# Patient Record
Sex: Female | Born: 1935 | Race: Black or African American | Hispanic: No | Smoking: Never smoker
Health system: Southern US, Community
[De-identification: ages and names within clinical notes are randomized; demographics above are authoritative.]

## PROBLEM LIST (undated history)

## (undated) DIAGNOSIS — R42 Dizziness and giddiness: Secondary | ICD-10-CM

## (undated) DIAGNOSIS — N3281 Overactive bladder: Secondary | ICD-10-CM

## (undated) DIAGNOSIS — Z8739 Personal history of other diseases of the musculoskeletal system and connective tissue: Secondary | ICD-10-CM

## (undated) DIAGNOSIS — Z8673 Personal history of transient ischemic attack (TIA), and cerebral infarction without residual deficits: Secondary | ICD-10-CM

## (undated) DIAGNOSIS — D649 Anemia, unspecified: Secondary | ICD-10-CM

## (undated) DIAGNOSIS — M1990 Unspecified osteoarthritis, unspecified site: Secondary | ICD-10-CM

## (undated) DIAGNOSIS — N390 Urinary tract infection, site not specified: Secondary | ICD-10-CM

## (undated) DIAGNOSIS — M109 Gout, unspecified: Secondary | ICD-10-CM

## (undated) DIAGNOSIS — B9620 Unspecified Escherichia coli [E. coli] as the cause of diseases classified elsewhere: Secondary | ICD-10-CM

## (undated) DIAGNOSIS — I639 Cerebral infarction, unspecified: Secondary | ICD-10-CM

## (undated) DIAGNOSIS — M1008 Idiopathic gout, vertebrae: Secondary | ICD-10-CM

## (undated) DIAGNOSIS — E119 Type 2 diabetes mellitus without complications: Secondary | ICD-10-CM

## (undated) DIAGNOSIS — I10 Essential (primary) hypertension: Secondary | ICD-10-CM

## (undated) DIAGNOSIS — Z87898 Personal history of other specified conditions: Secondary | ICD-10-CM

## (undated) DIAGNOSIS — E785 Hyperlipidemia, unspecified: Secondary | ICD-10-CM

## (undated) DIAGNOSIS — K5900 Constipation, unspecified: Secondary | ICD-10-CM

## (undated) DIAGNOSIS — M5430 Sciatica, unspecified side: Secondary | ICD-10-CM

## (undated) DIAGNOSIS — N189 Chronic kidney disease, unspecified: Secondary | ICD-10-CM

## (undated) HISTORY — PX: ABDOMINAL HYSTERECTOMY: SHX81

---

## 2008-03-15 ENCOUNTER — Emergency Department (HOSPITAL_COMMUNITY): Admission: EM | Admit: 2008-03-15 | Discharge: 2008-03-15 | Payer: Self-pay | Admitting: Emergency Medicine

## 2008-03-19 ENCOUNTER — Emergency Department (HOSPITAL_COMMUNITY): Admission: EM | Admit: 2008-03-19 | Discharge: 2008-03-19 | Payer: Self-pay | Admitting: Emergency Medicine

## 2009-09-18 ENCOUNTER — Ambulatory Visit: Payer: Self-pay | Admitting: Orthopedic Surgery

## 2009-09-18 DIAGNOSIS — Q762 Congenital spondylolisthesis: Secondary | ICD-10-CM

## 2009-09-18 DIAGNOSIS — M5430 Sciatica, unspecified side: Secondary | ICD-10-CM

## 2009-09-18 DIAGNOSIS — M4120 Other idiopathic scoliosis, site unspecified: Secondary | ICD-10-CM | POA: Insufficient documentation

## 2009-09-18 DIAGNOSIS — M479 Spondylosis, unspecified: Secondary | ICD-10-CM | POA: Insufficient documentation

## 2010-03-18 NOTE — Letter (Signed)
Summary: *Orthopedic Consult Note  [facility]  [address] [place] [address]  [place], [place] [address]   Phone: [phone]  Fax: [phone]    Re:    [name] [name] [facility] [name] DOB:    03-03-1917   Dear: Robyne Peers    Thank you for requesting that we see the above patient for consultation.  A copy of the detailed office note will be sent under separate cover, for your review.  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  I would recommend that you treat her with pain medication as needed I would start off with a light pain relievers such as Tylenol with codeine monitor for constipation.  If this is unsuccessful then I would recommend perhaps and Ultracet.  If she continues to have problems an MRI of her back and referral to a neurosurgeon would be proper.       Thank you for this opportunity to look after your patient.  Sincerely,   Terrance Mass. MD.

## 2010-03-18 NOTE — Assessment & Plan Note (Signed)
Summary: LEFT HIP PAIN BRINGING XR FILM,NOTES/MCR/MCD/BSF   Vital Signs:  Patient profile:   77 year old female Height:      63 inches Weight:      159 pounds Pulse rate:   76 / minute Resp:     16 per minute  Visit Type:  new patient Referring Marjan Rosman:  [facility]. Med. Primary Giacomo Valone:  Heloise Beecham. Med.  CC:  hip pain.  History of Present Illness: 77 year old female complains of LEFT hip pain radiating from her back into her LEFT leg associated with weakness but no numbness.  She describes a throbbing aching pain which is 9/10 and came on suddenly.  She's had no physical therapy; she denies any injury; she has a history of osteoarthritis in her back; she did try some Relafen it didn't help. She had an injection in her arm and it hasn't helped   Xrays brought on disc of left hip from 09/17/09.  Meds: Atenolol, Lipitor, Relafen, Colcrys, Allopurinol, Detrol, Amlodipine, Lasix, ASA.    Allergies (verified): 1)  ! Penicillin  Past History:  Past Medical History: htn cholesterol gout  Past Surgical History: hysterectomy  Family History: Family History of Arthritis  Social History: Patient is widowed.  retired no smoking no alcohol 1 cup of caffeine per day 8th grade ed.  Review of Systems Constitutional:  Denies weight loss, weight gain, fever, chills, and fatigue. Cardiovascular:  Denies chest pain, palpitations, fainting, and murmurs. Respiratory:  Denies short of breath, wheezing, couch, tightness, pain on inspiration, and snoring . Gastrointestinal:  Denies heartburn, nausea, vomiting, diarrhea, constipation, and blood in your stools. Genitourinary:  Denies frequency, urgency, difficulty urinating, painful urination, flank pain, and bleeding in urine. Neurologic:  Denies numbness, tingling, unsteady gait, dizziness, tremors, and seizure. Musculoskeletal:  Complains of joint pain and swelling; denies instability, stiffness, redness, heat, and muscle  pain. Endocrine:  Denies excessive thirst, exessive urination, and heat or cold intolerance. Psychiatric:  Denies nervousness, depression, anxiety, and hallucinations. Skin:  Denies changes in the skin, poor healing, rash, itching, and redness. HEENT:  Denies blurred or double vision, eye pain, redness, and watering. Immunology:  Denies seasonal allergies, sinus problems, and allergic to bee stings. Hemoatologic:  Denies easy bleeding and brusing.  Physical Exam  Additional Exam:  I had to examine the patient and her chair she is a thin elderly female vital signs recorded on are stable  Her ulcer perfusion were normal there was no tenderness in the lower extremities  Her skin had multiple spots as an blockages but no rashes on the legs  She had normal sensation zero reflexes at the knee and ankle they were equal bilaterally she was awake alert and oriented x3  She ambulated with assistive device although normally she walks independently  She only had 90 of knee flexion significant crepitus and pain in both knees strength was normal in her lower extremities her knees were stable her hips are stable  there was also normal range of motion in both hips with no pain  Her lumbar spine was tender LEFT gluteal region was tender to straight leg raise seated position that was positive.  Lasegue's sign positive as well.   Impression & Recommendations:  Problem # 1:  SPONDYLOSIS (ICD-721.90) Assessment New  Orders: New Patient Level III ([address]) Lumbosacral Spine ,2/3 views (72100)  Problem # 2:  SPONDYLOLITHESIS (UEA-540.98) Assessment: New  Orders: New Patient Level III ([address]) Lumbosacral Spine ,2/3 views (72100)  Problem # 3:  SCOLIOSIS-IDIOPATHIC (ICD-737.30)  Assessment: New  Orders: New Patient Level III ([address]) Lumbosacral Spine ,2/3 views (72100)  Problem # 4:  SCIATICA (ICD-724.3) Assessment: New  radiographs were obtained of her lumbar spine and she brought a hip  x-ray with her which was normal  The lumbar spine film showed the scoliosis the spondylolisthesis the spondylosis and a degenerative disc disease with a grade 1 slip at L4 and 5  Orders: New Patient Level III ([address]) Lumbosacral Spine ,2/3 views (72100)  Patient Instructions: 1)  Refer back to primary care  2)  Rec neurontin 100 mg three times a day  3)  prednisone dose pack x 12 days

## 2010-03-18 NOTE — Letter (Signed)
Summary: History form  History form   Imported By: Jacklynn Ganong 09/23/2009 11:43:02  _____________________________________________________________________  External Attachment:    Type:   Image     Comment:   External Document

## 2011-02-19 ENCOUNTER — Encounter: Payer: Self-pay | Admitting: Emergency Medicine

## 2011-02-19 ENCOUNTER — Emergency Department (HOSPITAL_COMMUNITY): Payer: Medicare Other

## 2011-02-19 ENCOUNTER — Other Ambulatory Visit: Payer: Self-pay

## 2011-02-19 ENCOUNTER — Inpatient Hospital Stay (HOSPITAL_COMMUNITY)
Admission: EM | Admit: 2011-02-19 | Discharge: 2011-02-23 | DRG: 066 | Disposition: A | Discharge status: Skilled Nursing Facility | Payer: Medicare Other | Attending: Internal Medicine | Admitting: Internal Medicine

## 2011-02-19 DIAGNOSIS — R42 Dizziness and giddiness: Secondary | ICD-10-CM | POA: Diagnosis present

## 2011-02-19 DIAGNOSIS — E119 Type 2 diabetes mellitus without complications: Secondary | ICD-10-CM

## 2011-02-19 DIAGNOSIS — E1151 Type 2 diabetes mellitus with diabetic peripheral angiopathy without gangrene: Secondary | ICD-10-CM | POA: Diagnosis present

## 2011-02-19 DIAGNOSIS — E1169 Type 2 diabetes mellitus with other specified complication: Secondary | ICD-10-CM | POA: Diagnosis present

## 2011-02-19 DIAGNOSIS — R001 Bradycardia, unspecified: Secondary | ICD-10-CM | POA: Diagnosis present

## 2011-02-19 DIAGNOSIS — M109 Gout, unspecified: Secondary | ICD-10-CM | POA: Diagnosis present

## 2011-02-19 DIAGNOSIS — I6350 Cerebral infarction due to unspecified occlusion or stenosis of unspecified cerebral artery: Principal | ICD-10-CM | POA: Diagnosis present

## 2011-02-19 DIAGNOSIS — I498 Other specified cardiac arrhythmias: Secondary | ICD-10-CM | POA: Diagnosis present

## 2011-02-19 DIAGNOSIS — I999 Unspecified disorder of circulatory system: Secondary | ICD-10-CM | POA: Diagnosis present

## 2011-02-19 DIAGNOSIS — I639 Cerebral infarction, unspecified: Secondary | ICD-10-CM | POA: Diagnosis present

## 2011-02-19 DIAGNOSIS — I672 Cerebral atherosclerosis: Secondary | ICD-10-CM | POA: Diagnosis present

## 2011-02-19 DIAGNOSIS — Z8673 Personal history of transient ischemic attack (TIA), and cerebral infarction without residual deficits: Secondary | ICD-10-CM

## 2011-02-19 DIAGNOSIS — I10 Essential (primary) hypertension: Secondary | ICD-10-CM | POA: Diagnosis present

## 2011-02-19 HISTORY — DX: Unspecified osteoarthritis, unspecified site: M19.90

## 2011-02-19 HISTORY — DX: Idiopathic gout, vertebrae: M10.08

## 2011-02-19 HISTORY — DX: Type 2 diabetes mellitus without complications: E11.9

## 2011-02-19 HISTORY — DX: Essential (primary) hypertension: I10

## 2011-02-19 HISTORY — DX: Personal history of transient ischemic attack (TIA), and cerebral infarction without residual deficits: Z86.73

## 2011-02-19 LAB — COMPREHENSIVE METABOLIC PANEL
ALT: 70 U/L — ABNORMAL HIGH (ref 0–35)
AST: 68 U/L — ABNORMAL HIGH (ref 0–37)
Albumin: 4.4 g/dL (ref 3.5–5.2)
Alkaline Phosphatase: 134 U/L — ABNORMAL HIGH (ref 39–117)
BUN: 17 mg/dL (ref 6–23)
CO2: 27 mEq/L (ref 19–32)
Calcium: 10.3 mg/dL (ref 8.4–10.5)
Chloride: 104 mEq/L (ref 96–112)
Creatinine, Ser: 0.92 mg/dL (ref 0.50–1.10)
GFR calc Af Amer: 61 mL/min — ABNORMAL LOW (ref >90)
GFR calc non Af Amer: 53 mL/min — ABNORMAL LOW (ref >90)
Glucose, Bld: 139 mg/dL — ABNORMAL HIGH (ref 70–99)
Potassium: 3.8 mEq/L (ref 3.5–5.1)
Sodium: 139 mEq/L (ref 135–145)
Total Bilirubin: 0.7 mg/dL (ref 0.3–1.2)
Total Protein: 8.1 g/dL (ref 6.0–8.3)

## 2011-02-19 LAB — CARDIAC PANEL(CRET KIN+CKTOT+MB+TROPI)
CK, MB: 6.7 ng/mL (ref 0.3–4.0)
Relative Index: 3.1 — ABNORMAL HIGH (ref 0.0–2.5)
Total CK: 216 U/L — ABNORMAL HIGH (ref 7–177)
Troponin I: <0.3 ng/mL (ref <0.30)

## 2011-02-19 LAB — DIFFERENTIAL
Basophils Absolute: 0 10*3/uL (ref 0.0–0.1)
Basophils Relative: 0 % (ref 0–1)
Eosinophils Absolute: 0 10*3/uL (ref 0.0–0.7)
Neutro Abs: 3.6 10*3/uL (ref 1.7–7.7)
Neutrophils Relative %: 80 % — ABNORMAL HIGH (ref 43–77)
Smear Review: DECREASED

## 2011-02-19 LAB — CBC
HCT: 35.6 % — ABNORMAL LOW (ref 36.0–46.0)
Hemoglobin: 11.8 g/dL — ABNORMAL LOW (ref 12.0–15.0)
MCH: 27.6 pg (ref 26.0–34.0)
MCHC: 33.1 g/dL (ref 30.0–36.0)
MCV: 83.2 fL (ref 78.0–100.0)
Platelets: 115 10*3/uL — ABNORMAL LOW (ref 150–400)
RBC: 4.28 MIL/uL (ref 3.87–5.11)
RDW: 16.1 % — ABNORMAL HIGH (ref 11.5–15.5)
WBC: 4.6 10*3/uL (ref 4.0–10.5)

## 2011-02-19 MED ORDER — MECLIZINE HCL 12.5 MG PO TABS
25.0000 mg | ORAL_TABLET | Freq: Three times a day (TID) | ORAL | Status: DC | PRN
  Administered 2011-02-22 (×2): 25 mg via ORAL
  Filled 2011-02-19: qty 1
  Filled 2011-02-19: qty 2
  Filled 2011-02-19: qty 1

## 2011-02-19 MED ORDER — SODIUM CHLORIDE 0.9 % IV SOLN
Freq: Once | INTRAVENOUS | Status: AC
  Administered 2011-02-19: 12:00:00 via INTRAVENOUS

## 2011-02-19 MED ORDER — ASPIRIN 325 MG PO TABS
325.0000 mg | ORAL_TABLET | Freq: Every day | ORAL | Status: DC
  Administered 2011-02-19 – 2011-02-23 (×5): 325 mg via ORAL
  Filled 2011-02-19 (×5): qty 1

## 2011-02-19 MED ORDER — ENOXAPARIN SODIUM 40 MG/0.4ML ~~LOC~~ SOLN
40.0000 mg | SUBCUTANEOUS | Status: DC
  Administered 2011-02-19 – 2011-02-22 (×4): 40 mg via SUBCUTANEOUS
  Filled 2011-02-19 (×6): qty 0.4

## 2011-02-19 MED ORDER — GADOBENATE DIMEGLUMINE 529 MG/ML IV SOLN
15.0000 mL | Freq: Once | INTRAVENOUS | Status: AC | PRN
  Administered 2011-02-19: 15 mL via INTRAVENOUS

## 2011-02-19 MED ORDER — AMLODIPINE BESYLATE 5 MG PO TABS
10.0000 mg | ORAL_TABLET | Freq: Every day | ORAL | Status: DC
  Administered 2011-02-20 – 2011-02-23 (×4): 10 mg via ORAL
  Filled 2011-02-19 (×4): qty 2

## 2011-02-19 MED ORDER — FUROSEMIDE 20 MG PO TABS
20.0000 mg | ORAL_TABLET | Freq: Every day | ORAL | Status: DC
  Administered 2011-02-20 – 2011-02-23 (×4): 20 mg via ORAL
  Filled 2011-02-19 (×4): qty 1

## 2011-02-19 MED ORDER — BENAZEPRIL HCL 10 MG PO TABS
40.0000 mg | ORAL_TABLET | Freq: Every day | ORAL | Status: DC
  Administered 2011-02-20 – 2011-02-23 (×4): 40 mg via ORAL
  Filled 2011-02-19 (×4): qty 4
  Filled 2011-02-19: qty 1

## 2011-02-19 MED ORDER — ONDANSETRON HCL 4 MG/2ML IJ SOLN
4.0000 mg | Freq: Four times a day (QID) | INTRAMUSCULAR | Status: DC | PRN
  Administered 2011-02-19 – 2011-02-22 (×2): 4 mg via INTRAVENOUS
  Filled 2011-02-19 (×3): qty 2

## 2011-02-19 MED ORDER — SODIUM CHLORIDE 0.45 % IV SOLN
INTRAVENOUS | Status: AC
  Administered 2011-02-19: 18:00:00 via INTRAVENOUS

## 2011-02-19 MED ORDER — MECLIZINE HCL 12.5 MG PO TABS
25.0000 mg | ORAL_TABLET | Freq: Once | ORAL | Status: AC
  Administered 2011-02-19: 25 mg via ORAL
  Filled 2011-02-19: qty 2

## 2011-02-19 MED ORDER — CLOPIDOGREL BISULFATE 75 MG PO TABS
75.0000 mg | ORAL_TABLET | Freq: Every day | ORAL | Status: DC
  Administered 2011-02-19 – 2011-02-23 (×5): 75 mg via ORAL
  Filled 2011-02-19 (×5): qty 1

## 2011-02-19 MED ORDER — TRAMADOL HCL 50 MG PO TABS: 50.0000 mg | ORAL_TABLET | Freq: Four times a day (QID) | ORAL | Status: DC | PRN

## 2011-02-19 MED ORDER — TOLTERODINE TARTRATE ER 2 MG PO CP24
2.0000 mg | ORAL_CAPSULE | Freq: Every day | ORAL | Status: DC
  Administered 2011-02-20 – 2011-02-23 (×4): 2 mg via ORAL
  Filled 2011-02-19 (×6): qty 1

## 2011-02-19 NOTE — ED Notes (Signed)
Report given to [place] ,RN unit 300. Ready to accept patient.

## 2011-02-19 NOTE — ED Provider Notes (Addendum)
History   This chart was scribed for [name] Lennert, MD by Clarita Crane. The patient was seen in room APA10/APA10 and the patient's care was started at 10:57AM.   CSN: [id number]  Arrival date & time 02/19/11  1010   First MD Initiated Contact with Patient 02/19/11 1049      Chief Complaint  Patient presents with  . Dizziness  .     (Consider location/radiation/quality/duration/timing/severity/associated sxs/prior treatment) HPI [name] [name] is a 78 y.o. female who presents to the Emergency Department complaining of constant moderate dizziness described as the room spinning with associated nausea, vomiting onset last night and persistent since. Patient states she has had 2 episodes of vomiting since onset of symptoms. Reports dizziness is aggravated with standing. Denies abdominal pain, HA, chest pain, SOB. Patient with h/o HTN.   PCP-[place]  Past Medical History  Diagnosis Date  . Hypertension     Past Surgical History  Procedure Date  . Abdominal hysterectomy     No family history on file.  History  Substance Use Topics  . Smoking status: Never Smoker   . Smokeless tobacco: Not on file  . Alcohol Use: No    OB History    Grav Para Term Preterm Abortions TAB SAB Ect Mult Living                  Review of Systems  Constitutional: Negative for fatigue.  HENT: Negative for congestion, sinus pressure and ear discharge.   Eyes: Negative for discharge.  Respiratory: Negative for cough.   Cardiovascular: Negative for chest pain.  Gastrointestinal: Positive for nausea and vomiting. Negative for abdominal pain and diarrhea.  Genitourinary: Negative for frequency and hematuria.  Musculoskeletal: Negative for back pain.  Skin: Negative for rash.  Neurological: Positive for dizziness. Negative for seizures and headaches.  Hematological: Negative.   Psychiatric/Behavioral: Negative for hallucinations.    Allergies  Bee venom and Penicillins  Home Medications     Current Outpatient Rx  Name Route Sig Dispense Refill  . ALLOPURINOL 100 MG PO TABS Oral Take 300 mg by mouth at bedtime.      Marland Kitchen AMLODIPINE BESY-BENAZEPRIL HCL 10-40 MG PO CAPS Oral Take 1 capsule by mouth daily.      . ATENOLOL 100 MG PO TABS Oral Take 100 mg by mouth daily.      . COLCHICINE 0.6 MG PO TABS Oral Take 0.6 mg by mouth daily as needed. gout     . FUROSEMIDE 20 MG PO TABS Oral Take 20 mg by mouth daily.      . TOLTERODINE TARTRATE ER 2 MG PO CP24 Oral Take 2 mg by mouth daily.      . TRAMADOL HCL 50 MG PO TABS Oral Take 50 mg by mouth every 6 (six) hours as needed. pain       BP 149/55  Pulse 53  Temp(Src) 97.8 F (36.6 C) (Oral)  Resp 18  SpO2 98%  Physical Exam  Nursing note and vitals reviewed. Constitutional: She is oriented to person, place, and time. She appears well-developed and well-nourished. No distress.  HENT:  Head: Normocephalic and atraumatic.  Right Ear: External ear normal.       TMs normal bilaterally.   Eyes: EOM are normal. Pupils are equal, round, and reactive to light.  Neck: Neck supple. No tracheal deviation present.  Cardiovascular: Normal rate and regular rhythm.  Exam reveals no gallop and no friction rub.   No murmur heard. Pulmonary/Chest: Effort  normal. No respiratory distress. She has no wheezes. She has no rales.  Abdominal: Soft. Bowel sounds are normal. She exhibits no distension. There is no tenderness.  Musculoskeletal: Normal range of motion. She exhibits no tenderness.  Neurological: She is alert and oriented to person, place, and time. No sensory deficit.  Skin: Skin is warm and dry.  Psychiatric: She has a normal mood and affect. Her behavior is normal.    ED Course  Procedures (including critical care time)  DIAGNOSTIC STUDIES: Oxygen Saturation is 98% on room air, normal by my interpretation.    COORDINATION OF CARE: 11:01AM- Patient and family members informed of current plan for treatment.  1:51PM- Patient and  family members informed of lab and imaging results. Patient states dizziness is still persistent following administration of IVFs and 25mg  Antivert PO. Will consult with PCP/hospitalist for admission.   Labs Reviewed  CBC - Abnormal; Notable for the following:    Hemoglobin 11.8 (*)    HCT 35.6 (*)    RDW 16.1 (*)    Platelets 115 (*) PLATELET COUNT CONFIRMED BY SMEAR   All other components within normal limits  DIFFERENTIAL - Abnormal; Notable for the following:    Neutrophils Relative 80 (*)    Lymphs Abs 0.6 (*)    All other components within normal limits  COMPREHENSIVE METABOLIC PANEL - Abnormal; Notable for the following:    Glucose, Bld 139 (*)    AST 68 (*)    ALT 70 (*)    Alkaline Phosphatase 134 (*)    GFR calc non Af Amer 53 (*)    GFR calc Af Amer 61 (*)    All other components within normal limits  CARDIAC PANEL(CRET KIN+CKTOT+MB+TROPI) - Abnormal; Notable for the following:    Total CK 216 (*)    CK, MB 6.7 (*)    Relative Index 3.1 (*)    All other components within normal limits   Dg Chest 1 View  02/19/2011  *RADIOLOGY REPORT*  Clinical Data: Dizziness  CHEST - 1 VIEW  Comparison: None.  Findings: Borderline cardiomegaly noted.  No acute infiltrate or pleural effusion.  No pulmonary edema.  Atherosclerotic calcifications of thoracic aorta. Mild elevation of the right hemidiaphragm.  IMPRESSION: Borderline cardiomegaly.  No active disease.  Original Report Authenticated By: Natasha Mead, M.D.   Ct Head Wo Contrast  02/19/2011  *RADIOLOGY REPORT*  Clinical Data: Nausea vomiting with dizziness since last night. High blood pressure.  CT HEAD WITHOUT CONTRAST  Technique:  Contiguous axial images were obtained from the base of the skull through the vertex without contrast.  Comparison: None.  Findings: Moderate sized acute/subacute non hemorrhagic right cerebellar infarct involving the mid to inferior aspect of the right cerebellum.  Presently no associated hemorrhage or  significant mass effect however as this progresses the patient is at risk for these complications.  Prominent small vessel disease type changes.  No intracranial mass lesion detected on this unenhanced exam.  Global atrophy without hydrocephalus.  Moderate mucosal thickening right maxillary sinus with wall thickening suggesting chronic sinusitis.  Vascular calcifications.  IMPRESSION: Moderate sized acute/subacute non hemorrhagic right cerebellar infarct as noted above.  Critical Value/emergent results were called by telephone at the time of interpretation on 02/19/2011 at  at 12:34 p.m.  to  Dr. Estell Harpin., who verbally acknowledged these results.  Original Report Authenticated By: Fuller Canada, M.D.     1. Stroke       MDM      Date:  02/19/2011  Rate: 57  Rhythm: normal sinus rhythm  QRS Axis: left  Intervals: normal  ST/T Wave abnormalities: normal  Conduction Disutrbances:left bundle branch block  Narrative Interpretation:   Old EKG Reviewed: none available     The chart was scribed for me under my direct supervision.  I personally performed the history, physical, and medical decision making and all procedures in the evaluation of this patient.[name] Lennert, MD 02/19/11 1413  [name] Lennert, MD 02/19/11 [phone]

## 2011-02-19 NOTE — H&P (Signed)
PCP:   Reynolds Bowl, MD   Chief Complaint:  Dizziness  HPI: Patient is a 78 year old African American female past medical history of hypertension who woke up last night complaining of dizziness. She described it as lightheadedness with room spinning like sensation. It was worse when she sat up or tried to stand and better when she laid back down. She then went back to sleep and when she woke up this morning her symptoms persisted. She was feeling quite rough and when nothing changed, she went to the emergency room.  The emergency room she was noted to have relatively stable vitals with her blood pressure being mild to moderately elevated in the 140s to 150s. A CT scan of the head noted moderate sized acute/subacute nonhemorrhagic right cerebellar infarct. At this point it was felt best that the patient be admitted and further workup. [facility] hospitalists were called for admission. I've ordered an MRI and the patient was sent upstairs. Patient underwent her MRI which is return back confirming the moderate size infarct in the posterior inferior cerebral artery distribution but also concerning for a tiny acute nonhemorrhagic infarct in the posterior right opercular region raising the possibility of embolic disease. The MRA that was done was most noteworthy for a high grade focal stenosis of the distal A1 segment of the left ACA and mild to moderate narrowing of the proximal posterior cerebral artery and moderate to markedly tandem stenoses of the left PICA.  Review of Systems:  I saw the patient in her room, to some very tired and complained of dizziness which she described as room spinning when she was sitting up. Otherwise she is doing okay. She denied any headache, vision changes, dysphasia, chest pain, palpitations, shortness of breath, wheeze, cough, abdominal pain, hematuria, dysuria, constipation, diarrhea, focal extremity numbness or weakness or pain. Review systems otherwise negative. She describes  the dizziness as room spinning when she tries to sit up, worse with standing.  Past Medical History: Past Medical History  Diagnosis Date  . Hypertension   . Arthritis   . Gout spondylitis    Past Surgical History  Procedure Date  . Abdominal hysterectomy     Medications: Prior to Admission medications   Medication Sig Start Date End Date Taking? Authorizing Provider  allopurinol (ZYLOPRIM) 100 MG tablet Take 300 mg by mouth at bedtime.     Yes Historical Provider, MD  amLODipine-benazepril (LOTREL) 10-40 MG per capsule Take 1 capsule by mouth daily.     Yes Historical Provider, MD  atenolol (TENORMIN) 100 MG tablet Take 100 mg by mouth daily.     Yes Historical Provider, MD  colchicine 0.6 MG tablet Take 0.6 mg by mouth daily as needed. gout    Yes Historical Provider, MD  furosemide (LASIX) 20 MG tablet Take 20 mg by mouth daily.     Yes Historical Provider, MD  tolterodine (DETROL LA) 2 MG 24 hr capsule Take 2 mg by mouth daily.     Yes Historical Provider, MD  traMADol (ULTRAM) 50 MG tablet Take 50 mg by mouth every 6 (six) hours as needed. pain    Yes Historical Provider, MD    Allergies:   Allergies  Allergen Reactions  . Bee Venom   . Penicillins     Social History:  reports that she has never smoked. She has never used smokeless tobacco. She reports that she does not drink alcohol or use illicit drugs. The patient is normally at baseline able to participate in almost all  activities of daily living using a cane or walker. She lives at home with 2 of her sons.  Family History: Her dad had a stroke as did one of her brothers.  Physical Exam: Filed Vitals:   02/19/11 1200 02/19/11 1300 02/19/11 1400 02/19/11 1706  BP: 149/55 153/61 150/56 139/69  Pulse: 53 58 65 57  Temp:    97.6 F (36.4 C)  TempSrc:    Oral  Resp:  20 22 18   Height:    5\' 2"  (1.575 m)  Weight:    64.5 kg (142 lb 3.2 oz)  SpO2: 98% 96% 97% 91%   General: Alert and oriented x3, in no acute  distress, looks stated age, fatigued HEENT: Normocephalic, atraumatic, mucous membranes are dry, cranial nerves II through XII are intact Cardiovascular: Regular rate and rhythm, S1-S2, soft 2/6 holosystolic murmur Lungs: Clear to auscultation bilaterally Abdomen: Soft, nontender, nondistended, positive bowel sounds Extremities: No clubbing or cyanosis or edema. Neurological: Flexion extension and grip looked to be symmetric with no focal deficit. Upper is almost fully intact with 5 out of 5 bilaterally. Lower has about a 5 minus relatively equal. She has some downgoing of the left foot and it looks to be more of a localized foot drop which she said happened a few years ago. She attributed this to an old stroke, but her previous CT and MRI showed no evidence of an old stroke. She has downgoing Babinski's. Sensation appears intact. No dysmetria. Overall no other focal neurological deficits. When I sat her up, she notes some mild dizziness although overall it is improved from before. Standing really truly causes her room spinning like symptoms. I'm not able to elicit any nystagmus.   Labs on Admission:   [facility] 02/19/11 1133  NA 139  K 3.8  CL 104  CO2 27  GLUCOSE 139*  BUN 17  CREATININE 0.92  CALCIUM 10.3  MG --  PHOS --    Basename 02/19/11 1133  AST 68*  ALT 70*  ALKPHOS 134*  BILITOT 0.7  PROT 8.1  ALBUMIN 4.4    Basename 02/19/11 1133  WBC 4.6  NEUTROABS 3.6  HGB 11.8*  HCT 35.6*  MCV 83.2  PLT 115*    Basename 02/19/11 1133  CKTOTAL 216*  CKMB 6.7*  CKMBINDEX --  TROPONINI <0.30   Radiological Exams on Admission: Dg Chest 1 View 02/19/2011   IMPRESSION: Borderline cardiomegaly.  No active disease.     Ct Head Wo Contrast 02/19/2011   IMPRESSION: Moderate sized acute/subacute non hemorrhagic right cerebellar infarct as noted above.  Critical Value/emergent results were called by telephone at the time of interpretation on 02/19/2011 at  at 12:34 p.m.  to  Dr.  Estell Harpin., who verbally acknowledged these results.   Mr Angiogram and MRI of Head Wo Contrast 02/19/2011  MRI of head  IMPRESSION: Moderate size acute/subacute non hemorrhagic right mid to inferior cerebellar infarct (PICA distribution).  Local mass effect with mild narrowing of the fourth ventricle.  No hydrocephalus.  Tiny acute non hemorrhagic infarct posterior right opercular region.  This raises possibility of embolic disease.  Mild hemosiderin staining left parietal lobe may be related to the prior infarct or trauma.  Mild to slightly moderate small vessel disease type changes.  Global atrophy.  Right maxillary sinus moderate mucosal thickening.    MRA HEAD  Findings: Motion degraded exam.  Moderate irregularity with areas of narrowing and dilation involving portions of the pre cavernous, cavernous and supraclinoid aspect of the  internal carotid artery bilaterally.  High-grade focal stenosis distal A1 segment of the left anterior cerebral artery.  Middle cerebral artery moderate branch vessel irregularity.  Ectatic vertebral arteries and basilar artery.  Artifact versus narrowing of the distal vertebral arteries bilaterally.  Nonvisualization right PICA consistent with the patient's acute infarct.  Moderate to marked tandem stenoses left PICA.  Nonvisualization AICAs.  Moderate narrowing irregularity of the superior cerebellar artery bilaterally.  Moderate to marked narrowing proximal posterior cerebral artery. Moderate narrowing and  irregularity distal aspect of the posterior cerebral arteries bilaterally  No saccular aneurysm.  IMPRESSION: Prominent intracranial atherosclerotic type changes as detailed above including nonvisualization of the right PICA corresponding to the patient's above described acute/subacute right cerebellar infarct.  Original Report Authenticated By: Fuller Canada, M.D.   Assessment/Plan Present on Admission:  .HTN (hypertension): Continue all the medications except beta  blocker. See below.  .Dizziness: Felt to be secondary to cerebellar infarct. When necessary Antivert. Symptoms seem to be slightly improving. Ask physical and occupational to evaluate.  .Cerebellar infarct: MRI and MRA results review. We'll discuss with neurology. Concerned about the possibility of embolic disease and we'll check carotid Dopplers and echocardiogram. Even though this is not reflected in her home medications, she is on daily aspirin normally. Have added Plavix to this.  .Bradycardia: Checking a TSH. Holding her beta blocker.  After discussion with the patient, she is to be a full code.  We will respect these wishes.  I anticipate her length of stay to be 1-2 days based on findings, although her stenoses may lead to further intervention.  Time spent on this patient including examination and decision-making process: 55 minutes.  Hollice Espy [phone] 02/19/2011, 6:01 PM

## 2011-02-19 NOTE — ED Notes (Signed)
Patient c/o nausea and vomiting that started last night. Denies abdominal pain. Denies fever.

## 2011-02-19 NOTE — ED Notes (Signed)
Patient unable to do orthostatic v/s standing - patient stated she was too dizzy to stand

## 2011-02-19 NOTE — ED Notes (Signed)
Critical CKMB of 6.7 reported to MD

## 2011-02-19 NOTE — ED Notes (Signed)
Patient assisted with bathroom - bedpan used due to dizziness.

## 2011-02-19 NOTE — ED Notes (Signed)
Patient in MRI 

## 2011-02-20 ENCOUNTER — Inpatient Hospital Stay (HOSPITAL_COMMUNITY): Payer: Medicare Other

## 2011-02-20 DIAGNOSIS — I059 Rheumatic mitral valve disease, unspecified: Secondary | ICD-10-CM

## 2011-02-20 LAB — GLUCOSE, CAPILLARY
Glucose-Capillary: 130 mg/dL — ABNORMAL HIGH (ref 70–99)
Glucose-Capillary: 157 mg/dL — ABNORMAL HIGH (ref 70–99)
Glucose-Capillary: 87 mg/dL (ref 70–99)

## 2011-02-20 LAB — RAPID URINE DRUG SCREEN, HOSP PERFORMED
Barbiturates: NOT DETECTED
Benzodiazepines: NOT DETECTED
Cocaine: NOT DETECTED
Tetrahydrocannabinol: NOT DETECTED

## 2011-02-20 LAB — LIPID PANEL: Cholesterol: 100 mg/dL (ref 0–200)

## 2011-02-20 NOTE — Progress Notes (Signed)
*  PRELIMINARY RESULTS* Echocardiogram 2D Echocardiogram has been performed.  [name] [name] 02/20/2011, 11:32 AM

## 2011-02-20 NOTE — Progress Notes (Signed)
Subjective: Patient is feeling better. She still complains of some dizziness, much more noticeable when she stands up (such as today when she was working with physical therapy). Overall she does note that her dizziness is better than it was yesterday. She has no other complaints.  Objective: Weight change:   Intake/Output Summary (Last 24 hours) at 02/20/11 1441 Last data filed at 02/20/11 0440  Gross per 24 hour  Intake      0 ml  Output   1300 ml  Net  -1300 ml   BP 128/68  Pulse 66  Temp(Src) 97.8 F (36.6 C) (Oral)  Resp 18  Ht 5\' 2"  (1.575 m)  Wt 64.5 kg (142 lb 3.2 oz)  BMI 26.01 kg/m2  SpO2 92% General: Alert and oriented x3, no apparent distress, looks younger than stated age HEENT: Normocephalic, atraumatic, mucous membranes are moist Cardiovascular: Regular rate and rhythm, S1, S2, 2/6 systolic ejection murmur Lungs: Clear to auscultation Bilaterally Abdomen: Soft, nontender, nondistended, positive bowel sounds Extremities: No clubbing or cyanosis or edema Neurological: No focal deficits. Lab Results: Basic Metabolic Panel:  Basename 02/19/11 1133  NA 139  K 3.8  CL 104  CO2 27  GLUCOSE 139*  BUN 17  CREATININE 0.92  CALCIUM 10.3  MG --  PHOS --   Liver Function Tests:  Basename 02/19/11 1133  AST 68*  ALT 70*  ALKPHOS 134*  BILITOT 0.7  PROT 8.1  ALBUMIN 4.4   CBC:  Basename 02/19/11 1133  WBC 4.6  NEUTROABS 3.6  HGB 11.8*  HCT 35.6*  MCV 83.2  PLT 115*   Cardiac Enzymes:  Basename 02/19/11 1133  CKTOTAL 216*  CKMB 6.7*  CKMBINDEX --  TROPONINI <0.30   CBG:  Basename 02/20/11 1125 02/20/11 0812 02/20/11 0045  GLUCAP 178* 88 87   Hemoglobin A1C:  Basename 02/19/11 1133  HGBA1C 6.6*   Fasting Lipid Panel:  Basename 02/20/11 0542  CHOL 100  HDL 55  LDLCALC 30  TRIG 77  CHOLHDL 1.8  LDLDIRECT --   Thyroid Function Tests:  Basename 02/19/11 1133  TSH 0.216*  T4TOTAL --  FREET4 --  T3FREE --  THYROIDAB --    Ct  Head Wo Contrast 02/20/2011  *RADIOLOGY REPORT*  Clinical Data: Followup infarct. IMPRESSION: Expected evolution of right cerebellar infarct with mild increased associated mass effect upon the surrounding structures including the fourth ventricle.  No evidence of hydrocephalus or intracranial hemorrhage. Ct Head Wo Contrast  Medications: Scheduled Meds:   . sodium chloride   Intravenous STAT  . amLODipine  10 mg Oral Daily  . aspirin  325 mg Oral Daily  . benazepril  40 mg Oral Daily  . clopidogrel  75 mg Oral Q breakfast  . enoxaparin (LOVENOX) injection  40 mg Subcutaneous Q24H  . furosemide  20 mg Oral Daily  . tolterodine  2 mg Oral Daily   Continuous Infusions:  PRN Meds:.gadobenate dimeglumine, meclizine, ondansetron (ZOFRAN) IV, traMADol  Assessment/Plan: Patient Active Hospital Problem List: Cerebellar infarct (02/19/2011)  appreciate neurology followup. Monitor patient closely. So far. The only residual effect is her vertigo. No muscular skeletal or muscle weakness. She is somewhat overall decondition with her dizziness, physical therapy is recommending short-term skilled nursing. The patient is somewhat resistant, but her family want what's best for her and is one to convince her to do so. Will likely check a repeat head CT on Sunday. Patient was already on aspirin and we added Plavix.  HTN (hypertension) (02/19/2011)  well-controlled.  Continue Norvasc and benazepril. 10 Lasix.  Dizziness: Secondary to cerebellar infarct. Continue when necessary Antivert.  Bradycardia: Discontinued her beta blocker. Her heart rate has remained in the 60s. And stable.  LOS: 1 day   Hollice Espy 02/20/2011, 2:41 PM

## 2011-02-20 NOTE — Consult Note (Signed)
Reason for Consult: Referring Physician:  [name] is an 78 y.o. female.  HPI: See dictation  Past Medical History  Diagnosis Date  . Hypertension   . Arthritis   . Gout spondylitis     Past Surgical History  Procedure Date  . Abdominal hysterectomy     History reviewed. No pertinent family history.  Social History:  reports that she has never smoked. She has never used smokeless tobacco. She reports that she does not drink alcohol or use illicit drugs.  Allergies:  Allergies  Allergen Reactions  . Bee Venom   . Penicillins     Medications:  Prior to Admission medications   Medication Sig Start Date End Date Taking? Authorizing Provider  allopurinol (ZYLOPRIM) 100 MG tablet Take 300 mg by mouth at bedtime.     Yes Historical Provider, MD  amLODipine-benazepril (LOTREL) 10-40 MG per capsule Take 1 capsule by mouth daily.     Yes Historical Provider, MD  atenolol (TENORMIN) 100 MG tablet Take 100 mg by mouth daily.     Yes Historical Provider, MD  colchicine 0.6 MG tablet Take 0.6 mg by mouth daily as needed. gout    Yes Historical Provider, MD  furosemide (LASIX) 20 MG tablet Take 20 mg by mouth daily.     Yes Historical Provider, MD  tolterodine (DETROL LA) 2 MG 24 hr capsule Take 2 mg by mouth daily.     Yes Historical Provider, MD  traMADol (ULTRAM) 50 MG tablet Take 50 mg by mouth every 6 (six) hours as needed. pain    Yes Historical Provider, MD   Scheduled Meds:   . sodium chloride   Intravenous STAT  . sodium chloride   Intravenous Once  . amLODipine  10 mg Oral Daily  . aspirin  325 mg Oral Daily  . benazepril  40 mg Oral Daily  . clopidogrel  75 mg Oral Q breakfast  . enoxaparin (LOVENOX) injection  40 mg Subcutaneous Q24H  . furosemide  20 mg Oral Daily  . meclizine  25 mg Oral Once  . tolterodine  2 mg Oral Daily   Continuous Infusions:  PRN Meds:.gadobenate dimeglumine, meclizine, ondansetron (ZOFRAN) IV, traMADol   Results for orders placed  during the hospital encounter of 02/19/11 (from the past 48 hour(s))  CBC     Status: Abnormal   Collection Time   02/19/11 11:33 AM      Component Value Range Comment   WBC 4.6  4.0 - 10.5 (K/uL)    RBC 4.28  3.87 - 5.11 (MIL/uL)    Hemoglobin 11.8 (*) 12.0 - 15.0 (g/dL)    HCT 78.4 (*) 69.6 - 46.0 (%)    MCV 83.2  78.0 - 100.0 (fL)    MCH 27.6  26.0 - 34.0 (pg)    MCHC 33.1  30.0 - 36.0 (g/dL)    RDW 29.5 (*) 28.4 - 15.5 (%)    Platelets 115 (*) 150 - 400 (K/uL) PLATELET COUNT CONFIRMED BY SMEAR  DIFFERENTIAL     Status: Abnormal   Collection Time   02/19/11 11:33 AM      Component Value Range Comment   Neutrophils Relative 80 (*) 43 - 77 (%)    Neutro Abs 3.6  1.7 - 7.7 (K/uL)    Lymphocytes Relative 14  12 - 46 (%)    Lymphs Abs 0.6 (*) 0.7 - 4.0 (K/uL)    Monocytes Relative 4  3 - 12 (%)    Monocytes Absolute  0.2  0.1 - 1.0 (K/uL)    Eosinophils Relative 0  0 - 5 (%)    Eosinophils Absolute 0.0  0.0 - 0.7 (K/uL)    Basophils Relative 0  0 - 1 (%)    Basophils Absolute 0.0  0.0 - 0.1 (K/uL)    Smear Review PLATELETS APPEAR DECREASED     COMPREHENSIVE METABOLIC PANEL     Status: Abnormal   Collection Time   02/19/11 11:33 AM      Component Value Range Comment   Sodium 139  135 - 145 (mEq/L)    Potassium 3.8  3.5 - 5.1 (mEq/L)    Chloride 104  96 - 112 (mEq/L)    CO2 27  19 - 32 (mEq/L)    Glucose, Bld 139 (*) 70 - 99 (mg/dL)    BUN 17  6 - 23 (mg/dL)    Creatinine, Ser 1.61  0.50 - 1.10 (mg/dL)    Calcium 09.6  8.4 - 10.5 (mg/dL)    Total Protein 8.1  6.0 - 8.3 (g/dL)    Albumin 4.4  3.5 - 5.2 (g/dL)    AST 68 (*) 0 - 37 (U/L)    ALT 70 (*) 0 - 35 (U/L)    Alkaline Phosphatase 134 (*) 39 - 117 (U/L)    Total Bilirubin 0.7  0.3 - 1.2 (mg/dL)    GFR calc non Af Amer 53 (*) >90 (mL/min)    GFR calc Af Amer 61 (*) >90 (mL/min)   CARDIAC PANEL(CRET KIN+CKTOT+MB+TROPI)     Status: Abnormal   Collection Time   02/19/11 11:33 AM      Component Value Range Comment   Total CK  216 (*) 7 - 177 (U/L)    CK, MB 6.7 (*) 0.3 - 4.0 (ng/mL)    Troponin I <0.30  <0.30 (ng/mL)    Relative Index 3.1 (*) 0.0 - 2.5    HEMOGLOBIN A1C     Status: Abnormal   Collection Time   02/19/11 11:33 AM      Component Value Range Comment   Hemoglobin A1C 6.6 (*) <5.7 (%)    Mean Plasma Glucose 143 (*) <117 (mg/dL)   TSH     Status: Abnormal   Collection Time   02/19/11 11:33 AM      Component Value Range Comment   TSH 0.216 (*) 0.350 - 4.500 (uIU/mL)   GLUCOSE, CAPILLARY     Status: Normal   Collection Time   02/20/11 12:45 AM      Component Value Range Comment   Glucose-Capillary 87  70 - 99 (mg/dL)    Comment 1 Notify RN     LIPID PANEL     Status: Normal   Collection Time   02/20/11  5:42 AM      Component Value Range Comment   Cholesterol 100  0 - 200 (mg/dL)    Triglycerides 77  <045 (mg/dL)    HDL 55  >40 (mg/dL)    Total CHOL/HDL Ratio 1.8      VLDL 15  0 - 40 (mg/dL)    LDL Cholesterol 30  0 - 99 (mg/dL)   URINE RAPID DRUG SCREEN (HOSP PERFORMED)     Status: Normal   Collection Time   02/20/11  5:49 AM      Component Value Range Comment   Opiates NONE DETECTED  NONE DETECTED     Cocaine NONE DETECTED  NONE DETECTED     Benzodiazepines NONE DETECTED  NONE DETECTED  Amphetamines NONE DETECTED  NONE DETECTED     Tetrahydrocannabinol NONE DETECTED  NONE DETECTED     Barbiturates NONE DETECTED  NONE DETECTED      Dg Chest 1 View  02/19/2011  *RADIOLOGY REPORT*  Clinical Data: Dizziness  CHEST - 1 VIEW  Comparison: None.  Findings: Borderline cardiomegaly noted.  No acute infiltrate or pleural effusion.  No pulmonary edema.  Atherosclerotic calcifications of thoracic aorta. Mild elevation of the right hemidiaphragm.  IMPRESSION: Borderline cardiomegaly.  No active disease.  Original Report Authenticated By: Natasha Mead, M.D.   Ct Head Wo Contrast  02/19/2011  *RADIOLOGY REPORT*  Clinical Data: Nausea vomiting with dizziness since last night. High blood pressure.  CT HEAD  WITHOUT CONTRAST  Technique:  Contiguous axial images were obtained from the base of the skull through the vertex without contrast.  Comparison: None.  Findings: Moderate sized acute/subacute non hemorrhagic right cerebellar infarct involving the mid to inferior aspect of the right cerebellum.  Presently no associated hemorrhage or significant mass effect however as this progresses the patient is at risk for these complications.  Prominent small vessel disease type changes.  No intracranial mass lesion detected on this unenhanced exam.  Global atrophy without hydrocephalus.  Moderate mucosal thickening right maxillary sinus with wall thickening suggesting chronic sinusitis.  Vascular calcifications.  IMPRESSION: Moderate sized acute/subacute non hemorrhagic right cerebellar infarct as noted above.  Critical Value/emergent results were called by telephone at the time of interpretation on 02/19/2011 at  at 12:34 p.m.  to  Dr. Estell Harpin., who verbally acknowledged these results.  Original Report Authenticated By: Fuller Canada, M.D.   Mr Angiogram Head Wo Contrast  02/19/2011  *RADIOLOGY REPORT*  Clinical Data:  Dizziness with nausea and vomiting.  MRI HEAD WITH AND WITHOUT CONTRAST MRA HEAD WITHOUT CONTRAST  Technique: Multiplanar, multiecho pulse sequences of the brain and surrounding structures were obtained according to standard protocol with and without intravenous contrast.  Angiographic images of the Circle of Willis were obtained using MRA technique without intravenous contrast.  Contrast: 14 ml MultiHance.  Comparison01/04/2011 CT.  No comparison MR.  MRI HEAD  Findings: Moderate size acute/subacute non hemorrhagic right mid to inferior cerebellar infarct (PICA distribution).  Local mass effect with mild narrowing of the fourth ventricle.  No hydrocephalus.  Tiny acute non hemorrhagic infarct posterior right opercular region.  This raises possibility of embolic disease.  Mild hemosiderin staining left  parietal lobe may be related to the prior infarct or trauma.  Scattered tiny areas of blood breakdown products may be related to episode hemorrhagic ischemia without large blood collection.  Mild to slightly moderate small vessel disease type changes.  Global atrophy.  No intracranial mass lesions or abnormal enhancement.  Transverse ligament hypertrophy.  Mild spinal stenosis C4-5 and C5- 6.  Right maxillary sinus moderate mucosal thickening.  IMPRESSION: Moderate size acute/subacute non hemorrhagic right mid to inferior cerebellar infarct (PICA distribution).  Local mass effect with mild narrowing of the fourth ventricle.  No hydrocephalus.  Tiny acute non hemorrhagic infarct posterior right opercular region.  This raises possibility of embolic disease.  Mild hemosiderin staining left parietal lobe may be related to the prior infarct or trauma.  Mild to slightly moderate small vessel disease type changes.  Global atrophy.  Right maxillary sinus moderate mucosal thickening.  MRA HEAD  Findings: Motion degraded exam.  Moderate irregularity with areas of narrowing and dilation involving portions of the pre cavernous, cavernous and supraclinoid aspect of the internal carotid  artery bilaterally.  High-grade focal stenosis distal A1 segment of the left anterior cerebral artery.  Middle cerebral artery moderate branch vessel irregularity.  Ectatic vertebral arteries and basilar artery.  Artifact versus narrowing of the distal vertebral arteries bilaterally.  Nonvisualization right PICA consistent with the patient's acute infarct.  Moderate to marked tandem stenoses left PICA.  Nonvisualization AICAs.  Moderate narrowing irregularity of the superior cerebellar artery bilaterally.  Moderate to marked narrowing proximal posterior cerebral artery. Moderate narrowing and  irregularity distal aspect of the posterior cerebral arteries bilaterally  No saccular aneurysm.  IMPRESSION: Prominent intracranial atherosclerotic type  changes as detailed above including nonvisualization of the right PICA corresponding to the patient's above described acute/subacute right cerebellar infarct.  Original Report Authenticated By: Fuller Canada, M.D.   Mr Laqueta Jean Wo Contrast  02/19/2011  *RADIOLOGY REPORT*  Clinical Data:  Dizziness with nausea and vomiting.  MRI HEAD WITH AND WITHOUT CONTRAST MRA HEAD WITHOUT CONTRAST  Technique: Multiplanar, multiecho pulse sequences of the brain and surrounding structures were obtained according to standard protocol with and without intravenous contrast.  Angiographic images of the Circle of Willis were obtained using MRA technique without intravenous contrast.  Contrast: 14 ml MultiHance.  Comparison01/04/2011 CT.  No comparison MR.  MRI HEAD  Findings: Moderate size acute/subacute non hemorrhagic right mid to inferior cerebellar infarct (PICA distribution).  Local mass effect with mild narrowing of the fourth ventricle.  No hydrocephalus.  Tiny acute non hemorrhagic infarct posterior right opercular region.  This raises possibility of embolic disease.  Mild hemosiderin staining left parietal lobe may be related to the prior infarct or trauma.  Scattered tiny areas of blood breakdown products may be related to episode hemorrhagic ischemia without large blood collection.  Mild to slightly moderate small vessel disease type changes.  Global atrophy.  No intracranial mass lesions or abnormal enhancement.  Transverse ligament hypertrophy.  Mild spinal stenosis C4-5 and C5- 6.  Right maxillary sinus moderate mucosal thickening.  IMPRESSION: Moderate size acute/subacute non hemorrhagic right mid to inferior cerebellar infarct (PICA distribution).  Local mass effect with mild narrowing of the fourth ventricle.  No hydrocephalus.  Tiny acute non hemorrhagic infarct posterior right opercular region.  This raises possibility of embolic disease.  Mild hemosiderin staining left parietal lobe may be related to the prior  infarct or trauma.  Mild to slightly moderate small vessel disease type changes.  Global atrophy.  Right maxillary sinus moderate mucosal thickening.  MRA HEAD  Findings: Motion degraded exam.  Moderate irregularity with areas of narrowing and dilation involving portions of the pre cavernous, cavernous and supraclinoid aspect of the internal carotid artery bilaterally.  High-grade focal stenosis distal A1 segment of the left anterior cerebral artery.  Middle cerebral artery moderate branch vessel irregularity.  Ectatic vertebral arteries and basilar artery.  Artifact versus narrowing of the distal vertebral arteries bilaterally.  Nonvisualization right PICA consistent with the patient's acute infarct.  Moderate to marked tandem stenoses left PICA.  Nonvisualization AICAs.  Moderate narrowing irregularity of the superior cerebellar artery bilaterally.  Moderate to marked narrowing proximal posterior cerebral artery. Moderate narrowing and  irregularity distal aspect of the posterior cerebral arteries bilaterally  No saccular aneurysm.  IMPRESSION: Prominent intracranial atherosclerotic type changes as detailed above including nonvisualization of the right PICA corresponding to the patient's above described acute/subacute right cerebellar infarct.  Original Report Authenticated By: Fuller Canada, M.D.    Review of Systems  Constitutional: Negative.   HENT: Negative for  hearing loss.   Eyes: Negative.   Cardiovascular: Negative.   Genitourinary: Negative.   Neurological: Negative for headaches.  Endo/Heme/Allergies: Negative.   Psychiatric/Behavioral: Negative.    Blood pressure 140/56, pulse 55, temperature 98.2 F (36.8 C), temperature source Oral, resp. rate 16, height 5\' 2"  (1.575 m), weight 64.5 kg (142 lb 3.2 oz), SpO2 93.00%. Physical Exam  Assessment/Plan: See dictation  [name] [name] 02/20/2011, 8:05 AM

## 2011-02-20 NOTE — Progress Notes (Signed)
Dr. Onalee Hua notified by page that patient had apparent 6 second pause on telemetry.  Patient denied any symptoms.  No response to page yet.

## 2011-02-20 NOTE — Progress Notes (Signed)
Physical Therapy Evaluation Patient Details Name: [name] [name] MRN: [id number] DOB: 09/25/1917 Today's Date: 02/20/2011  Problem List:  Patient Active Problem List  Diagnoses  . SPONDYLOSIS  . SCIATICA  . SCOLIOSIS-IDIOPATHIC  . SPONDYLOLITHESIS  . HTN (hypertension)  . Dizziness  . Cerebellar infarct  . Bradycardia    Past Medical History:  Past Medical History  Diagnosis Date  . Hypertension   . Arthritis   . Gout spondylitis    Past Surgical History:  Past Surgical History  Procedure Date  . Abdominal hysterectomy     PT Assessment/Plan/Recommendation PT Assessment Clinical Impression Statement: delightful lady with severe dizziness with sitting and standing, however she states that it is decreased since yesterday...she is able to sit independently, but unable to walk without max assist...recommend SNF at d/c PT Recommendation/Assessment: Patient will need skilled PT in the acute care venue PT Problem List: Decreased balance;Decreased activity tolerance;Decreased mobility;Decreased safety awareness;Decreased knowledge of precautions Barriers to Discharge: None PT Therapy Diagnosis : Abnormality of gait;Difficulty walking PT Plan PT Frequency: Min 4X/week PT Treatment/Interventions: Functional mobility training;Therapeutic activities;Balance training;Gait training;Patient/family education PT Recommendation Recommendations for Other Services: OT consult Follow Up Recommendations: Skilled nursing facility Equipment Recommended: Defer to next venue PT Goals  Acute Rehab PT Goals PT Goal Formulation: With patient Pt will go Supine/Side to Sit: with supervision Pt will go Sit to Supine/Side: with supervision Pt will go Sit to Stand: with supervision Pt will go Stand to Sit: with supervision Pt will Ambulate: 16 - 50 feet;with mod assist  PT Evaluation Precautions/Restrictions  Precautions Precautions: Fall Required Braces or Orthoses: No Restrictions Weight  Bearing Restrictions: No Prior Functioning  Home Living Lives With: Sheran Spine Help From: Family;Personal care attendant Type of Home: House Home Layout: One level Home Access: Ramped entrance Bathroom Shower/Tub: Engineer, manufacturing systems: Standard Bathroom Accessibility: Yes How Accessible: Accessible via walker Home Adaptive Equipment: Walker - rolling;Straight cane Prior Function Level of Independence: Independent with basic ADLs;Independent with gait;Independent with transfers Driving: No Vocation: Retired Producer, television/film/video: Awake/alert Overall Cognitive Status: Appears within functional limits for tasks assessed Orientation Level: Oriented X4 Sensation/Coordination Sensation Light Touch: Appears Intact Stereognosis: Not tested Hot/Cold: Not tested Proprioception: Not tested Coordination Gross Motor Movements are Fluid and Coordinated: Yes Fine Motor Movements are Fluid and Coordinated: Yes Finger Nose Finger Test: WNL Heel Shin Test: WNL Extremity Assessment RUE Assessment RUE Assessment: Within Functional Limits LUE Assessment LUE Assessment: Within Functional Limits RLE Assessment RLE Assessment: Within Functional Limits LLE Assessment LLE Assessment: Within Functional Limits Mobility (including Balance) Bed Mobility Bed Mobility: Yes Supine to Sit: 4: Min assist Sitting - Scoot to Edge of Bed: 4: Min assist Sit to Supine - Right: 4: Min assist Sit to Supine - Right Details (indicate cue type and reason): has dizziness upon sitting...does not improve with duration of sitting Transfers Transfers: Yes Sit to Stand: 4: Min assist Stand to Sit: 4: Min assist Ambulation/Gait Ambulation/Gait: Yes Ambulation/Gait Assistance: 2: Max assist Ambulation/Gait Assistance Details (indicate cue type and reason): dizziness increased when upright...has difficulty maintaining stance...unable to lift either foot off of floor Ambulation Distance  (Feet): 2 Feet Assistive device: Rolling walker Gait Pattern: Ataxic;Shuffle;Decreased stride length;Decreased hip/knee flexion - right;Decreased hip/knee flexion - left  Posture/Postural Control Posture/Postural Control: No significant limitations Balance Balance Assessed: Yes Static Sitting Balance Static Sitting - Level of Assistance: 7: Independent Dynamic Sitting Balance Dynamic Sitting - Level of Assistance: 6: Modified independent (Device/Increase time) Static Standing Balance Static Standing -  Level of Assistance: 2: Max assist Exercise    End of Session PT - End of Session Equipment Utilized During Treatment: Gait belt Activity Tolerance: Patient tolerated treatment well Patient left: in bed;with call bell in reach;with bed alarm set Nurse Communication: Mobility status for transfers General Behavior During Session: [facility] for tasks performed Cognition: [facility] for tasks performed  Konrad Penta 02/20/2011, 10:35 AM

## 2011-02-20 NOTE — Consult Note (Signed)
NAME:  [name] [name], [name] [name]                ACCOUNT NO.:  [id number]  MEDICAL RECORD NO.:  [id number]  LOCATION:  A306                          FACILITY:  [facility]  PHYSICIAN:  Tiaira Arambula A. Gerilyn Pilgrim, M.D. DATE OF BIRTH:  1917/12/10  DATE OF CONSULTATION:  02/20/2011 DATE OF DISCHARGE:                                CONSULTATION   HISTORY OF PRESENT ILLNESS:  The patient is a 78 year old black female who presented with the acute onset of dizziness.  The patient described the dizziness as being swimmy headed.  There is some spinning like sensation but mostly she reports dizzy headed and also she has had difficulty with gait problems.  She reports that she had a baseline stroke in the past which she presented almost similarly with gait ataxia, although she did not have the dizziness as she had now.  She reports that since her last stroke, she has had difficulty ambulating and uses a cane and walker.  PHYSICAL EXAMINATION:  GENERAL:  Pleasant, thin lady in no acute distress. HEENT:  Neck is supple.  Head is normocephalic and atraumatic. EXTREMITIES:  1+ edema. NEUROLOGIC:  Mentation:  She is awake and alert.  She is lucid and coherent.  Speech, language, and cognition are intact.  Cranial nerve evaluation:  Pupils are small, 2-3 mm but reactive.  Extraocular movements are full.  Visual fields are intact.  Facial muscle strength is symmetric.  Tongue is midline.  Uvula midline.  Shoulder shrug is normal.  Motor examination shows normal tone, bulk, and strength in upper extremities.  There is no pronator drift.  She has mild weakness in the legs approximately 4+.  Distal strength and dorsiflexion is good. Bulk and tone are also normal in the legs.  Coordination shows no dysmetria.  There is no tremor noted.  No parkinsonism.  Reflexes are normal in the upper extremities.  They are diminished at the knees. Plantars are both downgoing.  Sensation is normal to pain and light touch.  IMAGING STUDIES:  MRI  and CT scan of the brain are reviewed.  There is evidence of inferior cerebellar infarct on the right side.  There is some subtle evidence of mass effect. The fourth ventricle is opened, however.  ASSESSMENT:  Acute right cerebellar infarct.  Risk factors are hypertension, previous stroke and age.  She does seem to have little bit of evidence of mass effect and there is a possibility that the patient could deteriorate and require intervention such as neurosurgical intervention.  Clinically now, she actually looks great.  We would suggest q.4 neuro checks and repeat CT scan in the next 24-48 hours.  Continue with current antiplatelet agents.  She has a workup pending as well as an echo.     Mlissa Tamayo A. Gerilyn Pilgrim, M.D.     KAD/MEDQ  D:  02/20/2011  T:  02/20/2011  Job:  [id number]

## 2011-02-21 ENCOUNTER — Inpatient Hospital Stay (HOSPITAL_COMMUNITY): Payer: Medicare Other

## 2011-02-21 DIAGNOSIS — E1151 Type 2 diabetes mellitus with diabetic peripheral angiopathy without gangrene: Secondary | ICD-10-CM | POA: Diagnosis present

## 2011-02-21 LAB — HEMOGLOBIN A1C: Mean Plasma Glucose: 143 mg/dL — ABNORMAL HIGH (ref <117)

## 2011-02-21 LAB — GLUCOSE, CAPILLARY
Glucose-Capillary: 102 mg/dL — ABNORMAL HIGH (ref 70–99)
Glucose-Capillary: 133 mg/dL — ABNORMAL HIGH (ref 70–99)

## 2011-02-21 MED ORDER — INSULIN ASPART 100 UNIT/ML ~~LOC~~ SOLN: 0.0000 [IU] | Freq: Every day | SUBCUTANEOUS | Status: DC

## 2011-02-21 MED ORDER — INSULIN ASPART 100 UNIT/ML ~~LOC~~ SOLN
0.0000 [IU] | Freq: Three times a day (TID) | SUBCUTANEOUS | Status: DC
  Administered 2011-02-21 – 2011-02-22 (×2): 1 [IU] via SUBCUTANEOUS
  Filled 2011-02-21: qty 3

## 2011-02-21 MED ORDER — METFORMIN HCL 500 MG PO TABS
500.0000 mg | ORAL_TABLET | Freq: Every day | ORAL | Status: DC
  Administered 2011-02-22 – 2011-02-23 (×2): 500 mg via ORAL
  Filled 2011-02-21 (×2): qty 1

## 2011-02-21 NOTE — Progress Notes (Signed)
Subjective: Patient doing okay. Her dizziness is persisting, unchanged from yesterday. Most noticeably when she stands up. Otherwise she has no complaints.  Objective: Weight change:   Intake/Output Summary (Last 24 hours) at 02/21/11 1207 Last data filed at 02/20/11 2100  Gross per 24 hour  Intake    240 ml  Output    950 ml  Net   -710 ml   BP 135/69  Pulse 57  Temp(Src) 97.7 F (36.5 C) (Oral)  Resp 22  Ht 5\' 2"  (1.575 m)  Wt 64.5 kg (142 lb 3.2 oz)  BMI 26.01 kg/m2  SpO2 98% General: Alert and oriented x3, no apparent distress, looks younger than stated age HEENT: Normocephalic, atraumatic, mucous membranes are moist Cardiovascular: Regular rate and rhythm, S1, S2, 2/6 systolic ejection murmur Lungs: Clear to auscultation Bilaterally Abdomen: Soft, nontender, nondistended, positive bowel sounds Extremities: No clubbing or cyanosis or edema Neurological: No focal deficits. (Unchanged from yesterday.)  Lab Results: CBG:  Basename 02/21/11 1151 02/21/11 0716 02/20/11 2045 02/20/11 1657 02/20/11 1125 02/20/11 0812  GLUCAP 97 102* 130* 157* 178* 88   Hemoglobin A1C:  Basename 02/19/11 1133  HGBA1C 6.6*   Fasting Lipid Panel:  Basename 02/20/11 0542  CHOL 100  HDL 55  LDLCALC 30  TRIG 77  CHOLHDL 1.8  LDLDIRECT --   Thyroid Function Tests:  Basename 02/19/11 1133  TSH 0.216*  T4TOTAL --  FREET4 --  T3FREE --  THYROIDAB --   Echo: - Left ventricle: The cavity size was normal. Borderline LVH. Systolic function was normal. The estimated ejection fraction was in the range of 55% to 60%. Wall motion was normal; there were no regional wall motion abnormalities. - Aortic valve: Trivial regurgitation. - Mitral valve: Mild regurgitation. - Left atrium: The atrium was moderately dilated. - Right ventricle: The cavity size was normal. Wall thickness was mildly increased. - Right atrium: The atrium was mildly dilated. - Atrial septum: No defect or patent  foramen ovale was identified. - Pulmonary arteries: Systolic pressure was mildly increased. PA peak pressure: 53mm Hg (S).  Carotid Dopplers:Minor carotid atherosclerosis. No hemodynamically significant ICA  stenosis on either side.  Medications: Scheduled Meds:    . amLODipine  10 mg Oral Daily  . aspirin  325 mg Oral Daily  . benazepril  40 mg Oral Daily  . clopidogrel  75 mg Oral Q breakfast  . enoxaparin (LOVENOX) injection  40 mg Subcutaneous Q24H  . furosemide  20 mg Oral Daily  . tolterodine  2 mg Oral Daily   Continuous Infusions:  PRN Meds:.meclizine, ondansetron (ZOFRAN) IV, traMADol  Assessment/Plan: Patient Active Hospital Problem List: Cerebellar infarct (02/19/2011)  appreciate neurology followup. Monitor patient closely. The only residual symptom from her stroke is vertigo. The plan will be for patient to go to a short-term skilled nursing facility on Monday. In review of her workup her echo and carotid Dopplers are unremarkable. Her cholesterol panel is negative. She does have a new diagnosis of diabetes as her A1c does put her in the slightly diabetic range. In addition she possibly could be hyperthyroid as her TSH is slightly suppressed, although unless her free T4 and T3 are elevated I would not attribute anything to that given her acute illness. Will add metformin to her original in. Follow her CBGs to ensure that she is able to tolerate this and await thyroid studies.  HTN (hypertension) (02/19/2011)  well-controlled. Continue Norvasc and benazepril and Lasix.  Dizziness: Secondary to cerebellar infarct. Continue when necessary  Antivert.  Bradycardia: Discontinued her beta blocker. Her heart rate has remained in the 60s. And stable.  LOS: 2 days   [name] [name] 02/21/2011, 12:07 PM

## 2011-02-22 LAB — GLUCOSE, CAPILLARY
Glucose-Capillary: 92 mg/dL (ref 70–99)
Glucose-Capillary: 121 mg/dL — ABNORMAL HIGH (ref 70–99)
Glucose-Capillary: 96 mg/dL (ref 70–99)

## 2011-02-22 MED ORDER — METFORMIN HCL 500 MG PO TABS: 500.0000 mg | ORAL_TABLET | Freq: Two times a day (BID) | ORAL | Status: DC

## 2011-02-22 MED ORDER — POLYETHYLENE GLYCOL 3350 17 G PO PACK
17.0000 g | PACK | Freq: Every day | ORAL | Status: DC
  Administered 2011-02-22 – 2011-02-23 (×2): 17 g via ORAL
  Filled 2011-02-22 (×2): qty 1

## 2011-02-22 MED ORDER — CLOPIDOGREL BISULFATE 75 MG PO TABS: 75.0000 mg | ORAL_TABLET | Freq: Every day | ORAL | Status: AC

## 2011-02-22 MED ORDER — ASPIRIN 325 MG PO TABS: 325.0000 mg | ORAL_TABLET | Freq: Every day | ORAL | Status: AC

## 2011-02-22 MED ORDER — MECLIZINE HCL 25 MG PO TABS: 25.0000 mg | ORAL_TABLET | Freq: Three times a day (TID) | ORAL | Status: AC | PRN

## 2011-02-22 NOTE — Discharge Summary (Addendum)
DISCHARGE SUMMARY  [name] [name]  MR#: [id number]  DOB:August 16, 1917  Date of Admission: 02/19/2011 Date of Discharge: 02/23/2011  Attending Physician:KRISHNAN,SENDIL K  Patient's NWG:NFAOZHYQ,MVHQI, MD  Consults:Treatment Team:  Beryle Beams, MD-neurology  Discharge Diagnoses: Present on Admission:  .HTN (hypertension) .Dizziness .Cerebellar infarct .Bradycardia .DM type 2 causing vascular disease, not at goal    Current Discharge Medication List    START taking these medications   Details  aspirin 325 MG tablet Take 1 tablet (325 mg total) by mouth daily.    clopidogrel (PLAVIX) 75 MG tablet Take 1 tablet (75 mg total) by mouth daily with breakfast. Qty: 30 tablet, Refills: 0   Pepcid 20 mg one tablet by mouth daily   MiraLAX laxative 17 g added to 8 ounces of water or other beverage daily.   meclizine (ANTIVERT) 25 MG tablet Take 1 tablet (25 mg total) by mouth 3 (three) times daily as needed for dizziness or nausea. Qty: 90 tablet, Refills: 0    metFORMIN (GLUCOPHAGE) 500 MG tablet Take 1 tablet (500 mg total) by mouth 2 (two) times daily with a meal. Qty: 60 tablet, Refills: 0      CONTINUE these medications which have NOT CHANGED   Details  allopurinol (ZYLOPRIM) 100 MG tablet Take 300 mg by mouth at bedtime.      amLODipine-benazepril (LOTREL) 10-40 MG per capsule Take 1 capsule by mouth daily.      atenolol (TENORMIN) 100 MG tablet Take 100 mg by mouth daily.      colchicine 0.6 MG tablet Take 0.6 mg by mouth daily as needed. gout     furosemide (LASIX) 20 MG tablet Take 20 mg by mouth daily.      tolterodine (DETROL LA) 2 MG 24 hr capsule Take 2 mg by mouth daily.      traMADol (ULTRAM) 50 MG tablet Take 50 mg by mouth every 6 (six) hours as needed. pain         Hospital Course: Present on Admission:  .HTN (hypertension): This looks to be well controlled. Had had to hold her beta blocker because of bradycardia (see below).   .Dizziness: While  improved from initial presentation, she still has some persistent vertigo-like symptoms especially when standing. This is felt to be secondary to her cerebellar infarct. We are treating her with when necessary Antivert. Given her dizziness with standing and walking, his goal therapy is recommending short-term skilled nursing facility.  .Cerebellar infarct: Primary problem. Patient is a 78 year old [place] American female who is quite active and for the most part, is quite healthy. She presented with onset of dizziness starting the night before and looks to have suffered a cerebellar stroke. In review of her cranial artery disease, she has infarct of the right posterior inferior cerebral artery and high stenoses on that artery plus moderate to high stenoses on the left. In addition she had a small infarct of the right opercular region. The rest of her workup has noted relatively well-controlled blood pressure already. A new diagnosis of low-grade diabetes mellitus type 2 with an A1c of 6.8 and already excellent cholesterol panel with an LDL of 30. Likely this is all from small vessel disease. Her echocardiogram, carotid Dopplers have all been unremarkable. We'll await final evaluation by neurology on Monday before discharge.   .Bradycardia: The patient is on a beta blocker and have had to hold it. We checked a thyroid function panel which noted a slightly low TSH but normal free T4 and T3. Given  her acute illness, would favor rechecking the thyroid studies at a later time as they're unrevealing. Her primary care physician can followup her heart rate and determine if she needs to resume her beta blocker.   .DM type 2 causing vascular disease, not at goal: Patient has no previous history of diabetes. This was discovered during her stroke workup. Her hemoglobin A1c was found to be 6.8. I have started metformin 500 twice a day it is likely this will be the safest medicine for the patient to take not causing  hypoglycemia. She's been able tolerate this for at least the last 24 hours and her CBGs have been stable.  Day of Discharge BP 135/66  Pulse 62  Temp(Src) 97.3 F (36.3 C) (Oral)  Resp 17  Ht 5\' 2"  (1.575 m)  Wt 64.5 kg (142 lb 3.2 oz)  BMI 26.01 kg/m2  SpO2 97%  Physical Exam:  General: Alert and oriented x3, in no acute distress, looks stated age HEENT: Normocephalic, atraumatic, mucous members are moist Cardiovascular: Regular rate and rhythm S1-S2 soft 2/6 systolic ejection murmur Lungs:" Bilaterally Abdomen: Soft, nontender, nondistended, positive bowel sounds Extremities: No clubbing or cyanosis or edema Neurological: No focal deficits.  Results for orders placed during the hospital encounter of 02/19/11 (from the past 24 hour(s))  GLUCOSE, CAPILLARY     Status: Abnormal   Collection Time   02/21/11  4:30 PM      Component Value Range   Glucose-Capillary 133 (*) 70 - 99 (mg/dL)   Comment 1 Notify RN     Comment 2 Documented in Chart    GLUCOSE, CAPILLARY     Status: Abnormal   Collection Time   02/21/11  9:37 PM      Component Value Range   Glucose-Capillary 136 (*) 70 - 99 (mg/dL)  GLUCOSE, CAPILLARY     Status: Normal   Collection Time   02/22/11  7:23 AM      Component Value Range   Glucose-Capillary 92  70 - 99 (mg/dL)   Comment 1 Notify RN     Comment 2 Documented in Chart    GLUCOSE, CAPILLARY     Status: Abnormal   Collection Time   02/22/11 11:29 AM      Component Value Range   Glucose-Capillary 109 (*) 70 - 99 (mg/dL)   Comment 1 Documented in Chart      Disposition: Improved, being discharged to short-term skilled nursing facility   Follow-up Appts:  Follow-up with Reynolds Bowl, MD, PCP in 3-4 weeks.  Tests Needing Follow-up: None  Time spent in discharge (includes decision making & examination of pt): 45  Signed: KRISHNAN,SENDIL K 02/22/2011, 2:30 PM    Addendum: Atenolol was discontinued as stated above. Pepcid was added prophylactically  given that the patient is being treated with both aspirin and Plavix. MiraLAX laxative has been added as well for daily treatment of constipation. She is being discharged to [facility] skilled nursing facility today for ongoing rehabilitation following her stroke.

## 2011-02-23 LAB — GLUCOSE, CAPILLARY
Glucose-Capillary: 91 mg/dL (ref 70–99)
Glucose-Capillary: 109 mg/dL — ABNORMAL HIGH (ref 70–99)

## 2011-02-23 MED ORDER — FAMOTIDINE 20 MG PO TABS: 20.0000 mg | ORAL_TABLET | Freq: Every day | ORAL | Status: DC

## 2011-02-23 MED ORDER — POLYETHYLENE GLYCOL 3350 17 G PO PACK: 17.0000 g | PACK | Freq: Every day | ORAL | Status: AC

## 2011-02-23 NOTE — Progress Notes (Signed)
Pt d/c today by MD to SNF.  CSW presented bed offers and pt and family choose [facility].  Family to transport pt.  Facility aware and agreeable.  Karn Cassis

## 2011-02-23 NOTE — Progress Notes (Signed)
Subjective:  The patient is sitting up eating lunch. She has no complaints. Her family is in the room. Questions are answered.  Objective: Vital signs in last 24 hours: Filed Vitals:   02/22/11 2146 02/23/11 0204 02/23/11 0510 02/23/11 1012  BP: 120/71 126/69 125/71 121/61  Pulse: 61 65 69 60  Temp: 97.6 F (36.4 C) 98.7 F (37.1 C) 98.6 F (37 C) 97.9 F (36.6 C)  TempSrc: Oral Oral Oral Oral  Resp: 16 16 16 16   Height:      Weight:   62 kg (136 lb 11 oz)   SpO2: 95% 92% 97% 97%    Intake/Output Summary (Last 24 hours) at 02/23/11 1215 Last data filed at 02/23/11 0700  Gross per 24 hour  Intake   1022 ml  Output   1700 ml  Net   -678 ml    Weight change:   Deferred as the patient is eating lunch. In general, she is a pleasant 78 year old alert African American woman who is sitting up in bed eating lunch. She is in no acute distress. See Dr. Chancy Milroy discharge summary from yesterday.  Lab Results: Basic Metabolic Panel: No results found for this basename: NA:2,K:2,CL:2,CO2:2,GLUCOSE:2,BUN:2,CREATININE:2,CALCIUM:2,MG:2,PHOS:2 in the last 72 hours Liver Function Tests: No results found for this basename: AST:2,ALT:2,ALKPHOS:2,BILITOT:2,PROT:2,ALBUMIN:2 in the last 72 hours No results found for this basename: LIPASE:2,AMYLASE:2 in the last 72 hours No results found for this basename: AMMONIA:2 in the last 72 hours CBC: No results found for this basename: WBC:2,NEUTROABS:2,HGB:2,HCT:2,MCV:2,PLT:2 in the last 72 hours Cardiac Enzymes: No results found for this basename: CKTOTAL:3,CKMB:3,CKMBINDEX:3,TROPONINI:3 in the last 72 hours BNP: No results found for this basename: PROBNP:3 in the last 72 hours D-Dimer: No results found for this basename: DDIMER:2 in the last 72 hours CBG:  Basename 02/23/11 0802 02/22/11 2150 02/22/11 1649 02/22/11 1129 02/22/11 0723 02/21/11 2137  GLUCAP 91 96 121* 109* 92 136*   Hemoglobin A1C: No results found for this basename: HGBA1C in  the last 72 hours Fasting Lipid Panel: No results found for this basename: CHOL,HDL,LDLCALC,TRIG,CHOLHDL,LDLDIRECT in the last 72 hours Thyroid Function Tests: No results found for this basename: TSH,T4TOTAL,FREET4,T3FREE,THYROIDAB in the last 72 hours Anemia Panel: No results found for this basename: VITAMINB12,FOLATE,FERRITIN,TIBC,IRON,RETICCTPCT in the last 72 hours Coagulation: No results found for this basename: LABPROT:2,INR:2 in the last 72 hours Urine Drug Screen: Drugs of Abuse     Component Value Date/Time   LABOPIA NONE DETECTED 02/20/2011 0549   COCAINSCRNUR NONE DETECTED 02/20/2011 0549   LABBENZ NONE DETECTED 02/20/2011 0549   AMPHETMU NONE DETECTED 02/20/2011 0549   THCU NONE DETECTED 02/20/2011 0549   LABBARB NONE DETECTED 02/20/2011 0549    Alcohol Level:   Micro: No results found for this or any previous visit (from the past 240 hour(s)).  Studies/Results: No results found.  Medications: I have reviewed the patient's current medications.  Assessment: Principal Problem:  *Cerebellar infarct Active Problems:  HTN (hypertension)  Dizziness  Bradycardia  DM type 2 causing vascular disease, not at goal  She is stable and in improved condition. Per Dr. Rito Ehrlich, atenolol was supposed to be discontinued. This will be corrected and discontinued on his discharge summary. Also, we'll add Pepcid prophylactically given that aspirin and Plavix were prescribed for treatment of her stroke and severe cerebral artery stenotic disease.  Plan: As above. Discharged the patient to [facility] skilled nursing facility for ongoing rehabilitation today.   LOS: 4 days   Marice Guidone 02/23/2011, 12:15 PM

## 2011-02-23 NOTE — Progress Notes (Signed)
Patient discharged to [facility] report called,and given to [facility] LPN.Family at bedside.No C/O pain or discomfort noted.Accompanied by staff to an awaiting vehicle.

## 2011-02-23 NOTE — Progress Notes (Signed)
Occupational Therapy Evaluation Patient Details Name: [name] [name] MRN: [id number] DOB: 08/04/1917 Today's Date: 02/20/2011  Problem List:  Patient Active Problem List  Diagnoses  . SPONDYLOSIS  . SCIATICA  . SCOLIOSIS-IDIOPATHIC  . SPONDYLOLITHESIS  . HTN (hypertension)  . Dizziness  . Cerebellar infarct  . Bradycardia  . DM type 2 causing vascular disease, not at goal    Past Medical History:  Past Medical History  Diagnosis Date  . Hypertension   . Arthritis   . Gout spondylitis    Past Surgical History:  Past Surgical History  Procedure Date  . Abdominal hysterectomy     OT Assessment/Plan/Recommendation: Patient presents with: decreased trunk control, dizziness decreased transfers and mobility and impaired ADL's secondary to weakness and decreased trunk control. Skilled OT acute venue to work on decreased activity tolerance, impaired balance and safety awareness.  OT  Min 2 x a week to address trunk control, transfers, strength and balance deficits. SNF recommended upon discharge.   OT Goals 1. Groom self with set up sitting EOB unsupported. 2. Upper body dressing with set up sitting in bed unsupported. 3. Lower body dressing set up in chair putting on socks and shoes. 4. Transfer Min A stand pivot transfer chair <> bed.   OT Evaluation Eating NA Grooming Min A similated sitting in bed UB Bathing NA LB Bathing NA Upper body Dressing Min A due to decreased midline orientation and trunk control while sitting edge of bed. Lower Body dressing Max A simulated due to decreased trunk control. Max assist transfers bed side commode to bed. Stand pivot. Max assist clothing care.  Tub transfer and toilet hygiene NA Cognition Awake alert and Ox4 WFL. Sensation grossly intact. Precautions Precautions: Fall Required Braces or Orthoses: No Restrictions Weight Bearing Restrictions: No Prior Functioning: Patient lived with son's and had a personal care attendant.         Lisa Roca 02/20/2011, 9:53 AM

## 2011-03-09 ENCOUNTER — Encounter (HOSPITAL_COMMUNITY): Payer: Self-pay | Admitting: *Deleted

## 2011-03-09 ENCOUNTER — Inpatient Hospital Stay (HOSPITAL_COMMUNITY)
Admission: EM | Admit: 2011-03-09 | Discharge: 2011-03-13 | DRG: 683 | Disposition: A | Discharge status: Home / Self Care | Payer: Medicare Other | Attending: Internal Medicine | Admitting: Internal Medicine

## 2011-03-09 ENCOUNTER — Inpatient Hospital Stay (HOSPITAL_COMMUNITY): Payer: Medicare Other

## 2011-03-09 ENCOUNTER — Emergency Department (HOSPITAL_COMMUNITY): Payer: Medicare Other

## 2011-03-09 ENCOUNTER — Other Ambulatory Visit: Payer: Self-pay

## 2011-03-09 DIAGNOSIS — I10 Essential (primary) hypertension: Secondary | ICD-10-CM | POA: Diagnosis present

## 2011-03-09 DIAGNOSIS — I639 Cerebral infarction, unspecified: Secondary | ICD-10-CM

## 2011-03-09 DIAGNOSIS — N179 Acute kidney failure, unspecified: Principal | ICD-10-CM | POA: Diagnosis present

## 2011-03-09 DIAGNOSIS — D649 Anemia, unspecified: Secondary | ICD-10-CM | POA: Diagnosis present

## 2011-03-09 DIAGNOSIS — R112 Nausea with vomiting, unspecified: Secondary | ICD-10-CM | POA: Diagnosis present

## 2011-03-09 DIAGNOSIS — I498 Other specified cardiac arrhythmias: Secondary | ICD-10-CM | POA: Diagnosis present

## 2011-03-09 DIAGNOSIS — E119 Type 2 diabetes mellitus without complications: Secondary | ICD-10-CM | POA: Diagnosis present

## 2011-03-09 DIAGNOSIS — Q762 Congenital spondylolisthesis: Secondary | ICD-10-CM

## 2011-03-09 DIAGNOSIS — M479 Spondylosis, unspecified: Secondary | ICD-10-CM

## 2011-03-09 DIAGNOSIS — E875 Hyperkalemia: Secondary | ICD-10-CM | POA: Diagnosis present

## 2011-03-09 DIAGNOSIS — M5430 Sciatica, unspecified side: Secondary | ICD-10-CM

## 2011-03-09 DIAGNOSIS — I959 Hypotension, unspecified: Secondary | ICD-10-CM | POA: Diagnosis present

## 2011-03-09 DIAGNOSIS — Z8673 Personal history of transient ischemic attack (TIA), and cerebral infarction without residual deficits: Secondary | ICD-10-CM

## 2011-03-09 DIAGNOSIS — M4120 Other idiopathic scoliosis, site unspecified: Secondary | ICD-10-CM

## 2011-03-09 DIAGNOSIS — R42 Dizziness and giddiness: Secondary | ICD-10-CM

## 2011-03-09 DIAGNOSIS — Z66 Do not resuscitate: Secondary | ICD-10-CM | POA: Diagnosis present

## 2011-03-09 DIAGNOSIS — D689 Coagulation defect, unspecified: Secondary | ICD-10-CM | POA: Diagnosis present

## 2011-03-09 DIAGNOSIS — R001 Bradycardia, unspecified: Secondary | ICD-10-CM | POA: Diagnosis present

## 2011-03-09 DIAGNOSIS — E1151 Type 2 diabetes mellitus with diabetic peripheral angiopathy without gangrene: Secondary | ICD-10-CM | POA: Diagnosis present

## 2011-03-09 HISTORY — DX: Type 2 diabetes mellitus without complications: E11.9

## 2011-03-09 HISTORY — DX: Dizziness and giddiness: R42

## 2011-03-09 HISTORY — DX: Personal history of transient ischemic attack (TIA), and cerebral infarction without residual deficits: Z86.73

## 2011-03-09 HISTORY — DX: Sciatica, unspecified side: M54.30

## 2011-03-09 LAB — URINALYSIS, ROUTINE W REFLEX MICROSCOPIC
Nitrite: NEGATIVE
Specific Gravity, Urine: >1.03 — ABNORMAL HIGH (ref 1.005–1.030)
Urobilinogen, UA: 0.2 mg/dL (ref 0.0–1.0)
pH: 5 (ref 5.0–8.0)

## 2011-03-09 LAB — CBC
Hemoglobin: 11 g/dL — ABNORMAL LOW (ref 12.0–15.0)
MCH: 28.4 pg (ref 26.0–34.0)
Platelets: 215 10*3/uL (ref 150–400)
RBC: 3.87 MIL/uL (ref 3.87–5.11)
WBC: 6.4 10*3/uL (ref 4.0–10.5)

## 2011-03-09 LAB — COMPREHENSIVE METABOLIC PANEL
ALT: 15 U/L (ref 0–35)
AST: 21 U/L (ref 0–37)
Albumin: 3.5 g/dL (ref 3.5–5.2)
CO2: 19 mEq/L (ref 19–32)
Calcium: 9.9 mg/dL (ref 8.4–10.5)
Chloride: 95 mEq/L — ABNORMAL LOW (ref 96–112)
GFR calc non Af Amer: 4 mL/min — ABNORMAL LOW (ref >90)
Sodium: 131 mEq/L — ABNORMAL LOW (ref 135–145)
Total Bilirubin: 0.5 mg/dL (ref 0.3–1.2)

## 2011-03-09 LAB — URINE MICROSCOPIC-ADD ON

## 2011-03-09 LAB — TROPONIN I: Troponin I: <0.3 ng/mL (ref <0.30)

## 2011-03-09 LAB — PROTIME-INR
INR: 1.36 (ref 0.00–1.49)
Prothrombin Time: 17 seconds — ABNORMAL HIGH (ref 11.6–15.2)

## 2011-03-09 LAB — GLUCOSE, CAPILLARY: Glucose-Capillary: 137 mg/dL — ABNORMAL HIGH (ref 70–99)

## 2011-03-09 LAB — CARDIAC PANEL(CRET KIN+CKTOT+MB+TROPI): Relative Index: INVALID (ref 0.0–2.5)

## 2011-03-09 MED ORDER — SODIUM CHLORIDE 0.9 % IJ SOLN
INTRAMUSCULAR | Status: AC
  Filled 2011-03-09: qty 3

## 2011-03-09 MED ORDER — MECLIZINE HCL 12.5 MG PO TABS: 12.5000 mg | ORAL_TABLET | Freq: Three times a day (TID) | ORAL | Status: DC | PRN

## 2011-03-09 MED ORDER — FAMOTIDINE IN NACL 20-0.9 MG/50ML-% IV SOLN
20.0000 mg | Freq: Two times a day (BID) | INTRAVENOUS | Status: DC
  Administered 2011-03-09: 20 mg via INTRAVENOUS
  Filled 2011-03-09 (×2): qty 50

## 2011-03-09 MED ORDER — THIAMINE HCL 100 MG/ML IJ SOLN
100.0000 mg | Freq: Every day | INTRAMUSCULAR | Status: AC
  Administered 2011-03-09: 100 mg via INTRAVENOUS
  Filled 2011-03-09: qty 2

## 2011-03-09 MED ORDER — SODIUM CHLORIDE 0.9 % IV SOLN
999.0000 mL | Freq: Once | INTRAVENOUS | Status: AC
  Administered 2011-03-09: 999 mL via INTRAVENOUS

## 2011-03-09 MED ORDER — SODIUM CHLORIDE 0.9 % IV SOLN
1.0000 g | Freq: Once | INTRAVENOUS | Status: AC
  Administered 2011-03-09: 1 g via INTRAVENOUS
  Filled 2011-03-09: qty 10

## 2011-03-09 MED ORDER — MORPHINE SULFATE 2 MG/ML IJ SOLN
1.0000 mg | INTRAMUSCULAR | Status: DC | PRN
  Administered 2011-03-09: 1 mg via INTRAVENOUS

## 2011-03-09 MED ORDER — ONDANSETRON HCL 4 MG PO TABS: 4.0000 mg | ORAL_TABLET | Freq: Four times a day (QID) | ORAL | Status: DC | PRN

## 2011-03-09 MED ORDER — GLUCAGON HCL (RDNA) 1 MG IJ SOLR
1.0000 mg | Freq: Once | INTRAMUSCULAR | Status: AC
  Administered 2011-03-09: 1 mg via INTRAVENOUS
  Filled 2011-03-09: qty 1

## 2011-03-09 MED ORDER — ALUM & MAG HYDROXIDE-SIMETH 200-200-20 MG/5ML PO SUSP: 30.0000 mL | Freq: Four times a day (QID) | ORAL | Status: DC | PRN

## 2011-03-09 MED ORDER — ONDANSETRON HCL 4 MG/2ML IJ SOLN: 4.0000 mg | Freq: Three times a day (TID) | INTRAMUSCULAR | Status: DC | PRN

## 2011-03-09 MED ORDER — SODIUM CHLORIDE 0.9 % IJ SOLN
INTRAMUSCULAR | Status: AC
  Administered 2011-03-09: 10 mL
  Filled 2011-03-09: qty 3

## 2011-03-09 MED ORDER — ONDANSETRON HCL 4 MG/2ML IJ SOLN
INTRAMUSCULAR | Status: AC
  Administered 2011-03-09: 4 mg via INTRAVENOUS
  Filled 2011-03-09: qty 2

## 2011-03-09 MED ORDER — ONDANSETRON HCL 4 MG/2ML IJ SOLN: 4.0000 mg | Freq: Four times a day (QID) | INTRAMUSCULAR | Status: DC | PRN

## 2011-03-09 MED ORDER — DOCUSATE SODIUM 100 MG PO CAPS
100.0000 mg | ORAL_CAPSULE | Freq: Two times a day (BID) | ORAL | Status: DC
  Administered 2011-03-09 – 2011-03-12 (×6): 100 mg via ORAL
  Filled 2011-03-09 (×6): qty 1

## 2011-03-09 MED ORDER — ACETAMINOPHEN 650 MG RE SUPP: 650.0000 mg | Freq: Four times a day (QID) | RECTAL | Status: DC | PRN

## 2011-03-09 MED ORDER — SODIUM BICARBONATE 8.4 % IV SOLN
50.0000 meq | Freq: Once | INTRAVENOUS | Status: AC
  Administered 2011-03-09: 50 meq via INTRAVENOUS
  Filled 2011-03-09: qty 50

## 2011-03-09 MED ORDER — FUROSEMIDE 10 MG/ML IJ SOLN
10.0000 mg | Freq: Two times a day (BID) | INTRAMUSCULAR | Status: DC
  Administered 2011-03-09 – 2011-03-10 (×2): 10 mg via INTRAVENOUS
  Filled 2011-03-09 (×2): qty 2

## 2011-03-09 MED ORDER — INSULIN ASPART 100 UNIT/ML ~~LOC~~ SOLN: 0.0000 [IU] | Freq: Every day | SUBCUTANEOUS | Status: DC

## 2011-03-09 MED ORDER — ACETAMINOPHEN 325 MG PO TABS: 650.0000 mg | ORAL_TABLET | Freq: Four times a day (QID) | ORAL | Status: DC | PRN

## 2011-03-09 MED ORDER — DEXTROSE 5 % IV BOLUS
1000.0000 mL | Freq: Once | INTRAVENOUS | Status: AC
  Administered 2011-03-09: 1000 mL via INTRAVENOUS

## 2011-03-09 MED ORDER — SODIUM POLYSTYRENE SULFONATE 15 GM/60ML PO SUSP
15.0000 g | Freq: Once | ORAL | Status: AC
  Administered 2011-03-09: 15 g via ORAL
  Filled 2011-03-09: qty 60

## 2011-03-09 MED ORDER — INSULIN ASPART 100 UNIT/ML ~~LOC~~ SOLN
10.0000 [IU] | Freq: Once | SUBCUTANEOUS | Status: AC
  Administered 2011-03-09: 10 [IU] via INTRAVENOUS
  Filled 2011-03-09: qty 1

## 2011-03-09 MED ORDER — INSULIN ASPART 100 UNIT/ML ~~LOC~~ SOLN
0.0000 [IU] | Freq: Three times a day (TID) | SUBCUTANEOUS | Status: DC
  Administered 2011-03-10 (×2): 2 [IU] via SUBCUTANEOUS
  Administered 2011-03-11: 1 [IU] via SUBCUTANEOUS
  Administered 2011-03-11: 2 [IU] via SUBCUTANEOUS
  Administered 2011-03-11: 1 [IU] via SUBCUTANEOUS
  Administered 2011-03-12 (×2): 2 [IU] via SUBCUTANEOUS
  Filled 2011-03-09: qty 3

## 2011-03-09 MED ORDER — ONDANSETRON HCL 4 MG/2ML IJ SOLN
4.0000 mg | Freq: Once | INTRAMUSCULAR | Status: AC
  Administered 2011-03-09: 4 mg via INTRAVENOUS

## 2011-03-09 MED ORDER — SODIUM BICARBONATE 8.4 % IV SOLN
INTRAVENOUS | Status: DC
  Administered 2011-03-09 – 2011-03-10 (×3): via INTRAVENOUS
  Filled 2011-03-09 (×7): qty 1000

## 2011-03-09 MED ORDER — ALBUTEROL SULFATE (5 MG/ML) 0.5% IN NEBU: 2.5000 mg | INHALATION_SOLUTION | RESPIRATORY_TRACT | Status: DC | PRN

## 2011-03-09 NOTE — ED Notes (Signed)
C/o nausea; EDP notified and orders rec'd.

## 2011-03-09 NOTE — ED Notes (Signed)
Sent by family and home health aid d/t "not acting herself" per EMS.  Also, per EMS, pt vomited x 1 PTA. Denies nausea presently.

## 2011-03-09 NOTE — ED Notes (Signed)
Placed on cardiac monitor and EKG obtained. Placed on O2 2L/min per [place].

## 2011-03-09 NOTE — ED Provider Notes (Signed)
History     CSN: [id number]  Arrival date & time 03/09/11  1058   First MD Initiated Contact with Patient 03/09/11 1102      Chief Complaint  Patient presents with  . Altered Mental Status  . Emesis    (Consider location/radiation/quality/duration/timing/severity/associated sxs/prior treatment) HPI The patient presents following an episode of emesis at her nursing home.  She notes that since the presentation to this emergency department 2 weeks ago.  She has been in her usual state of health.  She denies any current nausea.  On arrival, notes that she threw up just prior to nursing home staff, calling for assistance.  She denies any new weakness, headache, diarrhea, fevers, chills, cough, chest pain, abdominal pain.  She denies any specific precipitants prior to the emesis and her symptoms resolved spontaneously Past Medical History  Diagnosis Date  . Hypertension   . Arthritis   . Gout spondylitis     Past Surgical History  Procedure Date  . Abdominal hysterectomy     No family history on file.  History  Substance Use Topics  . Smoking status: Never Smoker   . Smokeless tobacco: Never Used  . Alcohol Use: No    OB History    Grav Para Term Preterm Abortions TAB SAB Ect Mult Living                  Review of Systems  Constitutional: Negative for fever and chills.  HENT: Negative.   Eyes: Negative.   Respiratory: Negative.   Cardiovascular: Negative for chest pain.  Gastrointestinal: Positive for nausea and vomiting. Negative for diarrhea.  Genitourinary:       Dark urine  Musculoskeletal: Negative.   Skin: Negative for wound.  Neurological: Negative for dizziness, tremors, seizures, syncope, speech difficulty and weakness.  Hematological: Negative.     Allergies  Bee venom and Penicillins  Home Medications   Current Outpatient Rx  Name Route Sig Dispense Refill  . ALLOPURINOL 100 MG PO TABS Oral Take 300 mg by mouth at bedtime.      Marland Kitchen AMLODIPINE  BESY-BENAZEPRIL HCL 10-40 MG PO CAPS Oral Take 1 capsule by mouth daily.      . ASPIRIN 325 MG PO TABS Oral Take 1 tablet (325 mg total) by mouth daily.    Marland Kitchen CLOPIDOGREL BISULFATE 75 MG PO TABS Oral Take 1 tablet (75 mg total) by mouth daily with breakfast. 30 tablet 0  . COLCHICINE 0.6 MG PO TABS Oral Take 0.6 mg by mouth daily as needed. gout     . FAMOTIDINE 20 MG PO TABS Oral Take 1 tablet (20 mg total) by mouth daily.    . FUROSEMIDE 20 MG PO TABS Oral Take 20 mg by mouth daily.      Marland Kitchen METFORMIN HCL 500 MG PO TABS Oral Take 1 tablet (500 mg total) by mouth 2 (two) times daily with a meal. 60 tablet 0  . TOLTERODINE TARTRATE ER 2 MG PO CP24 Oral Take 2 mg by mouth daily.      . TRAMADOL HCL 50 MG PO TABS Oral Take 50 mg by mouth every 6 (six) hours as needed. pain       There were no vitals taken for this visit.  Physical Exam  Constitutional: She is oriented to person, place, and time. She appears well-developed and well-nourished. No distress.  HENT:  Head: Normocephalic and atraumatic.  Eyes: Conjunctivae and EOM are normal. Pupils are equal, round, and reactive to light.  Neck: Normal range of motion.  Cardiovascular: Regular rhythm and intact distal pulses.  Bradycardia present.   Pulmonary/Chest: Breath sounds normal. No stridor. No respiratory distress.  Musculoskeletal: She exhibits no edema and no tenderness.  Neurological: She is alert and oriented to person, place, and time. No cranial nerve deficit. She exhibits normal muscle tone. Coordination normal.  Skin: Skin is warm and dry.  Psychiatric: She has a normal mood and affect.    ED Course  Procedures (including critical care time)   Labs Reviewed  COMPREHENSIVE METABOLIC PANEL  LACTIC ACID, PLASMA  PROTIME-INR  URINALYSIS, ROUTINE W REFLEX MICROSCOPIC  CBC   No results found.   No diagnosis found.  Cardiac: 29 - ventricular rhythm -abnormal  Pulse ox 99% ra- normal   Date: 03/10/2011  Rate: 29   Rhythm: ventricular rhythm  QRS Axis: left  Intervals: QT prolonged  ST/T Wave abnormalities: nonspecific ST/T changes  Conduction Disutrbances:No atrial conduction  Narrative Interpretation:   Old EKG Reviewed: changes noted ABNORMAL ECG  CT/CXR reviewed by me   MDM  This 78 year old female presents from home following an episode of emesis.  Notably on presentation.  The patient is noted to a heart rate of 27.  This is new.  The patient notes that her nausea resolved entirely prior to arrival, and on initial exam has no complaints.  Given the patient's use of calcium channel blockers.  She initially received calcium and glucagon as well as IV fluids, a prompt discussion was conducted with the patient, her husband.  Torso, regarding goals of care and the patient deferred offers of resuscitation, and pacemaker placement.  The patient's labs are notable for new renal failure with a creatinine, increase of 700% and hyperkalemia.  Given these findings, the patient received medications, which did not change her heart rate, while she was in the emergency department numerous conversations were conducted with the patient and her family regarding goals of care and the patient was made DO NOT RESUSCITATE.  Following ED interventions.  She was admitted for further evaluation and management.  CRITICAL CARE Performed by: Gerhard Munch   Total critical care time: 45  Critical care time was exclusive of separately billable procedures and treating other patients.  Critical care was necessary to treat or prevent imminent or life-threatening deterioration.  Critical care was time spent personally by me on the following activities: development of treatment plan with patient and/or surrogate as well as nursing, discussions with consultants, evaluation of patient's response to treatment, examination of patient, obtaining history from patient or surrogate, ordering and performing treatments and interventions,  ordering and review of laboratory studies, ordering and review of radiographic studies, pulse oximetry and re-evaluation of patient's condition.         Gerhard Munch, MD 03/10/11 [phone]

## 2011-03-09 NOTE — ED Notes (Signed)
Foley catheter placed by nurse tech Wylene Simmer as ordered by Dr. Sherrie Mustache.  Sterile technique observed. Return of 30ml amber colored urine. Pt tolerated well.

## 2011-03-09 NOTE — H&P (Signed)
[name] [name] MRN: [id number] DOB/AGE: 25-Sep-1917 78 y.o. Primary Care Physician:COMSTOCK,LLOYD, MD, MD Admit date: 03/09/2011 Chief Complaint: Nausea and vomiting. HPI: The history is being provided by the patient, her biological son Galvin Proffer, and her stepson Jerolyn Shin Flenner. 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 She was discharged to [facility] skilled nursing facility from the previous hospitalization on 02/23/2011. She stayed at [facility] for approximately one week for rehabilitation. When she returned home, she had a poor appetite. She had not been drinking very much he had not been ambulating with a walker, but rather, mostly wheelchair-bound. When asked, the patient says that she has just been feeling bad. She has no complaints of abdominal pain, but she acknowledges being "sick to my stomach". She  had one episode of nausea and vomiting yesterday and 2-3 episodes of nausea and vomiting today. She has had no diarrhea, pain with urination, chest pain, shortness of breath, subjective fever chills, or swelling in her legs. She acknowledges that the last time she urinated was last night. She has not urinated at all today.  In the emergency department, she is noted to be bradycardic with a heart rate ranging from 28 beats per minute to 32 beats per minute. Her blood pressure was initially 87/52 but it is now 90/58. Her EKG reveals an idioventricular rhythm with a heart rate of 29 beats per minute. Her chest x-ray reveals cardiomegaly with no active disease. The CT scan of her head reveals right maxillary sinus thickening, evolution of the right cerebellar infarct with slight decrease in mass effect on the right fourth ventricle, and no new infarction noted. Her lab data are significant for a serum sodium of 131, potassium of 6.0, BUN of 75, and creatinine of 7.27. Her  renal function and serum potassium were within normal limits during the previous hospitalization. She is being admitted for further evaluation and management.    Past Medical History  Diagnosis Date  . Hypertension   . Arthritis   . Gout spondylitis   . H/O: CVA (cardiovascular accident) 02/19/2011    Cerebellar distribution  . Dizziness   . Diabetes mellitus type II 02/19/2011  . Sciatica     Past Surgical History  Procedure Date  . Abdominal hysterectomy     Prior to Admission medications   Medication Sig Start Date End Date Taking? Authorizing Provider  allopurinol (ZYLOPRIM) 100 MG tablet Take 300 mg by mouth at bedtime.     Yes Historical Provider, MD  amLODipine-benazepril (LOTREL) 10-40 MG per capsule Take 1 capsule by mouth daily.     Yes Historical Provider, MD  aspirin 325 MG tablet Take 1 tablet (325 mg total) by mouth daily. 02/22/11 02/22/12 Yes Hollice Espy, MD  atenolol (TENORMIN) 100 MG tablet Take 100 mg by mouth daily.   Yes Historical Provider, MD  clopidogrel (PLAVIX) 75 MG tablet Take 1 tablet (75 mg total) by mouth daily with breakfast. 02/22/11 02/22/12 Yes Hollice Espy, MD  famotidine (PEPCID) 20 MG tablet Take 1 tablet (20 mg total) by mouth daily. 02/23/11 02/23/12 Yes Elliot Cousin, MD  furosemide (LASIX) 20 MG tablet Take 20 mg by mouth daily.     Yes Historical Provider, MD  meclizine (ANTIVERT) 25 MG tablet Take 25 mg by mouth 3 (three) times daily as needed. For nausea   Yes Historical Provider, MD  metFORMIN (GLUCOPHAGE) 500  MG tablet Take 1 tablet (500 mg total) by mouth 2 (two) times daily with a meal. 02/22/11 02/22/12 Yes Hollice Espy, MD  tolterodine (DETROL LA) 2 MG 24 hr capsule Take 2 mg by mouth daily.     Yes Historical Provider, MD  colchicine 0.6 MG tablet Take 0.6 mg by mouth daily as needed. gout     Historical Provider, MD  traMADol (ULTRAM) 50 MG tablet Take 50 mg by mouth every 6 (six) hours as needed. pain     Historical Provider, MD      Allergies:  Allergies  Allergen Reactions  . Bee Venom Swelling  . Penicillins Other (See Comments)    unknown    No family history on file.  Social History: She is widowed. She has 2 biological children and 4 stepchildren.  Her stepdaughter and one of her stepsons live with her and helps to take care of her.she has a CNA who comes in a couple of hours daily. She has a walker but, she has been too weak to use it. She denies tobacco, alcohol, and illicit drug use.     ROS: As above in the history of present illness.  PHYSICAL EXAM: Blood pressure 90/58, pulse 32, temperature 97 F (36.1 C), resp. rate 18, height 5\' 1"  (1.549 m), weight 67.586 kg (149 lb), SpO2 96.00%.  General: Elderly, surprisingly alert, 78 year old after American woman who is lying in bed, in no acute distress but she does appear ill. HEENT: Head is no cephalic, nontraumatic. Pupils are equal, round, and reactive to light. Arcus senilis is noted. Extraocular movement are intact. Oropharynx reveals a full set of dentures. Mucous membranes are very dry. No posterior exudates or erythema. Neck: Supple, no thyromegaly, no adenopathy. Lungs: Clear to auscultation with decreased breath sounds in the bases. Heart: Distant S1, S2, with bradycardia. Abdomen: hypoactive bowel sounds, soft, nontender, nondistended. Extremities: Trace pedal edema. Neurologic: She is alert and oriented to herself, her sons, and place. Cranial nerves are grossly intact. She follows small commands. She has an intention tremor worse on the right. Her handgrip bilaterally is weak. She is able to raise each leg against gravity approximately 20.   Basic Metabolic Panel:  Basename 03/09/11 1109  NA 131*  K 6.0*  CL 95*  CO2 19  GLUCOSE 100*  BUN 75*  CREATININE 7.27*  CALCIUM 9.9  MG --  PHOS --   Liver Function Tests:  Basename 03/09/11 1109  AST 21  ALT 15  ALKPHOS 75  BILITOT 0.5  PROT 6.9  ALBUMIN 3.5   No results found  for this basename: LIPASE:2,AMYLASE:2 in the last 72 hours No results found for this basename: AMMONIA:2 in the last 72 hours CBC:  Basename 03/09/11 1109  WBC 6.4  NEUTROABS --  HGB 11.0*  HCT 32.2*  MCV 83.2  PLT 215   Cardiac Enzymes:  Basename 03/09/11 1135  CKTOTAL --  CKMB --  CKMBINDEX --  TROPONINI <0.30   BNP: No results found for this basename: PROBNP:3 in the last 72 hours D-Dimer: No results found for this basename: DDIMER:2 in the last 72 hours CBG: No results found for this basename: GLUCAP:6 in the last 72 hours Hemoglobin A1C: No results found for this basename: HGBA1C in the last 72 hours Fasting Lipid Panel: No results found for this basename: CHOL,HDL,LDLCALC,TRIG,CHOLHDL,LDLDIRECT in the last 72 hours Thyroid Function Tests: No results found for this basename: TSH,T4TOTAL,FREET4,T3FREE,THYROIDAB in the last 72 hours Anemia Panel: No results found  for this basename: VITAMINB12,FOLATE,FERRITIN,TIBC,IRON,RETICCTPCT in the last 72 hours Coagulation:  Basename 03/09/11 1109  LABPROT 17.0*  INR 1.36   Urine Drug Screen: Drugs of Abuse     Component Value Date/Time   LABOPIA NONE DETECTED 02/20/2011 0549   COCAINSCRNUR NONE DETECTED 02/20/2011 0549   LABBENZ NONE DETECTED 02/20/2011 0549   AMPHETMU NONE DETECTED 02/20/2011 0549   THCU NONE DETECTED 02/20/2011 0549   LABBARB NONE DETECTED 02/20/2011 0549    Alcohol Level: No results found for this basename: ETH:2 in the last 72 hours Urinalysis:  Misc. Labs:   EKG: As above in the history of present illness.   No results found for this or any previous visit (from the past 240 hour(s)).   Results for orders placed during the hospital encounter of 03/09/11 (from the past 48 hour(s))  COMPREHENSIVE METABOLIC PANEL     Status: Abnormal   Collection Time   03/09/11 11:09 AM      Component Value Range Comment   Sodium 131 (*) 135 - 145 (mEq/L)    Potassium 6.0 (*) 3.5 - 5.1 (mEq/L)    Chloride 95 (*) 96 -  112 (mEq/L)    CO2 19  19 - 32 (mEq/L)    Glucose, Bld 100 (*) 70 - 99 (mg/dL)    BUN 75 (*) 6 - 23 (mg/dL)    Creatinine, Ser 1.61 (*) 0.50 - 1.10 (mg/dL)    Calcium 9.9  8.4 - 10.5 (mg/dL)    Total Protein 6.9  6.0 - 8.3 (g/dL)    Albumin 3.5  3.5 - 5.2 (g/dL)    AST 21  0 - 37 (U/L)    ALT 15  0 - 35 (U/L)    Alkaline Phosphatase 75  39 - 117 (U/L)    Total Bilirubin 0.5  0.3 - 1.2 (mg/dL)    GFR calc non Af Amer 4 (*) >90 (mL/min)    GFR calc Af Amer 5 (*) >90 (mL/min)   LACTIC ACID, PLASMA     Status: Normal   Collection Time   03/09/11 11:09 AM      Component Value Range Comment   Lactic Acid, Venous 2.2  0.5 - 2.2 (mmol/L)   PROTIME-INR     Status: Abnormal   Collection Time   03/09/11 11:09 AM      Component Value Range Comment   Prothrombin Time 17.0 (*) 11.6 - 15.2 (seconds)    INR 1.36  0.00 - 1.49    CBC     Status: Abnormal   Collection Time   03/09/11 11:09 AM      Component Value Range Comment   WBC 6.4  4.0 - 10.5 (K/uL)    RBC 3.87  3.87 - 5.11 (MIL/uL)    Hemoglobin 11.0 (*) 12.0 - 15.0 (g/dL)    HCT 09.6 (*) 04.5 - 46.0 (%)    MCV 83.2  78.0 - 100.0 (fL)    MCH 28.4  26.0 - 34.0 (pg)    MCHC 34.2  30.0 - 36.0 (g/dL)    RDW 40.9 (*) 81.1 - 15.5 (%)    Platelets 215  150 - 400 (K/uL)   TROPONIN I     Status: Normal   Collection Time   03/09/11 11:35 AM      Component Value Range Comment   Troponin I <0.30  <0.30 (ng/mL)     Dg Chest 1 View  03/09/2011  *RADIOLOGY REPORT*  Clinical Data: Vomiting, altered L O C  CHEST -  1 VIEW  Comparison: 02/19/2011  Findings: Cardiomegaly again noted.  No acute infiltrate or pleural effusion.  No pulmonary edema.  Atherosclerotic calcifications of the thoracic aorta.  Bony thorax is stable.  IMPRESSION: Cardiomegaly again noted.  No active disease.  Original Report Authenticated By: Natasha Mead, M.D.   Dg Chest 1 View  02/19/2011  *RADIOLOGY REPORT*  Clinical Data: Dizziness  CHEST - 1 VIEW  Comparison: None.  Findings:  Borderline cardiomegaly noted.  No acute infiltrate or pleural effusion.  No pulmonary edema.  Atherosclerotic calcifications of thoracic aorta. Mild elevation of the right hemidiaphragm.  IMPRESSION: Borderline cardiomegaly.  No active disease.  Original Report Authenticated By: Natasha Mead, M.D.   Ct Head Wo Contrast  03/09/2011  *RADIOLOGY REPORT*  Clinical Data: Altered L O C, prior CVA  CT HEAD WITHOUT CONTRAST  Technique:  Contiguous axial images were obtained from the base of the skull through the vertex without contrast.  Comparison: 02/20/2011  Findings: No skull fracture is noted.  There is mild mucosal thickening in the right maxillary sinus.  The mastoid air cells are unremarkable.  Again noted expected evolution of the right cerebellar infarct with slight decrease in mass effect on the right fourth ventricle.  No definite new infarct is noted.  Stable cerebral atrophy and chronic white matter disease.  Bilateral basal ganglia punctate calcifications are again noted.  IMPRESSION: Mild mucosal thickening right maxillary sinus.Again noted expected evolution of the right cerebellar infarct with slight decrease in mass effect on the right fourth ventricle.  No definite new infarct is noted.  Stable cerebral atrophy and chronic white matter disease.  Original Report Authenticated By: Natasha Mead, M.D.   Ct Head Wo Contrast  02/20/2011  *RADIOLOGY REPORT*  Clinical Data: Followup infarct.  Dizziness.  CT HEAD WITHOUT CONTRAST  Technique:  Contiguous axial images were obtained from the base of the skull through the vertex without contrast.  Comparison: 02/18/2010 CT and MRI.  Findings: Expected evolution of right cerebellar infarct with mild increased associated mass effect upon the surrounding structures including the fourth ventricle.  No evidence of hydrocephalus.  No intracranial hemorrhage.  Global atrophy.  Small vessel disease type changes.  No intracranial mass lesion detected on this unenhanced exam.   IMPRESSION: Expected evolution of right cerebellar infarct with mild increased associated mass effect upon the surrounding structures including the fourth ventricle.  No evidence of hydrocephalus or intracranial hemorrhage.  Original Report Authenticated By: Fuller Canada, M.D.   Ct Head Wo Contrast  02/19/2011  *RADIOLOGY REPORT*  Clinical Data: Nausea vomiting with dizziness since last night. High blood pressure.  CT HEAD WITHOUT CONTRAST  Technique:  Contiguous axial images were obtained from the base of the skull through the vertex without contrast.  Comparison: None.  Findings: Moderate sized acute/subacute non hemorrhagic right cerebellar infarct involving the mid to inferior aspect of the right cerebellum.  Presently no associated hemorrhage or significant mass effect however as this progresses the patient is at risk for these complications.  Prominent small vessel disease type changes.  No intracranial mass lesion detected on this unenhanced exam.  Global atrophy without hydrocephalus.  Moderate mucosal thickening right maxillary sinus with wall thickening suggesting chronic sinusitis.  Vascular calcifications.  IMPRESSION: Moderate sized acute/subacute non hemorrhagic right cerebellar infarct as noted above.  Critical Value/emergent results were called by telephone at the time of interpretation on 02/19/2011 at  at 12:34 p.m.  to  Dr. Estell Harpin., who verbally acknowledged these results.  Original  Report Authenticated By: Fuller Canada, M.D.   Mr Angiogram Head Wo Contrast  02/19/2011  *RADIOLOGY REPORT*  Clinical Data:  Dizziness with nausea and vomiting.  MRI HEAD WITH AND WITHOUT CONTRAST MRA HEAD WITHOUT CONTRAST  Technique: Multiplanar, multiecho pulse sequences of the brain and surrounding structures were obtained according to standard protocol with and without intravenous contrast.  Angiographic images of the Circle of Willis were obtained using MRA technique without intravenous contrast.   Contrast: 14 ml MultiHance.  Comparison01/04/2011 CT.  No comparison MR.  MRI HEAD  Findings: Moderate size acute/subacute non hemorrhagic right mid to inferior cerebellar infarct (PICA distribution).  Local mass effect with mild narrowing of the fourth ventricle.  No hydrocephalus.  Tiny acute non hemorrhagic infarct posterior right opercular region.  This raises possibility of embolic disease.  Mild hemosiderin staining left parietal lobe may be related to the prior infarct or trauma.  Scattered tiny areas of blood breakdown products may be related to episode hemorrhagic ischemia without large blood collection.  Mild to slightly moderate small vessel disease type changes.  Global atrophy.  No intracranial mass lesions or abnormal enhancement.  Transverse ligament hypertrophy.  Mild spinal stenosis C4-5 and C5- 6.  Right maxillary sinus moderate mucosal thickening.  IMPRESSION: Moderate size acute/subacute non hemorrhagic right mid to inferior cerebellar infarct (PICA distribution).  Local mass effect with mild narrowing of the fourth ventricle.  No hydrocephalus.  Tiny acute non hemorrhagic infarct posterior right opercular region.  This raises possibility of embolic disease.  Mild hemosiderin staining left parietal lobe may be related to the prior infarct or trauma.  Mild to slightly moderate small vessel disease type changes.  Global atrophy.  Right maxillary sinus moderate mucosal thickening.  MRA HEAD  Findings: Motion degraded exam.  Moderate irregularity with areas of narrowing and dilation involving portions of the pre cavernous, cavernous and supraclinoid aspect of the internal carotid artery bilaterally.  High-grade focal stenosis distal A1 segment of the left anterior cerebral artery.  Middle cerebral artery moderate branch vessel irregularity.  Ectatic vertebral arteries and basilar artery.  Artifact versus narrowing of the distal vertebral arteries bilaterally.  Nonvisualization right PICA consistent  with the patient's acute infarct.  Moderate to marked tandem stenoses left PICA.  Nonvisualization AICAs.  Moderate narrowing irregularity of the superior cerebellar artery bilaterally.  Moderate to marked narrowing proximal posterior cerebral artery. Moderate narrowing and  irregularity distal aspect of the posterior cerebral arteries bilaterally  No saccular aneurysm.  IMPRESSION: Prominent intracranial atherosclerotic type changes as detailed above including nonvisualization of the right PICA corresponding to the patient's above described acute/subacute right cerebellar infarct.  Original Report Authenticated By: Fuller Canada, M.D.   Mr Laqueta Jean Wo Contrast  02/19/2011  *RADIOLOGY REPORT*  Clinical Data:  Dizziness with nausea and vomiting.  MRI HEAD WITH AND WITHOUT CONTRAST MRA HEAD WITHOUT CONTRAST  Technique: Multiplanar, multiecho pulse sequences of the brain and surrounding structures were obtained according to standard protocol with and without intravenous contrast.  Angiographic images of the Circle of Willis were obtained using MRA technique without intravenous contrast.  Contrast: 14 ml MultiHance.  Comparison01/04/2011 CT.  No comparison MR.  MRI HEAD  Findings: Moderate size acute/subacute non hemorrhagic right mid to inferior cerebellar infarct (PICA distribution).  Local mass effect with mild narrowing of the fourth ventricle.  No hydrocephalus.  Tiny acute non hemorrhagic infarct posterior right opercular region.  This raises possibility of embolic disease.  Mild hemosiderin staining left parietal lobe  may be related to the prior infarct or trauma.  Scattered tiny areas of blood breakdown products may be related to episode hemorrhagic ischemia without large blood collection.  Mild to slightly moderate small vessel disease type changes.  Global atrophy.  No intracranial mass lesions or abnormal enhancement.  Transverse ligament hypertrophy.  Mild spinal stenosis C4-5 and C5- 6.  Right maxillary  sinus moderate mucosal thickening.  IMPRESSION: Moderate size acute/subacute non hemorrhagic right mid to inferior cerebellar infarct (PICA distribution).  Local mass effect with mild narrowing of the fourth ventricle.  No hydrocephalus.  Tiny acute non hemorrhagic infarct posterior right opercular region.  This raises possibility of embolic disease.  Mild hemosiderin staining left parietal lobe may be related to the prior infarct or trauma.  Mild to slightly moderate small vessel disease type changes.  Global atrophy.  Right maxillary sinus moderate mucosal thickening.  MRA HEAD  Findings: Motion degraded exam.  Moderate irregularity with areas of narrowing and dilation involving portions of the pre cavernous, cavernous and supraclinoid aspect of the internal carotid artery bilaterally.  High-grade focal stenosis distal A1 segment of the left anterior cerebral artery.  Middle cerebral artery moderate branch vessel irregularity.  Ectatic vertebral arteries and basilar artery.  Artifact versus narrowing of the distal vertebral arteries bilaterally.  Nonvisualization right PICA consistent with the patient's acute infarct.  Moderate to marked tandem stenoses left PICA.  Nonvisualization AICAs.  Moderate narrowing irregularity of the superior cerebellar artery bilaterally.  Moderate to marked narrowing proximal posterior cerebral artery. Moderate narrowing and  irregularity distal aspect of the posterior cerebral arteries bilaterally  No saccular aneurysm.  IMPRESSION: Prominent intracranial atherosclerotic type changes as detailed above including nonvisualization of the right PICA corresponding to the patient's above described acute/subacute right cerebellar infarct.  Original Report Authenticated By: Fuller Canada, M.D.   US Carotid Duplex Bilateral  02/21/2011  *RADIOLOGY REPORT*  Clinical Data: Acute right cerebellar infarct.  BILATERAL CAROTID DUPLEX ULTRASOUND  Technique: Wallace Cullens scale imaging, color Doppler and  duplex ultrasound was performed of bilateral carotid and vertebral arteries in the neck.  Comparison:  02/20/2011  Criteria:  Quantification of carotid stenosis is based on velocity parameters that correlate the residual internal carotid diameter with NASCET-based stenosis levels, using the diameter of the distal internal carotid lumen as the denominator for stenosis measurement.  The following velocity measurements were obtained:                   PEAK SYSTOLIC/END DIASTOLIC RIGHT ICA:                        94/12cm/sec CCA:                        90/14cm/sec SYSTOLIC ICA/CCA RATIO:     0.93 DIASTOLIC ICA/CCA RATIO:    1.07 ECA:                        106cm/sec  LEFT ICA:                        88/14cm/sec CCA:                        74/12cm/sec SYSTOLIC ICA/CCA RATIO:     1.18 DIASTOLIC ICA/CCA RATIO:    1.17 ECA:  84cm/sec  Findings:  RIGHT CAROTID ARTERY: Very minor intimal thickening / early atherosclerosis.  No hemodynamically significant right ICA stenosis, velocity elevation, or turbulent flow.  RIGHT VERTEBRAL ARTERY:  Antegrade  LEFT CAROTID ARTERY: Very minor carotid atherosclerosis.  No hemodynamically significant left ICA stenosis, velocity elevation, or turbulent flow.  LEFT VERTEBRAL ARTERY:  Antegrade  IMPRESSION: Minor carotid atherosclerosis.  No hemodynamically significant ICA stenosis on either side.  Original Report Authenticated By: Judie Petit. Ruel Favors, M.D.    Impression:  Active Problems:  Bradycardia  Nausea and vomiting  Diabetes mellitus, type II  Hyperkalemia  Acute renal failure  Hypotension  Anemia  Coagulopathy   1. Acute renal failure. Her creatinine was 0.92  2-1/2 weeks ago. It is now 7.27. The etiology of her renal failure is unclear. It appears to be prerenal azotemia as her appetite has been poor and her fluid intake has been even worse according to the family.  Will need to rule out any type of structural abnormality and/or urinary tract  infection.  2. Hyperkalemia. Her serum potassium was within normal limits a few weeks ago. The hyperkalemia is likely secondary to the acute renal failure.  3. Severe bradycardia. During the previous hospitalization, atenolol was discontinued because of her bradycardia. However, according to her medication list, she was apparently restarted on atenolol at 100 mg daily. It is likely, that the atenolol is contributing to her severe bradycardia. She may have underlying sick sinus syndrome. The emergency department physician was anticipating transferring her to [facility] for a potential pacemaker, however, the patient refused. I confirmed this with her and she does not want a pacemaker. Her family agrees in that they are in agreement with her wishes.  4. Nausea and vomiting.  It is unclear which came first the acute renal failure or the nausea and vomiting. She may have underlying uremia.  5. Recent right cerebellar stroke.  She was started on Plavix during the previous hospitalization.  6. Type 2 diabetes mellitus. She was started on metformin during the previous hospitalization. Her hemoglobin A1c was 6.6.  7. Mild coagulopathy.  7. Mild anemia.   Plan:  1. The patient was given an amp of bicarbonate, calcium gluconate, dextrose,  and insulin for treatment of her hyperkalemia. 2. Will give her a dose of Kayexalate. We'll start aggressive IV fluid hydration with D5 half-normal saline with bicarbonate added. We'll give her Lasix at 10 mg IV every 12 hours. 3. We'll treat her nausea and vomiting with as needed Zofran. We'll add IV Pepcid. 4. We'll order a urinalysis and urine culture. We'll also order a renal ultrasound to rule out any structural abnormalities. The nurse has been asked to insert a Foley catheter for strict I.'s and O.'s. 5. We'll hold the Plavix in the setting of worsening renal failure. 6. Will limit her by mouth medications and diet while she symptomatic. Will  obviously discontinue atenolol. 7. DO NOT RESUSCITATE was confirmed with the patient and her son Galvin Proffer. If the patient's heart rate does not improve and/or if her renal function does not improve, she could be a candidate for hospice.      Damonta Cossey 03/09/2011, 4:45 PM

## 2011-03-10 ENCOUNTER — Other Ambulatory Visit: Payer: Self-pay

## 2011-03-10 LAB — COMPREHENSIVE METABOLIC PANEL
BUN: 73 mg/dL — ABNORMAL HIGH (ref 6–23)
CO2: 20 mEq/L (ref 19–32)
Calcium: 9.1 mg/dL (ref 8.4–10.5)
Chloride: 98 mEq/L (ref 96–112)
Creatinine, Ser: 6.37 mg/dL — ABNORMAL HIGH (ref 0.50–1.10)
GFR calc Af Amer: 6 mL/min — ABNORMAL LOW (ref >90)
GFR calc non Af Amer: 5 mL/min — ABNORMAL LOW (ref >90)
Glucose, Bld: 180 mg/dL — ABNORMAL HIGH (ref 70–99)
Total Bilirubin: 0.5 mg/dL (ref 0.3–1.2)

## 2011-03-10 LAB — GLUCOSE, CAPILLARY
Glucose-Capillary: 183 mg/dL — ABNORMAL HIGH (ref 70–99)
Glucose-Capillary: 120 mg/dL — ABNORMAL HIGH (ref 70–99)
Glucose-Capillary: 130 mg/dL — ABNORMAL HIGH (ref 70–99)

## 2011-03-10 LAB — CBC
MCH: 28.1 pg (ref 26.0–34.0)
Platelets: 191 10*3/uL (ref 150–400)
RBC: 3.45 MIL/uL — ABNORMAL LOW (ref 3.87–5.11)
WBC: 6.9 10*3/uL (ref 4.0–10.5)

## 2011-03-10 LAB — URINE CULTURE
Colony Count: NO GROWTH
Culture  Setup Time: 201301221608

## 2011-03-10 MED ORDER — SODIUM CHLORIDE 0.9 % IV SOLN
INTRAVENOUS | Status: DC
  Administered 2011-03-10 – 2011-03-12 (×5): via INTRAVENOUS

## 2011-03-10 MED ORDER — FAMOTIDINE IN NACL 20-0.9 MG/50ML-% IV SOLN
20.0000 mg | INTRAVENOUS | Status: DC
  Administered 2011-03-10: 20 mg via INTRAVENOUS
  Filled 2011-03-10 (×2): qty 50

## 2011-03-10 NOTE — Progress Notes (Signed)
Chart reviewed.  Subjective: Nausea improved, but has not eaten much today. No dizziness currently. Very weak. No other complaints. No chest pain.  Objective: Vital signs in last 24 hours: Filed Vitals:   03/09/11 1719 03/09/11 2019 03/09/11 2201 03/10/11 0500  BP:   95/50   Pulse:   34   Temp:   97.4 F (36.3 C)   TempSrc:   Axillary   Resp:   18   Height:      Weight:    65 kg (143 lb 4.8 oz)  SpO2: 92% 93% 93%    Weight change:  No intake or output data in the 24 hours ending 03/10/11 1349 Telemetry shows bradycardia without any visible P waves currently  Physical Exam: General: Slow to respond but appropriate for the most part Lungs clear to auscultation bilaterally without wheeze rhonchi or rales Cardiovascular slow regular Abdomen soft nontender nondistended Extremities no clubbing cyanosis or edema  Lab Results: Basic Metabolic Panel:  Lab 03/10/11 7846 03/09/11 1109  NA 132* 131*  K 4.4 6.0*  CL 98 95*  CO2 20 19  GLUCOSE 180* 100*  BUN 73* 75*  CREATININE 6.37* 7.27*  CALCIUM 9.1 9.9  MG -- --  PHOS -- --   Liver Function Tests:  Lab 03/10/11 0538 03/09/11 1109  AST 54* 21  ALT 45* 15  ALKPHOS 67 75  BILITOT 0.5 0.5  PROT 6.1 6.9  ALBUMIN 3.0* 3.5   No results found for this basename: LIPASE:2,AMYLASE:2 in the last 168 hours No results found for this basename: AMMONIA:2 in the last 168 hours CBC:  Lab 03/10/11 0538 03/09/11 1109  WBC 6.9 6.4  NEUTROABS -- --  HGB 9.7* 11.0*  HCT 28.2* 32.2*  MCV 81.7 83.2  PLT 191 215   Cardiac Enzymes:  Lab 03/09/11 1702 03/09/11 1135  CKTOTAL 85 --  CKMB 4.0 --  CKMBINDEX -- --  TROPONINI <0.30 <0.30   BNP: No results found for this basename: PROBNP:3 in the last 168 hours D-Dimer: No results found for this basename: DDIMER:2 in the last 168 hours CBG:  Lab 03/10/11 1131 03/10/11 0753 03/09/11 2055  GLUCAP 183* 162* 137*   Hemoglobin A1C: No results found for this basename: HGBA1C in the  last 168 hours Fasting Lipid Panel: No results found for this basename: CHOL,HDL,LDLCALC,TRIG,CHOLHDL,LDLDIRECT in the last 962 hours Thyroid Function Tests: No results found for this basename: TSH,T4TOTAL,FREET4,T3FREE,THYROIDAB in the last 168 hours Coagulation:  Lab 03/09/11 1109  LABPROT 17.0*  INR 1.36   Anemia Panel: No results found for this basename: VITAMINB12,FOLATE,FERRITIN,TIBC,IRON,RETICCTPCT in the last 168 hours Urine Drug Screen: Drugs of Abuse     Component Value Date/Time   LABOPIA NONE DETECTED 02/20/2011 0549   COCAINSCRNUR NONE DETECTED 02/20/2011 0549   LABBENZ NONE DETECTED 02/20/2011 0549   AMPHETMU NONE DETECTED 02/20/2011 0549   THCU NONE DETECTED 02/20/2011 0549   LABBARB NONE DETECTED 02/20/2011 0549      Micro Results: No results found for this or any previous visit (from the past 240 hour(s)). Studies/Results: Dg Chest 1 View  03/09/2011  *RADIOLOGY REPORT*  Clinical Data: Vomiting, altered L O C  CHEST - 1 VIEW  Comparison: 02/19/2011  Findings: Cardiomegaly again noted.  No acute infiltrate or pleural effusion.  No pulmonary edema.  Atherosclerotic calcifications of the thoracic aorta.  Bony thorax is stable.  IMPRESSION: Cardiomegaly again noted.  No active disease.  Original Report Authenticated By: Natasha Mead, M.D.   Ct Head Wo Contrast  03/09/2011  *RADIOLOGY REPORT*  Clinical Data: Altered L O C, prior CVA  CT HEAD WITHOUT CONTRAST  Technique:  Contiguous axial images were obtained from the base of the skull through the vertex without contrast.  Comparison: 02/20/2011  Findings: No skull fracture is noted.  There is mild mucosal thickening in the right maxillary sinus.  The mastoid air cells are unremarkable.  Again noted expected evolution of the right cerebellar infarct with slight decrease in mass effect on the right fourth ventricle.  No definite new infarct is noted.  Stable cerebral atrophy and chronic white matter disease.  Bilateral basal ganglia  punctate calcifications are again noted.  IMPRESSION: Mild mucosal thickening right maxillary sinus.Again noted expected evolution of the right cerebellar infarct with slight decrease in mass effect on the right fourth ventricle.  No definite new infarct is noted.  Stable cerebral atrophy and chronic white matter disease.  Original Report Authenticated By: Natasha Mead, M.D.   US Renal  03/09/2011  *RADIOLOGY REPORT*  Clinical Data: Acute renal failure  RENAL/URINARY TRACT ULTRASOUND COMPLETE  Comparison:  None.  Findings:  Right Kidney:  9 cm in length. No hydronephrosis.  Well-preserved cortex.  Normal size and parenchymal echotexture without focal abnormalities.  Left Kidney:  8.5 cm length.  22 x 23 x 27 mm simple appearing cyst in the upper pole.  No hydronephrosis or solid renal lesion.  Bladder:  Decompressed by Foley catheter  IMPRESSION:  1.  Negative for hydronephrosis. 2.  2.7 cm left renal cyst.  Original Report Authenticated By: Thora Lance III, M.D.   Scheduled Meds:   . sodium chloride  999 mL Intravenous Once  . calcium gluconate  1 g Intravenous Once  . dextrose  1,000 mL Intravenous Once  . docusate sodium  100 mg Oral BID  . famotidine (PEPCID) IV  20 mg Intravenous Q24H  . insulin aspart  0-5 Units Subcutaneous QHS  . insulin aspart  0-9 Units Subcutaneous TID WC  . insulin aspart  10 Units Intravenous Once  . sodium bicarbonate  50 mEq Intravenous Once  . sodium chloride      . sodium chloride      . sodium polystyrene  15 g Oral Once  . thiamine  100 mg Intravenous Daily  . DISCONTD: famotidine (PEPCID) IV  20 mg Intravenous Q12H  . DISCONTD: furosemide  10 mg Intravenous Q12H   Continuous Infusions:   . sodium chloride    . DISCONTD: dextrose 5 % and 0.45% NaCl 1,000 mL with sodium bicarbonate 50 mEq infusion 150 mL/hr at 03/10/11 1025   PRN Meds:.acetaminophen, acetaminophen, albuterol, meclizine, morphine, ondansetron (ZOFRAN) IV, DISCONTD: alum & mag  hydroxide-simeth, DISCONTD: ondansetron (ZOFRAN) IV, DISCONTD: ondansetron Assessment/Plan: Principal Problem:  *Bradycardia, refuses pacemaker, probably medication related in this setting of acute renal failure or and resolving hyperkalemia. I will check a no other 12-lead EKG today. She had a slightly low TSH during the last hospitalization, and normal free T4 and free T3. Active Problems:  Acute renal failure: No signs of obstruction. Most likely prerenal azotemia in the setting of ACE inhibitor and loop diuretic  HTN (hypertension): Medications held  Cerebellar infarct: Nausea improved today. If still problematic, can try a scopolamine patch  Diabetes mellitus, type II: Metformin held due to renal failure. Continue sliding scale for now. Change IV fluids to saline.  Anemia: Worse than previous. Her indices are borderline microcytic. I will check an iron TIBC and ferritin  Nausea and vomiting improved.  Advance diet  Hyperkalemia resolved  Hypotension resolved  Coagulopathy DO NOT RESUSCITATE.  Discussed with family.   LOS: 1 day   [name] [name] L 03/10/2011, 1:49 PM

## 2011-03-10 NOTE — Progress Notes (Signed)
INITIAL ADULT NUTRITION ASSESSMENT Date: 03/10/2011   Time: 3:11 PM  Reason for Assessment: Poor po intake  ASSESSMENT: Female 78 y.o.  Dx: Bradycardia   Past Medical History  Diagnosis Date  . Hypertension   . Arthritis   . Gout spondylitis   . H/O: CVA (cardiovascular accident) 02/19/2011    Cerebellar distribution  . Dizziness   . Diabetes mellitus type II 02/19/2011  . Sciatica    Scheduled Meds:   . docusate sodium  100 mg Oral BID  . famotidine (PEPCID) IV  20 mg Intravenous Q24H  . insulin aspart  0-5 Units Subcutaneous QHS  . insulin aspart  0-9 Units Subcutaneous TID WC  . sodium chloride      . sodium chloride      . sodium polystyrene  15 g Oral Once  . thiamine  100 mg Intravenous Daily  . DISCONTD: famotidine (PEPCID) IV  20 mg Intravenous Q12H  . DISCONTD: furosemide  10 mg Intravenous Q12H   Continuous Infusions:   . sodium chloride 125 mL/hr at 03/10/11 1446  . DISCONTD: dextrose 5 % and 0.45% NaCl 1,000 mL with sodium bicarbonate 50 mEq infusion 150 mL/hr at 03/10/11 1025   PRN Meds:.acetaminophen, acetaminophen, albuterol, meclizine, morphine, ondansetron (ZOFRAN) IV, DISCONTD: alum & mag hydroxide-simeth, DISCONTD: ondansetron (ZOFRAN) IV, DISCONTD: ondansetron  Ht: 5\' 3"  (160 cm)  Wt: 143 lb 4.8 oz (65 kg)  Ideal Wt: 52.4 kg % Ideal Wt: 124%  Usual Wt: 140-145# % Usual Wt: 100%  Body mass index is 25.38 kg/(m^2).  Food/Nutrition Related Hx: Several family members present. Son reports poor appetite and oral intake for past week. Diet advanced to Regular. Nutritional intake inadequate to meet est needs currently. Metabolic derangement noted.   CMP     Component Value Date/Time   NA 132* 03/10/2011 0538   K 4.4 03/10/2011 0538   CL 98 03/10/2011 0538   CO2 20 03/10/2011 0538   GLUCOSE 180* 03/10/2011 0538   BUN 73* 03/10/2011 0538   CREATININE 6.37* 03/10/2011 0538   CALCIUM 9.1 03/10/2011 0538   PROT 6.1 03/10/2011 0538   ALBUMIN 3.0*  03/10/2011 0538   AST 54* 03/10/2011 0538   ALT 45* 03/10/2011 0538   ALKPHOS 67 03/10/2011 0538   BILITOT 0.5 03/10/2011 0538   GFRNONAA 5* 03/10/2011 0538   GFRAA 6* 03/10/2011 0538   No intake or output data in the 24 hours ending 03/10/11 1513  CBG (last 3)   Basename 03/10/11 1131 03/10/11 0753 03/09/11 2055  GLUCAP 183* 162* 137*     Diet Order: Regular   Supplements/Tube Feeding:none at this time  IVF:    sodium chloride Last Rate: 125 mL/hr at 03/10/11 1446  DISCONTD: dextrose 5 % and 0.45% NaCl 1,000 mL with sodium bicarbonate 50 mEq infusion Last Rate: 150 mL/hr at 03/10/11 1025    Estimated Nutritional Needs:   Kcal:1430-1625 Protein:65-71 gr Fluid:1.4-1.6 L/d  NUTRITION DIAGNOSIS: -Inadequate oral intake (NI-2.1).  Status: Ongoing  RELATED TO: decreased appetite  AS EVIDENCE BY: pt hx and observed meal intake  MONITORING: oral intake meals and suppl, labs and wt changes  GOAL: Pt will improve oral intake to meet at least minimum est nutritional needs   EDUCATION NEEDS: -Education not appropriate at this time  INTERVENTION: -Public librarian Cup with meals  Dietitian [phone]  DOCUMENTATION CODES Per approved criteria  -Not Applicable    Francene Boyers 03/10/2011, 3:11 PM

## 2011-03-10 NOTE — Plan of Care (Signed)
Problem: Phase I Progression Outcomes Goal: Hemodynamically stable Outcome: Progressing HR continues in the 30's

## 2011-03-11 LAB — GLUCOSE, CAPILLARY
Glucose-Capillary: 134 mg/dL — ABNORMAL HIGH (ref 70–99)
Glucose-Capillary: 129 mg/dL — ABNORMAL HIGH (ref 70–99)
Glucose-Capillary: 200 mg/dL — ABNORMAL HIGH (ref 70–99)

## 2011-03-11 LAB — CBC
Hemoglobin: 10.2 g/dL — ABNORMAL LOW (ref 12.0–15.0)
MCH: 28.6 pg (ref 26.0–34.0)
MCHC: 34.3 g/dL (ref 30.0–36.0)
RDW: 16.1 % — ABNORMAL HIGH (ref 11.5–15.5)

## 2011-03-11 LAB — BASIC METABOLIC PANEL
CO2: 25 mEq/L (ref 19–32)
Chloride: 106 mEq/L (ref 96–112)
Creatinine, Ser: 4.08 mg/dL — ABNORMAL HIGH (ref 0.50–1.10)
Sodium: 142 mEq/L (ref 135–145)

## 2011-03-11 LAB — FERRITIN: Ferritin: 4296 ng/mL — ABNORMAL HIGH (ref 10–291)

## 2011-03-11 LAB — IRON AND TIBC: Iron: 39 ug/dL — ABNORMAL LOW (ref 42–135)

## 2011-03-11 MED ORDER — ENSURE CLINICAL ST REVIGOR PO LIQD
237.0000 mL | Freq: Three times a day (TID) | ORAL | Status: DC
  Administered 2011-03-11 – 2011-03-12 (×2): 237 mL via ORAL

## 2011-03-11 NOTE — Evaluation (Signed)
Physical Therapy Evaluation Patient Details Name: [name] [name] MRN: [id number] DOB: 12-03-1917 Today's Date: 03/11/2011  Problem List:  Patient Active Problem List  Diagnoses  . SPONDYLOSIS  . SCIATICA  . SCOLIOSIS-IDIOPATHIC  . SPONDYLOLITHESIS  . HTN (hypertension)  . Dizziness  . Cerebellar infarct  . Bradycardia  . Nausea and vomiting  . Diabetes mellitus, type II  . Hyperkalemia  . Acute renal failure  . Hypotension  . Anemia  . Coagulopathy    Past Medical History:  Past Medical History  Diagnosis Date  . Hypertension   . Arthritis   . Gout spondylitis   . H/O: CVA (cardiovascular accident) 02/19/2011    Cerebellar distribution  . Dizziness   . Diabetes mellitus type II 02/19/2011  . Sciatica    Past Surgical History:  Past Surgical History  Procedure Date  . Abdominal hysterectomy     PT Assessment/Plan/Recommendation PT Assessment Clinical Impression Statement: according to family, pt was unhappy at [facility] and they brought her home after a few days...she is significantly improved since evaluation in early Jan. with no gait ataxia or dizziness noted...HR is in the mid 60s throughout tx...family plans to take her home at d/c...no SME needed...will plan to see acutely to maintain max functional mobility PT Recommendation/Assessment: Patient will need skilled PT in the acute care venue PT Problem List: Decreased strength;Decreased activity tolerance;Decreased mobility;Cardiopulmonary status limiting activity Barriers to Discharge: None PT Therapy Diagnosis : Difficulty walking;Generalized weakness PT Plan PT Frequency: Min 3X/week PT Treatment/Interventions: Gait training;Functional mobility training;Therapeutic activities;Therapeutic exercise PT Recommendation Follow Up Recommendations: Home health PT;Skilled nursing facility Equipment Recommended: Defer to next venue PT Goals  Acute Rehab PT Goals PT Goal Formulation: With patient/family Pt will go  Supine/Side to Sit: with supervision Pt will Ambulate: 16 - 50 feet;with supervision PT Goal: Ambulate - Progress: Goal set today  PT Evaluation Precautions/Restrictions  Precautions Precautions: Fall Required Braces or Orthoses: No Restrictions Weight Bearing Restrictions: No Prior Functioning  Home Living Lives With: Sheran Spine Help From: Family Type of Home: House Home Layout: One level   Cognition Cognition Arousal/Alertness: Awake/alert Overall Cognitive Status: Appears within functional limits for tasks assessed Orientation Level: Oriented X4 Sensation/Coordination Sensation Stereognosis: Not tested Hot/Cold: Not tested Proprioception: Appears Intact Coordination Gross Motor Movements are Fluid and Coordinated: Yes Fine Motor Movements are Fluid and Coordinated: Yes Extremity Assessment RLE Assessment RLE Assessment: Within Functional Limits LLE Assessment LLE Assessment: Exceptions to [facility] LLE Strength LLE Overall Strength Comments: strength generally 3/5 with 0/5 L anterior tibialis Mobility (including Balance) Bed Mobility Supine to Sit: 4: Min assist;HOB elevated (Comment degrees) (Hob at 60 deg) Supine to Sit Details (indicate cue type and reason): needs assist  to guide trunk for correct position Sitting - Scoot to Edge of Bed: 6: Modified independent (Device/Increase time) Transfers Transfers: Yes Stand to Sit: 5: Supervision Stand to Sit Details: cues for correct hand placement Ambulation/Gait Ambulation/Gait: Yes Ambulation/Gait Assistance: 5: Supervision Ambulation/Gait Assistance Details (indicate cue type and reason): minimally ambulatory at home PTA ...cues for erect stance, staying inside of walker Ambulation Distance (Feet): 20 Feet Assistive device: Rolling walker Gait Pattern: Decreased dorsiflexion - left;Within Functional Limits;Trunk flexed Gait velocity: WNL Stairs: No Wheelchair Mobility Wheelchair Mobility: No  Posture/Postural  Control Posture/Postural Control: No significant limitations Static Sitting Balance Static Sitting - Level of Assistance: 7: Independent Dynamic Sitting Balance Dynamic Sitting - Level of Assistance: 6: Modified independent (Device/Increase time) Static Standing Balance Static Standing - Level of Assistance: 5: Stand  by assistance Exercise    End of Session PT - End of Session Equipment Utilized During Treatment: Gait belt Activity Tolerance: Patient tolerated treatment well Patient left: in bed;with call bell in reach;with bed alarm set;with family/visitor present General Behavior During Session: [facility] for tasks performed Cognition: [facility] for tasks performed  Konrad Penta 03/11/2011, 2:37 PM

## 2011-03-11 NOTE — Progress Notes (Signed)
Subjective: "I feel better. "Nausea resolved. Tolerating diet. Has not yet been out of bed.  Objective: Vital signs in last 24 hours: Filed Vitals:   03/10/11 2133 03/10/11 2200 03/11/11 0500 03/11/11 0838  BP: 112/58  109/54   Pulse: 41  67 63  Temp:  98.1 F (36.7 C) 98.2 F (36.8 C)   TempSrc:  Oral Oral   Resp:   20 20  Height:      Weight:   64.7 kg (142 lb 10.2 oz)   SpO2:   91% 93%   Weight change: -2.886 kg (-6 lb 5.8 oz)  Intake/Output Summary (Last 24 hours) at 03/11/11 1212 Last data filed at 03/11/11 0612  Gross per 24 hour  Intake 1329.17 ml  Output    600 ml  Net 729.17 ml   Telemetry shows normal sinus rhythm with a rate in the 60s  Physical Exam: General: Brighter. More responsive. Appropriate. Eating lunch with the family's insistence. Lungs clear to auscultation bilaterally without wheeze rhonchi or rales Cardiovascular regular rate rhythm without murmurs gallops rubs Abdomen soft nontender nondistended Extremities no clubbing cyanosis or edema  Lab Results: Basic Metabolic Panel:  Lab 03/11/11 1610 03/10/11 0538  NA 142 132*  K 4.1 4.4  CL 106 98  CO2 25 20  GLUCOSE 128* 180*  BUN 57* 73*  CREATININE 4.08* 6.37*  CALCIUM 8.8 9.1  MG -- --  PHOS -- --   Liver Function Tests:  Lab 03/10/11 0538 03/09/11 1109  AST 54* 21  ALT 45* 15  ALKPHOS 67 75  BILITOT 0.5 0.5  PROT 6.1 6.9  ALBUMIN 3.0* 3.5   No results found for this basename: LIPASE:2,AMYLASE:2 in the last 168 hours No results found for this basename: AMMONIA:2 in the last 168 hours CBC:  Lab 03/11/11 0540 03/10/11 0538  WBC 6.6 6.9  NEUTROABS -- --  HGB 10.2* 9.7*  HCT 29.7* 28.2*  MCV 83.2 81.7  PLT 166 191   Cardiac Enzymes:  Lab 03/09/11 1702 03/09/11 1135  CKTOTAL 85 --  CKMB 4.0 --  CKMBINDEX -- --  TROPONINI <0.30 <0.30   BNP: No results found for this basename: PROBNP:3 in the last 168 hours D-Dimer: No results found for this basename: DDIMER:2 in  the last 168 hours CBG:  Lab 03/11/11 1133 03/11/11 0728 03/10/11 2109 03/10/11 1700 03/10/11 1131 03/10/11 0753  GLUCAP 171* 134* 130* 120* 183* 162*   Hemoglobin A1C: No results found for this basename: HGBA1C in the last 168 hours Fasting Lipid Panel: No results found for this basename: CHOL,HDL,LDLCALC,TRIG,CHOLHDL,LDLDIRECT in the last 960 hours Thyroid Function Tests: No results found for this basename: TSH,T4TOTAL,FREET4,T3FREE,THYROIDAB in the last 168 hours Coagulation:  Lab 03/09/11 1109  LABPROT 17.0*  INR 1.36   Anemia Panel: No results found for this basename: VITAMINB12,FOLATE,FERRITIN,TIBC,IRON,RETICCTPCT in the last 168 hours Urine Drug Screen: Drugs of Abuse     Component Value Date/Time   LABOPIA NONE DETECTED 02/20/2011 0549   COCAINSCRNUR NONE DETECTED 02/20/2011 0549   LABBENZ NONE DETECTED 02/20/2011 0549   AMPHETMU NONE DETECTED 02/20/2011 0549   THCU NONE DETECTED 02/20/2011 0549   LABBARB NONE DETECTED 02/20/2011 0549      Micro Results: No results found for this or any previous visit (from the past 240 hour(s)). Studies/Results: Ct Head Wo Contrast  03/09/2011  *RADIOLOGY REPORT*  Clinical Data: Altered L O C, prior CVA  CT HEAD WITHOUT CONTRAST  Technique:  Contiguous axial images were obtained from the  base of the skull through the vertex without contrast.  Comparison: 02/20/2011  Findings: No skull fracture is noted.  There is mild mucosal thickening in the right maxillary sinus.  The mastoid air cells are unremarkable.  Again noted expected evolution of the right cerebellar infarct with slight decrease in mass effect on the right fourth ventricle.  No definite new infarct is noted.  Stable cerebral atrophy and chronic white matter disease.  Bilateral basal ganglia punctate calcifications are again noted.  IMPRESSION: Mild mucosal thickening right maxillary sinus.Again noted expected evolution of the right cerebellar infarct with slight decrease in mass effect on  the right fourth ventricle.  No definite new infarct is noted.  Stable cerebral atrophy and chronic white matter disease.  Original Report Authenticated By: Natasha Mead, M.D.   US Renal  03/09/2011  *RADIOLOGY REPORT*  Clinical Data: Acute renal failure  RENAL/URINARY TRACT ULTRASOUND COMPLETE  Comparison:  None.  Findings:  Right Kidney:  9 cm in length. No hydronephrosis.  Well-preserved cortex.  Normal size and parenchymal echotexture without focal abnormalities.  Left Kidney:  8.5 cm length.  22 x 23 x 27 mm simple appearing cyst in the upper pole.  No hydronephrosis or solid renal lesion.  Bladder:  Decompressed by Foley catheter  IMPRESSION:  1.  Negative for hydronephrosis. 2.  2.7 cm left renal cyst.  Original Report Authenticated By: Thora Lance III, M.D.   Scheduled Meds:    . docusate sodium  100 mg Oral BID  . feeding supplement  237 mL Oral TID BM  . insulin aspart  0-5 Units Subcutaneous QHS  . insulin aspart  0-9 Units Subcutaneous TID WC  . DISCONTD: famotidine (PEPCID) IV  20 mg Intravenous Q24H  . DISCONTD: furosemide  10 mg Intravenous Q12H   Continuous Infusions:    . sodium chloride 125 mL/hr at 03/11/11 0813  . DISCONTD: dextrose 5 % and 0.45% NaCl 1,000 mL with sodium bicarbonate 50 mEq infusion 150 mL/hr at 03/10/11 1025   PRN Meds:.acetaminophen, albuterol, meclizine, morphine, ondansetron (ZOFRAN) IV, DISCONTD: acetaminophen, DISCONTD: alum & mag hydroxide-simeth Assessment/Plan: Principal Problem:  *Bradycardia, resolved Active Problems:  Acute renal failure: No signs of obstruction. Most likely prerenal azotemia in the setting of ACE inhibitor and loop diuretic, improving  HTN (hypertension): Medications held  Cerebellar infarct: Nausea improved today. If still problematic, can try a scopolamine patch  Diabetes mellitus, type II: Metformin held due to renal failure. Continue sliding scale for now. Change IV fluids to saline.  Anemia: Worse than previous.  Her indices are borderline microcytic. I will check an iron TIBC and ferritin  Nausea and vomiting improved. Advance diet  Hyperkalemia resolved  Hypotension resolved  Coagulopathy DO NOT RESUSCITATE.  Continue current. Out of bed to chair. Not yet ready for discharge. Will get PT and OT consult. Discussed with family.   LOS: 2 days   Rasheem Figiel L 03/11/2011, 12:12 PM

## 2011-03-12 ENCOUNTER — Other Ambulatory Visit: Payer: Self-pay

## 2011-03-12 LAB — BASIC METABOLIC PANEL
BUN: 33 mg/dL — ABNORMAL HIGH (ref 6–23)
Chloride: 113 mEq/L — ABNORMAL HIGH (ref 96–112)
GFR calc Af Amer: 25 mL/min — ABNORMAL LOW (ref >90)
GFR calc non Af Amer: 21 mL/min — ABNORMAL LOW (ref >90)
Potassium: 3.4 mEq/L — ABNORMAL LOW (ref 3.5–5.1)
Sodium: 145 mEq/L (ref 135–145)

## 2011-03-12 LAB — GLUCOSE, CAPILLARY: Glucose-Capillary: 158 mg/dL — ABNORMAL HIGH (ref 70–99)

## 2011-03-12 NOTE — Progress Notes (Signed)
Subjective: Feels great. No nausea.  Objective: Vital signs in last 24 hours: Filed Vitals:   03/11/11 0838 03/11/11 1500 03/11/11 2144 03/12/11 0525  BP:  112/64 120/68 129/61  Pulse: 63 65 78 82  Temp:  98.3 F (36.8 C) 98.6 F (37 C) 98.1 F (36.7 C)  TempSrc:   Oral Oral  Resp: 20 16 18 18   Height:      Weight:    68.8 kg (151 lb 10.8 oz)  SpO2: 93% 90% 95% 92%   Weight change: 4.1 kg (9 lb 0.6 oz)  Intake/Output Summary (Last 24 hours) at 03/12/11 1038 Last data filed at 03/12/11 0530  Gross per 24 hour  Intake 3802.5 ml  Output   2100 ml  Net 1702.5 ml   Telemetry shows normal sinus rhythm with a rate in the 60s  Physical Exam: General: Smiling. Up in chair. Lungs clear to auscultation bilaterally without wheeze rhonchi or rales Cardiovascular regular rate rhythm without murmurs gallops rubs Abdomen soft nontender nondistended Extremities no clubbing cyanosis or edema  Lab Results: Basic Metabolic Panel:  Lab 03/12/11 1610 03/11/11 0540  NA 145 142  K 3.4* 4.1  CL 113* 106  CO2 25 25  GLUCOSE 112* 128*  BUN 33* 57*  CREATININE 1.96* 4.08*  CALCIUM 8.3* 8.8  MG -- --  PHOS -- --   Liver Function Tests:  Lab 03/10/11 0538 03/09/11 1109  AST 54* 21  ALT 45* 15  ALKPHOS 67 75  BILITOT 0.5 0.5  PROT 6.1 6.9  ALBUMIN 3.0* 3.5   No results found for this basename: LIPASE:2,AMYLASE:2 in the last 168 hours No results found for this basename: AMMONIA:2 in the last 168 hours CBC:  Lab 03/11/11 0540 03/10/11 0538  WBC 6.6 6.9  NEUTROABS -- --  HGB 10.2* 9.7*  HCT 29.7* 28.2*  MCV 83.2 81.7  PLT 166 191   Cardiac Enzymes:  Lab 03/09/11 1702 03/09/11 1135  CKTOTAL 85 --  CKMB 4.0 --  CKMBINDEX -- --  TROPONINI <0.30 <0.30   BNP: No results found for this basename: PROBNP:3 in the last 168 hours D-Dimer: No results found for this basename: DDIMER:2 in the last 168 hours CBG:  Lab 03/12/11 0730 03/11/11 2109 03/11/11 1642 03/11/11 1133  03/11/11 0728 03/10/11 2109  GLUCAP 104* 200* 129* 171* 134* 130*   Hemoglobin A1C: No results found for this basename: HGBA1C in the last 168 hours Fasting Lipid Panel: No results found for this basename: CHOL,HDL,LDLCALC,TRIG,CHOLHDL,LDLDIRECT in the last 960 hours Thyroid Function Tests: No results found for this basename: TSH,T4TOTAL,FREET4,T3FREE,THYROIDAB in the last 168 hours Coagulation:  Lab 03/09/11 1109  LABPROT 17.0*  INR 1.36   Anemia Panel:  Lab 03/11/11 0540  VITAMINB12 --  FOLATE --  FERRITIN 4296*  TIBC 169*  IRON 39*  RETICCTPCT --   Urine Drug Screen: Drugs of Abuse     Component Value Date/Time   LABOPIA NONE DETECTED 02/20/2011 0549   COCAINSCRNUR NONE DETECTED 02/20/2011 0549   LABBENZ NONE DETECTED 02/20/2011 0549   AMPHETMU NONE DETECTED 02/20/2011 0549   THCU NONE DETECTED 02/20/2011 0549   LABBARB NONE DETECTED 02/20/2011 0549      Micro Results: Recent Results (from the past 240 hour(s))  URINE CULTURE     Status: Normal   Collection Time   03/10/11  3:10 AM      Component Value Range Status Comment   Specimen Description URINE, CATHETERIZED   Final    Special Requests  NONE   Final    Setup Time [id number]   Final    Colony Count NO GROWTH   Final    Culture NO GROWTH   Final    Report Status 03/11/2011 FINAL   Final    Studies/Results: No results found. Scheduled Meds:    . docusate sodium  100 mg Oral BID  . feeding supplement  237 mL Oral TID BM  . insulin aspart  0-5 Units Subcutaneous QHS  . insulin aspart  0-9 Units Subcutaneous TID WC  . DISCONTD: famotidine (PEPCID) IV  20 mg Intravenous Q24H   Continuous Infusions:    . DISCONTD: sodium chloride 125 mL/hr at 03/12/11 0904   PRN Meds:.acetaminophen, albuterol, meclizine, morphine, ondansetron (ZOFRAN) IV, DISCONTD: acetaminophen Assessment/Plan: Principal Problem:  *Bradycardia, resolved Active Problems:  Acute renal failure: No signs of obstruction. Most likely prerenal  azotemia in the setting of ACE inhibitor and loop diuretic, improving  HTN (hypertension): Medications held  Cerebellar infarct: Nausea improved today. If still problematic, can try a scopolamine patch  Diabetes mellitus, type II: Metformin held due to renal failure. Continue sliding scale for now. Change IV fluids to saline.  Anemia: Worse than previous. Her indices are borderline microcytic. I will check an iron TIBC and ferritin  Nausea and vomiting improved. Advance diet  Hyperkalemia resolved  Hypotension resolved  Coagulopathy DO NOT RESUSCITATE.  Renal failure nearly resolved. Strength improved. Bradycardia resolved. Will check a 12-lead EKG. Discontinue IV fluids and monitor for another 24 hours. Home tomorrow if stable.   LOS: 3 days   [name] [name] L 03/12/2011, 10:38 AM

## 2011-03-12 NOTE — Progress Notes (Signed)
Physical Therapy Treatment Patient Details Name: [name] [name] MRN: [id number] DOB: 23-Oct-1917 Today's Date: 03/12/2011 Time: 9:20-9:52 Charge: TA 30 min  PT Assessment/Plan  PT - Assessment/Plan Comments on Treatment Session: Pt very coorperative and completed all exercises correctly with no cueing required.  Ambulated 24' with RW with supervision, cueing for posture and to stay in walker. PT Goals  Acute Rehab PT Goals Pt will go Sit to Stand: with supervision PT Goal: Sit to Stand - Progress: Met Pt will go Stand to Sit: with supervision PT Goal: Stand to Sit - Progress: Met Pt will Ambulate: 16 - 50 feet PT Goal: Ambulate - Progress: Progressing toward goal  PT Treatment Precautions/Restrictions  Precautions Precautions: Fall Required Braces or Orthoses: No Restrictions Weight Bearing Restrictions: No Mobility (including Balance) Bed Mobility Sitting - Scoot to Edge of Bed: 6: Modified independent (Device/Increase time) Transfers Sit to Stand: 5: Supervision Sit to Stand Details (indicate cue type and reason): cueing for hand placement to assist with scoot to edge of chair, use arm to assist standing Stand to Sit: 5: Supervision Stand to Sit Details: cues for hand placement Ambulation/Gait Ambulation/Gait: Yes Ambulation/Gait Assistance: 5: Supervision Ambulation/Gait Assistance Details (indicate cue type and reason): cueing for posture Ambulation Distance (Feet): 24 Feet Assistive device: Rolling walker Gait Pattern: Decreased stride length;Decreased dorsiflexion - left;Trunk flexed    Exercise  General Exercises - Lower Extremity Ankle Circles/Pumps: AROM;Right;AAROM;Left;10 reps;Seated Quad Sets: AROM;Both;10 reps;Seated Long Arc Quad: Strengthening;AROM;Both;10 reps;Seated Heel Slides: AROM;Both;10 reps;Seated Hip ABduction/ADduction: Seated;AROM;Strengthening;Both;10 reps End of Session PT - End of Session Equipment Utilized During Treatment: Gait  belt Activity Tolerance: Patient tolerated treatment well Patient left: in chair;with call bell in reach;with bed alarm set Nurse Communication: Mobility status for transfers General Behavior During Session: [facility] for tasks performed Cognition: [facility] for tasks performed  Juel Burrow 03/12/2011, 9:52 AM

## 2011-03-12 NOTE — Progress Notes (Signed)
Checked patient on RA this evening. 92%. No complaints of SOB or difficulty breathing. Will continue to monitor.

## 2011-03-13 LAB — GLUCOSE, CAPILLARY
Glucose-Capillary: 180 mg/dL — ABNORMAL HIGH (ref 70–99)
Glucose-Capillary: 114 mg/dL — ABNORMAL HIGH (ref 70–99)

## 2011-03-13 LAB — BASIC METABOLIC PANEL
Chloride: 113 mEq/L — ABNORMAL HIGH (ref 96–112)
GFR calc non Af Amer: 36 mL/min — ABNORMAL LOW (ref >90)
Glucose, Bld: 124 mg/dL — ABNORMAL HIGH (ref 70–99)
Potassium: 3.2 mEq/L — ABNORMAL LOW (ref 3.5–5.1)
Sodium: 146 mEq/L — ABNORMAL HIGH (ref 135–145)

## 2011-03-13 MED ORDER — POTASSIUM CHLORIDE 10 MEQ/100ML IV SOLN: 10.0000 meq | INTRAVENOUS | Status: AC

## 2011-03-13 MED ORDER — POTASSIUM CHLORIDE CRYS ER 10 MEQ PO TBCR: 10.0000 meq | EXTENDED_RELEASE_TABLET | Freq: Every day | ORAL | Status: DC

## 2011-03-13 NOTE — Progress Notes (Signed)
03/13/2011  Patient:  [name] [name]   Account Number:  [id number]  Date Initiated:  03/10/2011  Documentation initiated by:  [name] [name]  Subjective/Objective Assessment:   78 yr old female admitted from home with loc, hyperkalemia, renal failure, brycardia. lives with her husband. pt has [facility] r[facility] and aide from [facility] [place] 2 hours daily     Action/Plan:   spoke to pt, husband and son who feel  pt need more help in the home. pt d/c from [facility] after 3 day stay.   Anticipated DC Date:  03/13/2011   Anticipated DC Plan:  HOME W HOME HEALTH SERVICES      DC Planning Services  CM consult      [facility] Choice  HOME HEALTH   Choice offered to / List presented to:  C-1 Patient        HH arranged  HH-1 RN  HH-2 PT  HH-3 OT      [facility] agency  [facility]   Status of service:   Medicare Important Message given?   (If response is "NO", the following Medicare IM given date fields will be blank) Date Medicare IM given:   Date Additional Medicare IM given:    Discharge Disposition:  HOME W HOME HEALTH SERVICES  Per UR Regulation:    Comments:  03/13/2011 [name] Corning rn [facility] pt d/c home today. notified [name] [name] of [facility] of d/c sent new orders through [facility].  03/10/2011 [name] Corning rn [facility] contacted by [name] [name] of [facility] home health agency that they are following pt at home. and will be able to follow her at d/c.  [name] [name] [phone]

## 2011-03-13 NOTE — Progress Notes (Signed)
Patient discharge instructions given and patient and family verbalize understanding. IV cath removed and intact patient tolerates well.

## 2011-03-13 NOTE — Discharge Summary (Addendum)
Physician Discharge Summary  Patient ID: [name] [name] MRN: [id number] DOB/AGE: 76-Nov-1919 76 y.o.  Admit date: 03/09/2011 Discharge date: 03/13/2011  Discharge Diagnoses:  Principal Problem:  *Bradycardia Active Problems:  Acute renal failure  HTN (hypertension)  Cerebellar infarct  Diabetes mellitus, type II  Anemia of chronic disease  Nausea and vomiting  Hyperkalemia  Hypotension  Coagulopathy debility  Medication List  As of 03/13/2011  1:05 PM   STOP taking these medications         amLODipine-benazepril 10-40 MG per capsule      atenolol 100 MG tablet      furosemide 20 MG tablet      metFORMIN 500 MG tablet      traMADol 50 MG tablet         TAKE these medications         allopurinol 100 MG tablet   Commonly known as: ZYLOPRIM   Take 300 mg by mouth at bedtime.      aspirin 325 MG tablet   Take 1 tablet (325 mg total) by mouth daily.      clopidogrel 75 MG tablet   Commonly known as: PLAVIX   Take 1 tablet (75 mg total) by mouth daily with breakfast.      colchicine 0.6 MG tablet   Take 0.6 mg by mouth daily as needed. gout      famotidine 20 MG tablet   Commonly known as: PEPCID   Take 1 tablet (20 mg total) by mouth daily.      meclizine 25 MG tablet   Commonly known as: ANTIVERT   Take 25 mg by mouth 3 (three) times daily as needed. For nausea      tolterodine 2 MG 24 hr capsule   Commonly known as: DETROL LA   Take 2 mg by mouth daily.            Discharge Orders    Future Orders Please Complete By Expires   Diet - low sodium heart healthy      Diet Carb Modified      Discharge instructions      Comments:   Drink plenty of fluids.   Walker       Walk with assistance         Follow-up Information    Follow up with [facility]. [phone])       Follow up with COMSTOCK,LLOYD, MD in 1 week. (2 check basic metabolic panel. (Blood work ))    Solicitor information:   [address] [place]  [address] [phone]          Disposition: Home or Self Care  Discharged Condition: stable  Consults:  PT  Labs:   Results for orders placed during the hospital encounter of 03/09/11 (from the past 48 hour(s))  GLUCOSE, CAPILLARY     Status: Abnormal   Collection Time   03/11/11  4:42 PM      Component Value Range Comment   Glucose-Capillary 129 (*) 70 - 99 (mg/dL)    Comment 1 Notify RN      Comment 2 Documented in Chart     GLUCOSE, CAPILLARY     Status: Abnormal   Collection Time   03/11/11  9:09 PM      Component Value Range Comment   Glucose-Capillary 200 (*) 70 - 99 (mg/dL)    Comment 1 Documented in Chart      Comment 2 Notify RN  BASIC METABOLIC PANEL     Status: Abnormal   Collection Time   03/12/11  5:15 AM      Component Value Range Comment   Sodium 145  135 - 145 (mEq/L)    Potassium 3.4 (*) 3.5 - 5.1 (mEq/L) DELTA CHECK NOTED   Chloride 113 (*) 96 - 112 (mEq/L)    CO2 25  19 - 32 (mEq/L)    Glucose, Bld 112 (*) 70 - 99 (mg/dL)    BUN 33 (*) 6 - 23 (mg/dL)    Creatinine, Ser 7.82 (*) 0.50 - 1.10 (mg/dL)    Calcium 8.3 (*) 8.4 - 10.5 (mg/dL)    GFR calc non Af Amer 21 (*) >90 (mL/min)    GFR calc Af Amer 25 (*) >90 (mL/min)   GLUCOSE, CAPILLARY     Status: Abnormal   Collection Time   03/12/11  7:30 AM      Component Value Range Comment   Glucose-Capillary 104 (*) 70 - 99 (mg/dL)    Comment 1 Notify RN      Comment 2 Documented in Chart     GLUCOSE, CAPILLARY     Status: Abnormal   Collection Time   03/12/11 11:29 AM      Component Value Range Comment   Glucose-Capillary 158 (*) 70 - 99 (mg/dL)    Comment 1 Notify RN      Comment 2 Documented in Chart     GLUCOSE, CAPILLARY     Status: Abnormal   Collection Time   03/12/11  4:36 PM      Component Value Range Comment   Glucose-Capillary 180 (*) 70 - 99 (mg/dL)   GLUCOSE, CAPILLARY     Status: Abnormal   Collection Time   03/12/11  9:54 PM      Component Value Range Comment   Glucose-Capillary 115  (*) 70 - 99 (mg/dL)    Comment 1 Notify RN     BASIC METABOLIC PANEL     Status: Abnormal   Collection Time   03/13/11  5:21 AM      Component Value Range Comment   Sodium 146 (*) 135 - 145 (mEq/L)    Potassium 3.2 (*) 3.5 - 5.1 (mEq/L)    Chloride 113 (*) 96 - 112 (mEq/L)    CO2 25  19 - 32 (mEq/L)    Glucose, Bld 124 (*) 70 - 99 (mg/dL)    BUN 23  6 - 23 (mg/dL)    Creatinine, Ser 9.56 (*) 0.50 - 1.10 (mg/dL) DELTA CHECK NOTED   Calcium 8.6  8.4 - 10.5 (mg/dL)    GFR calc non Af Amer 36 (*) >90 (mL/min)    GFR calc Af Amer 41 (*) >90 (mL/min)   GLUCOSE, CAPILLARY     Status: Abnormal   Collection Time   03/13/11  7:30 AM      Component Value Range Comment   Glucose-Capillary 114 (*) 70 - 99 (mg/dL)     Diagnostics:  Dg Chest 1 View  03/09/2011  *RADIOLOGY REPORT*  Clinical Data: Vomiting, altered L O C  CHEST - 1 VIEW  Comparison: 02/19/2011  Findings: Cardiomegaly again noted.  No acute infiltrate or pleural effusion.  No pulmonary edema.  Atherosclerotic calcifications of the thoracic aorta.  Bony thorax is stable.  IMPRESSION: Cardiomegaly again noted.  No active disease.  Original Report Authenticated By: Natasha Mead, M.D.   Dg Chest 1 View  02/19/2011  *RADIOLOGY REPORT*  Clinical Data: Dizziness  CHEST -  1 VIEW  Comparison: None.  Findings: Borderline cardiomegaly noted.  No acute infiltrate or pleural effusion.  No pulmonary edema.  Atherosclerotic calcifications of thoracic aorta. Mild elevation of the right hemidiaphragm.  IMPRESSION: Borderline cardiomegaly.  No active disease.  Original Report Authenticated By: Natasha Mead, M.D.   Ct Head Wo Contrast  03/09/2011  *RADIOLOGY REPORT*  Clinical Data: Altered L O C, prior CVA  CT HEAD WITHOUT CONTRAST  Technique:  Contiguous axial images were obtained from the base of the skull through the vertex without contrast.  Comparison: 02/20/2011  Findings: No skull fracture is noted.  There is mild mucosal thickening in the right maxillary  sinus.  The mastoid air cells are unremarkable.  Again noted expected evolution of the right cerebellar infarct with slight decrease in mass effect on the right fourth ventricle.  No definite new infarct is noted.  Stable cerebral atrophy and chronic white matter disease.  Bilateral basal ganglia punctate calcifications are again noted.  IMPRESSION: Mild mucosal thickening right maxillary sinus.Again noted expected evolution of the right cerebellar infarct with slight decrease in mass effect on the right fourth ventricle.  No definite new infarct is noted.  Stable cerebral atrophy and chronic white matter disease.  Original Report Authenticated By: Natasha Mead, M.D.   Ct Head Wo Contrast  02/20/2011  *RADIOLOGY REPORT*  Clinical Data: Followup infarct.  Dizziness.  CT HEAD WITHOUT CONTRAST  Technique:  Contiguous axial images were obtained from the base of the skull through the vertex without contrast.  Comparison: 02/18/2010 CT and MRI.  Findings: Expected evolution of right cerebellar infarct with mild increased associated mass effect upon the surrounding structures including the fourth ventricle.  No evidence of hydrocephalus.  No intracranial hemorrhage.  Global atrophy.  Small vessel disease type changes.  No intracranial mass lesion detected on this unenhanced exam.  IMPRESSION: Expected evolution of right cerebellar infarct with mild increased associated mass effect upon the surrounding structures including the fourth ventricle.  No evidence of hydrocephalus or intracranial hemorrhage.  Original Report Authenticated By: Fuller Canada, M.D.   Ct Head Wo Contrast  02/19/2011  *RADIOLOGY REPORT*  Clinical Data: Nausea vomiting with dizziness since last night. High blood pressure.  CT HEAD WITHOUT CONTRAST  Technique:  Contiguous axial images were obtained from the base of the skull through the vertex without contrast.  Comparison: None.  Findings: Moderate sized acute/subacute non hemorrhagic right  cerebellar infarct involving the mid to inferior aspect of the right cerebellum.  Presently no associated hemorrhage or significant mass effect however as this progresses the patient is at risk for these complications.  Prominent small vessel disease type changes.  No intracranial mass lesion detected on this unenhanced exam.  Global atrophy without hydrocephalus.  Moderate mucosal thickening right maxillary sinus with wall thickening suggesting chronic sinusitis.  Vascular calcifications.  IMPRESSION: Moderate sized acute/subacute non hemorrhagic right cerebellar infarct as noted above.  Critical Value/emergent results were called by telephone at the time of interpretation on 02/19/2011 at  at 12:34 p.m.  to  Dr. Estell Harpin., who verbally acknowledged these results.  Original Report Authenticated By: Fuller Canada, M.D.   Mr Angiogram Head Wo Contrast  02/19/2011  *RADIOLOGY REPORT*  Clinical Data:  Dizziness with nausea and vomiting.  MRI HEAD WITH AND WITHOUT CONTRAST MRA HEAD WITHOUT CONTRAST  Technique: Multiplanar, multiecho pulse sequences of the brain and surrounding structures were obtained according to standard protocol with and without intravenous contrast.  Angiographic images of the Circle  of Willis were obtained using MRA technique without intravenous contrast.  Contrast: 14 ml MultiHance.  Comparison01/04/2011 CT.  No comparison MR.  MRI HEAD  Findings: Moderate size acute/subacute non hemorrhagic right mid to inferior cerebellar infarct (PICA distribution).  Local mass effect with mild narrowing of the fourth ventricle.  No hydrocephalus.  Tiny acute non hemorrhagic infarct posterior right opercular region.  This raises possibility of embolic disease.  Mild hemosiderin staining left parietal lobe may be related to the prior infarct or trauma.  Scattered tiny areas of blood breakdown products may be related to episode hemorrhagic ischemia without large blood collection.  Mild to slightly moderate  small vessel disease type changes.  Global atrophy.  No intracranial mass lesions or abnormal enhancement.  Transverse ligament hypertrophy.  Mild spinal stenosis C4-5 and C5- 6.  Right maxillary sinus moderate mucosal thickening.  IMPRESSION: Moderate size acute/subacute non hemorrhagic right mid to inferior cerebellar infarct (PICA distribution).  Local mass effect with mild narrowing of the fourth ventricle.  No hydrocephalus.  Tiny acute non hemorrhagic infarct posterior right opercular region.  This raises possibility of embolic disease.  Mild hemosiderin staining left parietal lobe may be related to the prior infarct or trauma.  Mild to slightly moderate small vessel disease type changes.  Global atrophy.  Right maxillary sinus moderate mucosal thickening.  MRA HEAD  Findings: Motion degraded exam.  Moderate irregularity with areas of narrowing and dilation involving portions of the pre cavernous, cavernous and supraclinoid aspect of the internal carotid artery bilaterally.  High-grade focal stenosis distal A1 segment of the left anterior cerebral artery.  Middle cerebral artery moderate branch vessel irregularity.  Ectatic vertebral arteries and basilar artery.  Artifact versus narrowing of the distal vertebral arteries bilaterally.  Nonvisualization right PICA consistent with the patient's acute infarct.  Moderate to marked tandem stenoses left PICA.  Nonvisualization AICAs.  Moderate narrowing irregularity of the superior cerebellar artery bilaterally.  Moderate to marked narrowing proximal posterior cerebral artery. Moderate narrowing and  irregularity distal aspect of the posterior cerebral arteries bilaterally  No saccular aneurysm.  IMPRESSION: Prominent intracranial atherosclerotic type changes as detailed above including nonvisualization of the right PICA corresponding to the patient's above described acute/subacute right cerebellar infarct.  Original Report Authenticated By: Fuller Canada, M.D.    Mr Laqueta Jean Wo Contrast  02/19/2011  *RADIOLOGY REPORT*  Clinical Data:  Dizziness with nausea and vomiting.  MRI HEAD WITH AND WITHOUT CONTRAST MRA HEAD WITHOUT CONTRAST  Technique: Multiplanar, multiecho pulse sequences of the brain and surrounding structures were obtained according to standard protocol with and without intravenous contrast.  Angiographic images of the Circle of Willis were obtained using MRA technique without intravenous contrast.  Contrast: 14 ml MultiHance.  Comparison01/04/2011 CT.  No comparison MR.  MRI HEAD  Findings: Moderate size acute/subacute non hemorrhagic right mid to inferior cerebellar infarct (PICA distribution).  Local mass effect with mild narrowing of the fourth ventricle.  No hydrocephalus.  Tiny acute non hemorrhagic infarct posterior right opercular region.  This raises possibility of embolic disease.  Mild hemosiderin staining left parietal lobe may be related to the prior infarct or trauma.  Scattered tiny areas of blood breakdown products may be related to episode hemorrhagic ischemia without large blood collection.  Mild to slightly moderate small vessel disease type changes.  Global atrophy.  No intracranial mass lesions or abnormal enhancement.  Transverse ligament hypertrophy.  Mild spinal stenosis C4-5 and C5- 6.  Right maxillary sinus moderate mucosal thickening.  IMPRESSION: Moderate size acute/subacute non hemorrhagic right mid to inferior cerebellar infarct (PICA distribution).  Local mass effect with mild narrowing of the fourth ventricle.  No hydrocephalus.  Tiny acute non hemorrhagic infarct posterior right opercular region.  This raises possibility of embolic disease.  Mild hemosiderin staining left parietal lobe may be related to the prior infarct or trauma.  Mild to slightly moderate small vessel disease type changes.  Global atrophy.  Right maxillary sinus moderate mucosal thickening.  MRA HEAD  Findings: Motion degraded exam.  Moderate irregularity  with areas of narrowing and dilation involving portions of the pre cavernous, cavernous and supraclinoid aspect of the internal carotid artery bilaterally.  High-grade focal stenosis distal A1 segment of the left anterior cerebral artery.  Middle cerebral artery moderate branch vessel irregularity.  Ectatic vertebral arteries and basilar artery.  Artifact versus narrowing of the distal vertebral arteries bilaterally.  Nonvisualization right PICA consistent with the patient's acute infarct.  Moderate to marked tandem stenoses left PICA.  Nonvisualization AICAs.  Moderate narrowing irregularity of the superior cerebellar artery bilaterally.  Moderate to marked narrowing proximal posterior cerebral artery. Moderate narrowing and  irregularity distal aspect of the posterior cerebral arteries bilaterally  No saccular aneurysm.  IMPRESSION: Prominent intracranial atherosclerotic type changes as detailed above including nonvisualization of the right PICA corresponding to the patient's above described acute/subacute right cerebellar infarct.  Original Report Authenticated By: Fuller Canada, M.D.   US Renal  03/09/2011  *RADIOLOGY REPORT*  Clinical Data: Acute renal failure  RENAL/URINARY TRACT ULTRASOUND COMPLETE  Comparison:  None.  Findings:  Right Kidney:  9 cm in length. No hydronephrosis.  Well-preserved cortex.  Normal size and parenchymal echotexture without focal abnormalities.  Left Kidney:  8.5 cm length.  22 x 23 x 27 mm simple appearing cyst in the upper pole.  No hydronephrosis or solid renal lesion.  Bladder:  Decompressed by Foley catheter  IMPRESSION:  1.  Negative for hydronephrosis. 2.  2.7 cm left renal cyst.  Original Report Authenticated By: Osa Craver, M.D.   US Carotid Duplex Bilateral  02/21/2011  *RADIOLOGY REPORT*  Clinical Data: Acute right cerebellar infarct.  BILATERAL CAROTID DUPLEX ULTRASOUND  Technique: Wallace Cullens scale imaging, color Doppler and duplex ultrasound was performed of  bilateral carotid and vertebral arteries in the neck.  Comparison:  02/20/2011  Criteria:  Quantification of carotid stenosis is based on velocity parameters that correlate the residual internal carotid diameter with NASCET-based stenosis levels, using the diameter of the distal internal carotid lumen as the denominator for stenosis measurement.  The following velocity measurements were obtained:                   PEAK SYSTOLIC/END DIASTOLIC RIGHT ICA:                        94/12cm/sec CCA:                        90/14cm/sec SYSTOLIC ICA/CCA RATIO:     0.93 DIASTOLIC ICA/CCA RATIO:    1.07 ECA:                        106cm/sec  LEFT ICA:                        88/14cm/sec CCA:  74/12cm/sec SYSTOLIC ICA/CCA RATIO:     1.18 DIASTOLIC ICA/CCA RATIO:    1.17 ECA:                        84cm/sec  Findings:  RIGHT CAROTID ARTERY: Very minor intimal thickening / early atherosclerosis.  No hemodynamically significant right ICA stenosis, velocity elevation, or turbulent flow.  RIGHT VERTEBRAL ARTERY:  Antegrade  LEFT CAROTID ARTERY: Very minor carotid atherosclerosis.  No hemodynamically significant left ICA stenosis, velocity elevation, or turbulent flow.  LEFT VERTEBRAL ARTERY:  Antegrade  IMPRESSION: Minor carotid atherosclerosis.  No hemodynamically significant ICA stenosis on either side.  Original Report Authenticated By: Judie Petit. Ruel Favors, M.D.    Procedures:  none  03/09/11 EKG: Idioventricular rhythm Left axis deviation Non-specific intra-ventricular conduction block Cannot rule out Anterior infarct , age undetermined  03/12/11 EKG Normal sinus rhythm Non-specific intra-ventricular conduction block Possible Anterolateral infarct , age undetermined  DNR   Hospital Course:  See H&P for complete admission details. The patient is an elderly black female with a recent cerebellar stroke who presented with nausea and vomiting. She is very weak. She had been discharged to skilled nursing  facility, but didn't like it so left to the care of her family members after only a few days. In the emergency room, her pulse was 32. Blood pressure 90/58. Vital signs were otherwise unremarkable. She was ill-appearing. Dry mucous membranes. Heart was regular, distant, bradycardic. Bowel sounds were hypoactive. She had trace pedal edema. Labs were significant for a sodium of 131 potassium is 6.0 chloride of 95 BUN was 75 and creatinine was 7.27. Echocardiogram showed severe bradycardia and idioventricular rhythm. Patient refused transfer, and pacemaker evaluation. Her CODE STATUS is DO NOT RESUSCITATE. Therefore, she was kept at [facility] and treated medically for renal failure and hyperkalemia and bradycardia. She had been discharged off atenolol, but the admitting physician suspects that this had been resumed at home. Also, she was on benazepril and Lasix. Her potassium was treated with an amp of bicarbonate IV, calcium IV, dextrose and insulin. She was given Kayexalate, IV fluids and Lasix. She was given supportive care with antibiotics as needed. She was monitored on telemetry. Her heart rate slowly improved. Her renal failure has nearly resolved. Her hyperkalemia is corrected and today she is hypokalemic. She has received a dose of potassium IV and by mouth prior to discharge. She worked with physical therapy. She wishes to go back home, and there are multiple family members who have been available daily to assist in her care. Home physical therapy, occupational therapy, and nursing will be continued at home. She was taken off her metformin because of the renal failure. She really has had no significant hyperglycemia and will remain off this until she follows up with her primary care physician. Her blood pressure has been normal off all of her antihypertensives so these have been stopped as well. She will need to followup with Dr. Jorene Guest to check blood pressure, diabetes control, and basic  metabolic panel in a week. Patient initially was very lethargic. With IV hydration, and increase in her heart rate, she is about back to her baseline mental status. Her nausea has not been a problem for the past several days and she is tolerating a regular diet. Her strength has improved and she's been out of bed with physical therapy. Total time on the day of discharge was greater than 30 minutes.  Discharge Exam:  Blood pressure  128/71, pulse 80, temperature 98.9 F (37.2 C), temperature source Oral, resp. rate 20, height 5\' 3"  (1.6 m), weight 68.8 kg (151 lb 10.8 oz), SpO2 90.00%.  Exam unchanged from 03/12/2011   Signed: Crista Curb L 03/13/2011, 1:05 PM

## 2014-07-12 ENCOUNTER — Emergency Department (HOSPITAL_COMMUNITY)
Admission: EM | Admit: 2014-07-12 | Discharge: 2014-07-12 | Disposition: A | Discharge status: Home / Self Care | Payer: Medicare Other | Attending: Emergency Medicine | Admitting: Emergency Medicine

## 2014-07-12 ENCOUNTER — Encounter (HOSPITAL_COMMUNITY): Payer: Self-pay | Admitting: *Deleted

## 2014-07-12 DIAGNOSIS — M109 Gout, unspecified: Secondary | ICD-10-CM | POA: Insufficient documentation

## 2014-07-12 DIAGNOSIS — Z880 Allergy status to penicillin: Secondary | ICD-10-CM | POA: Diagnosis not present

## 2014-07-12 DIAGNOSIS — I10 Essential (primary) hypertension: Secondary | ICD-10-CM | POA: Diagnosis not present

## 2014-07-12 DIAGNOSIS — Z79899 Other long term (current) drug therapy: Secondary | ICD-10-CM | POA: Diagnosis not present

## 2014-07-12 DIAGNOSIS — Z8673 Personal history of transient ischemic attack (TIA), and cerebral infarction without residual deficits: Secondary | ICD-10-CM | POA: Diagnosis not present

## 2014-07-12 DIAGNOSIS — Z7902 Long term (current) use of antithrombotics/antiplatelets: Secondary | ICD-10-CM | POA: Diagnosis not present

## 2014-07-12 DIAGNOSIS — M1990 Unspecified osteoarthritis, unspecified site: Secondary | ICD-10-CM | POA: Diagnosis not present

## 2014-07-12 DIAGNOSIS — E119 Type 2 diabetes mellitus without complications: Secondary | ICD-10-CM | POA: Diagnosis not present

## 2014-07-12 DIAGNOSIS — R21 Rash and other nonspecific skin eruption: Secondary | ICD-10-CM | POA: Diagnosis present

## 2014-07-12 DIAGNOSIS — Z7982 Long term (current) use of aspirin: Secondary | ICD-10-CM | POA: Diagnosis not present

## 2014-07-12 DIAGNOSIS — L539 Erythematous condition, unspecified: Secondary | ICD-10-CM | POA: Insufficient documentation

## 2014-07-12 HISTORY — DX: Gout, unspecified: M10.9

## 2014-07-12 HISTORY — DX: Cerebral infarction, unspecified: I63.9

## 2014-07-12 MED ORDER — METHYLPREDNISOLONE SODIUM SUCC 125 MG IJ SOLR
125.0000 mg | Freq: Once | INTRAMUSCULAR | Status: AC
  Administered 2014-07-12: 125 mg via INTRAMUSCULAR
  Filled 2014-07-12: qty 2

## 2014-07-12 MED ORDER — PREDNISONE 10 MG PO TABS: 20.0000 mg | ORAL_TABLET | Freq: Every day | ORAL | Status: DC

## 2014-07-12 NOTE — Discharge Instructions (Signed)
Take benadryl for itching and follow up with your md next week for recheck

## 2014-07-12 NOTE — ED Notes (Signed)
Rash for 2 weeks, itches, Present to face and arms

## 2014-07-12 NOTE — ED Provider Notes (Signed)
CSN: [id number]     Arrival date & time 07/12/14  1248 History   First MD Initiated Contact with Patient 07/12/14 1323     No chief complaint on file.    (Consider location/radiation/quality/duration/timing/severity/associated sxs/prior Treatment) Patient is a 81 y.o. female presenting with rash. The history is provided by the patient (the pt complains of a puritic rash to her face and arms).  Rash Location: face and arms. Severity:  Mild Onset quality:  Sudden Timing:  Constant Progression:  Unchanged Chronicity:  New Context: not animal contact   Relieved by:  Nothing Associated symptoms: no abdominal pain, no diarrhea, no fatigue and no headaches     Past Medical History  Diagnosis Date  . Hypertension   . Arthritis   . Gout spondylitis   . H/O: CVA (cardiovascular accident) 02/19/2011    Cerebellar distribution  . Dizziness   . Diabetes mellitus type II 02/19/2011  . Sciatica   . Stroke   . Gout    Past Surgical History  Procedure Laterality Date  . Abdominal hysterectomy     History reviewed. No pertinent family history. History  Substance Use Topics  . Smoking status: Never Smoker   . Smokeless tobacco: Never Used  . Alcohol Use: No   OB History    No data available     Review of Systems  Constitutional: Negative for appetite change and fatigue.  HENT: Negative for congestion, ear discharge and sinus pressure.   Eyes: Negative for discharge.  Respiratory: Negative for cough.   Cardiovascular: Negative for chest pain.  Gastrointestinal: Negative for abdominal pain and diarrhea.  Genitourinary: Negative for frequency and hematuria.  Musculoskeletal: Negative for back pain.  Skin: Positive for rash.       puritic rash  Neurological: Negative for seizures and headaches.  Psychiatric/Behavioral: Negative for hallucinations.      Allergies  Bee venom and Penicillins  Home Medications   Prior to Admission medications   Medication Sig Start Date  End Date Taking? Authorizing Provider  allopurinol (ZYLOPRIM) 100 MG tablet Take 300 mg by mouth at bedtime.     Yes Historical Provider, MD  aspirin EC 81 MG tablet Take 81 mg by mouth daily.   Yes Historical Provider, MD  clopidogrel (PLAVIX) 75 MG tablet  06/08/14  Yes Historical Provider, MD  colchicine 0.6 MG tablet Take 0.6 mg by mouth daily as needed. gout    Yes Historical Provider, MD  famotidine (PEPCID) 20 MG tablet  07/06/14  Yes Historical Provider, MD  meclizine (ANTIVERT) 25 MG tablet Take 25 mg by mouth 3 (three) times daily as needed. For nausea   Yes Historical Provider, MD  tolterodine (DETROL LA) 2 MG 24 hr capsule Take 2 mg by mouth daily.     Yes Historical Provider, MD  torsemide (DEMADEX) 10 MG tablet  06/28/14  Yes Historical Provider, MD  predniSONE (DELTASONE) 10 MG tablet Take 2 tablets (20 mg total) by mouth daily. 07/12/14   Bethann Berkshire, MD   BP 125/43 mmHg  Pulse 84  Temp(Src) 99.3 F (37.4 C) (Oral)  Resp 20  Ht 5' (1.524 m)  Wt 140 lb (63.504 kg)  BMI 27.34 kg/m2  SpO2 100% Physical Exam  Constitutional: She is oriented to person, place, and time. She appears well-developed.  HENT:  Head: Normocephalic.  Eyes: Conjunctivae and EOM are normal. No scleral icterus.  Neck: Neck supple. No thyromegaly present.  Cardiovascular: Normal rate and regular rhythm.  Exam reveals no gallop  and no friction rub.   No murmur heard. Pulmonary/Chest: No stridor. She has no wheezes. She has no rales. She exhibits no tenderness.  Abdominal: She exhibits no distension. There is no tenderness. There is no rebound.  Musculoskeletal: Normal range of motion. She exhibits no edema.  Lymphadenopathy:    She has no cervical adenopathy.  Neurological: She is oriented to person, place, and time. She exhibits normal muscle tone. Coordination normal.  Skin: Rash noted. There is erythema.  Allergic appearing rash to arms and face  Psychiatric: She has a normal mood and affect. Her  behavior is normal.    ED Course  Procedures (including critical care time) Labs Review Labs Reviewed - No data to display  Imaging Review No results found.   EKG Interpretation None      MDM   Final diagnoses:  Rash    tx with prednisone and benadryl and follow up with pcp    Bethann BerkshireJoseph Dorethia Jeanmarie, MD 07/12/14 1445

## 2014-10-19 ENCOUNTER — Emergency Department (HOSPITAL_COMMUNITY): Payer: Medicare Other

## 2014-10-19 ENCOUNTER — Encounter (HOSPITAL_COMMUNITY): Payer: Self-pay | Admitting: Emergency Medicine

## 2014-10-19 ENCOUNTER — Emergency Department (HOSPITAL_COMMUNITY)
Admission: EM | Admit: 2014-10-19 | Discharge: 2014-10-19 | Disposition: A | Discharge status: Home / Self Care | Payer: Medicare Other | Attending: Emergency Medicine | Admitting: Emergency Medicine

## 2014-10-19 DIAGNOSIS — Y998 Other external cause status: Secondary | ICD-10-CM | POA: Insufficient documentation

## 2014-10-19 DIAGNOSIS — E119 Type 2 diabetes mellitus without complications: Secondary | ICD-10-CM | POA: Insufficient documentation

## 2014-10-19 DIAGNOSIS — Y92512 Supermarket, store or market as the place of occurrence of the external cause: Secondary | ICD-10-CM | POA: Diagnosis not present

## 2014-10-19 DIAGNOSIS — I10 Essential (primary) hypertension: Secondary | ICD-10-CM | POA: Insufficient documentation

## 2014-10-19 DIAGNOSIS — S99912A Unspecified injury of left ankle, initial encounter: Secondary | ICD-10-CM | POA: Diagnosis not present

## 2014-10-19 DIAGNOSIS — M1990 Unspecified osteoarthritis, unspecified site: Secondary | ICD-10-CM | POA: Diagnosis not present

## 2014-10-19 DIAGNOSIS — Z7982 Long term (current) use of aspirin: Secondary | ICD-10-CM | POA: Diagnosis not present

## 2014-10-19 DIAGNOSIS — Y9389 Activity, other specified: Secondary | ICD-10-CM | POA: Diagnosis not present

## 2014-10-19 DIAGNOSIS — Z79899 Other long term (current) drug therapy: Secondary | ICD-10-CM | POA: Insufficient documentation

## 2014-10-19 DIAGNOSIS — S99922A Unspecified injury of left foot, initial encounter: Secondary | ICD-10-CM | POA: Diagnosis present

## 2014-10-19 DIAGNOSIS — M1009 Idiopathic gout, multiple sites: Secondary | ICD-10-CM | POA: Diagnosis not present

## 2014-10-19 DIAGNOSIS — Z8673 Personal history of transient ischemic attack (TIA), and cerebral infarction without residual deficits: Secondary | ICD-10-CM | POA: Insufficient documentation

## 2014-10-19 DIAGNOSIS — Z880 Allergy status to penicillin: Secondary | ICD-10-CM | POA: Insufficient documentation

## 2014-10-19 DIAGNOSIS — W1839XA Other fall on same level, initial encounter: Secondary | ICD-10-CM | POA: Insufficient documentation

## 2014-10-19 DIAGNOSIS — Z7902 Long term (current) use of antithrombotics/antiplatelets: Secondary | ICD-10-CM | POA: Diagnosis not present

## 2014-10-19 DIAGNOSIS — M7989 Other specified soft tissue disorders: Secondary | ICD-10-CM

## 2014-10-19 NOTE — ED Notes (Signed)
Patient given discharge instruction, verbalized understand. Patient ambulatory out of the department.  

## 2014-10-19 NOTE — ED Notes (Addendum)
Pt states she fell at the grocery store 2 weeks ago. Pt c/o of LT foot and ankle pain and swelling that has become increasingly worse. Moderate swelling noted to pt's LT ankle and dorsal foot. Pt able to move extremity, but states it is painful. Denies LOC. Denies CP or SOB.

## 2014-10-19 NOTE — Discharge Instructions (Signed)

## 2014-10-19 NOTE — ED Provider Notes (Signed)
CSN: [id number]     Arrival date & time 10/19/14  1234 History  This chart was scribed for No att. providers found by Marica Otter, ED Scribe. This patient was seen in room [facility] and the patient's care was started at 12:55 PM.   Chief Complaint  Patient presents with  . Foot Injury   The history is provided by the patient. No language interpreter was used.   PCP: Reynolds Bowl, MD HPI Comments: [name] [name] is a 81 y.o. female, with PMHx noted below, who presents to the Emergency Department complaining of traumatic, worsening, sudden onset, 5/10 left foot and ankle pain with associated swelling onset 2 weeks ago after pt almost fell. Pt denies LOC or head trauma resulting form the near fall. Pt further denies fever, chills, knee pain, or any other pain.   Past Medical History  Diagnosis Date  . Hypertension   . Arthritis   . Gout spondylitis   . H/O: CVA (cardiovascular accident) 02/19/2011    Cerebellar distribution  . Dizziness   . Diabetes mellitus type II 02/19/2011  . Sciatica   . Stroke   . Gout    Past Surgical History  Procedure Laterality Date  . Abdominal hysterectomy     No family history on file. Social History  Substance Use Topics  . Smoking status: Never Smoker   . Smokeless tobacco: Never Used  . Alcohol Use: No   OB History    No data available     Review of Systems  Constitutional: Negative for fever and chills.  Musculoskeletal: Positive for joint swelling (left ankle) and arthralgias (left ankle and foot pain with associated swelling).  Psychiatric/Behavioral: Negative for confusion.      Allergies  Bee venom and Penicillins  Home Medications   Prior to Admission medications   Medication Sig Start Date End Date Taking? Authorizing Provider  allopurinol (ZYLOPRIM) 100 MG tablet Take 300 mg by mouth at bedtime.     Yes Historical Provider, MD  aspirin EC 81 MG tablet Take 81 mg by mouth daily.   Yes Historical Provider, MD   clopidogrel (PLAVIX) 75 MG tablet Take 75 mg by mouth daily.  06/08/14  Yes Historical Provider, MD  colchicine 0.6 MG tablet Take 0.6 mg by mouth daily as needed. gout    Yes Historical Provider, MD  famotidine (PEPCID) 20 MG tablet Take 20 mg by mouth daily.  07/06/14  Yes Historical Provider, MD  loratadine (CLARITIN) 10 MG tablet Take 10 mg by mouth daily.   Yes Historical Provider, MD  meclizine (ANTIVERT) 25 MG tablet Take 25 mg by mouth 3 (three) times daily as needed. For nausea   Yes Historical Provider, MD  tolterodine (DETROL LA) 2 MG 24 hr capsule Take 2 mg by mouth daily.     Yes Historical Provider, MD  torsemide (DEMADEX) 10 MG tablet Take 10 mg by mouth daily.  06/28/14  Yes Historical Provider, MD  predniSONE (DELTASONE) 10 MG tablet Take 2 tablets (20 mg total) by mouth daily. Patient not taking: Reported on 10/19/2014 07/12/14   Bethann Berkshire, MD   Triage Vitals: BP 140/91 mmHg  Pulse 88  Temp(Src) 97.9 F (36.6 C) (Oral)  Resp 18  Ht 5' 2.5" (1.588 m)  Wt 140 lb (63.504 kg)  BMI 25.18 kg/m2  SpO2 97% Physical Exam  Constitutional: She is oriented to person, place, and time. She appears well-developed and well-nourished. No distress.  HENT:  Head: Normocephalic and atraumatic.  Eyes:  EOM are normal.  Neck: Normal range of motion.  Cardiovascular: Normal rate, regular rhythm and normal heart sounds.   Pulmonary/Chest: Effort normal and breath sounds normal.  Abdominal: Soft. She exhibits no distension. There is no tenderness.  Musculoskeletal: Normal range of motion. She exhibits edema and tenderness.       Left ankle: She exhibits swelling (pitting edema ). Tenderness.       Left lower leg: She exhibits swelling (pitting edema).       Left foot: There is tenderness (dorsum of foot, pulses difficult to get due to edema. ) and swelling (pitting edema).  Warm without erythema. Some difficulty with dorsal flexion. Neurovascularly intact.   Neurological: She is alert and  oriented to person, place, and time.  Skin: Skin is warm and dry.  Psychiatric: She has a normal mood and affect. Judgment normal.  Nursing note and vitals reviewed.   ED Course  Procedures (including critical care time) DIAGNOSTIC STUDIES: Oxygen Saturation is 97% on RA, nl by my interpretation.    COORDINATION OF CARE: 12:57 PM: Discussed treatment plan which includes imaging and possible ultrasound with pt at bedside; patient verbalizes understanding and agrees with treatment plan.  Labs Review Labs Reviewed - No data to display  Imaging Review Dg Ankle Complete Left  10/19/2014   CLINICAL DATA:  Lateral side foot pain.  EXAM: LEFT ANKLE COMPLETE - 3+ VIEW  COMPARISON:  None.  FINDINGS: There is a subtle curvilinear opacity through the proximal most head of the fifth metatarsal bone which likely represents a nondisplaced fracture. There is baseline osteopenia. Dorsal soft tissue swelling is noted. Vascular calcifications is seen. There is significant edema about the ankle.  IMPRESSION: Probable nondisplaced fifth metatarsal head fracture.  Significant soft tissue edema.   Electronically Signed   By: Ted Mcalpine M.D.   On: 10/19/2014 13:32   US Venous Img Lower Unilateral Left  10/19/2014   CLINICAL DATA:  Left lower extremity pain and edema.  EXAM: LEFT LOWER EXTREMITY VENOUS DOPPLER ULTRASOUND  TECHNIQUE: Gray-scale sonography with graded compression, as well as color Doppler and duplex ultrasound were performed to evaluate the lower extremity deep venous systems from the level of the common femoral vein and including the common femoral, femoral, profunda femoral, popliteal and calf veins including the posterior tibial, peroneal and gastrocnemius veins when visible. The superficial great saphenous vein was also interrogated. Spectral Doppler was utilized to evaluate flow at rest and with distal augmentation maneuvers in the common femoral, femoral and popliteal veins.  COMPARISON:   None.  FINDINGS: Contralateral Common Femoral Vein: Respiratory phasicity is normal and symmetric with the symptomatic side. No evidence of thrombus. Normal compressibility.  Common Femoral Vein: No evidence of thrombus. Normal compressibility, respiratory phasicity and response to augmentation.  Saphenofemoral Junction: No evidence of thrombus. Normal compressibility and flow on color Doppler imaging.  Profunda Femoral Vein: No evidence of thrombus. Normal compressibility and flow on color Doppler imaging.  Femoral Vein: No evidence of thrombus. Normal compressibility, respiratory phasicity and response to augmentation.  Popliteal Vein: No evidence of thrombus. Normal compressibility, respiratory phasicity and response to augmentation.  Calf Veins: No evidence of thrombus. Normal compressibility and flow on color Doppler imaging.  Superficial Great Saphenous Vein: No evidence of thrombus. Normal compressibility and flow on color Doppler imaging.  Venous Reflux:  None.  Other Findings: No evidence of superficial thrombophlebitis or abnormal fluid collection.  IMPRESSION: No evidence of left lower extremity deep venous thrombosis.   Electronically  Signed   By: Irish Lack M.D.   On: 10/19/2014 14:49   Dg Foot Complete Left  10/19/2014   CLINICAL DATA:  81 year old female with left lateral foot pain and swelling and ankle pain and swelling.  EXAM: LEFT FOOT - COMPLETE 3+ VIEW  COMPARISON:  No priors.  FINDINGS: Bones are osteopenic. No definite acute displaced fracture or dislocation. Degenerative changes are noted from osteoarthritis, most pronounced in the midfoot and hindfoot. Diffuse soft tissue swelling, particularly over the dorsal aspect of the metatarsals and midfoot.  IMPRESSION: 1. No acute osseous abnormality. 2. Diffuse soft tissue swelling, most pronounced in the dorsal aspect of the midfoot and over the dorsal metatarsals. 3. Degenerative changes of osteoarthritis.   Electronically Signed   By:  Trudie Reed M.D.   On: 10/19/2014 13:30   I have personally reviewed and evaluated these images and lab results as part of my medical decision-making.   EKG Interpretation None      MDM   Final diagnoses:  Foot swelling    Patient was swelling in her foot. Differing x-ray opinions but clinically no fracture over proximal fifth metatarsal. Negative Doppler also. Will discharge home.  I personally performed the services described in this documentation, which was scribed in my presence. The recorded information has been reviewed and is accurate.     Benjiman Core, MD 10/20/14 [phone]

## 2017-02-14 ENCOUNTER — Emergency Department (HOSPITAL_COMMUNITY): Payer: Medicare Other

## 2017-02-14 ENCOUNTER — Other Ambulatory Visit: Payer: Self-pay

## 2017-02-14 ENCOUNTER — Encounter (HOSPITAL_COMMUNITY): Payer: Self-pay | Admitting: Emergency Medicine

## 2017-02-14 ENCOUNTER — Inpatient Hospital Stay (HOSPITAL_COMMUNITY)
Admission: EM | Admit: 2017-02-14 | Discharge: 2017-02-18 | DRG: 470 | Disposition: A | Discharge status: Rehabilitation Facility | Payer: Medicare Other | Attending: Internal Medicine | Admitting: Internal Medicine

## 2017-02-14 DIAGNOSIS — Z880 Allergy status to penicillin: Secondary | ICD-10-CM

## 2017-02-14 DIAGNOSIS — R739 Hyperglycemia, unspecified: Secondary | ICD-10-CM

## 2017-02-14 DIAGNOSIS — D649 Anemia, unspecified: Secondary | ICD-10-CM | POA: Diagnosis present

## 2017-02-14 DIAGNOSIS — S72001A Fracture of unspecified part of neck of right femur, initial encounter for closed fracture: Secondary | ICD-10-CM | POA: Diagnosis present

## 2017-02-14 DIAGNOSIS — Z96641 Presence of right artificial hip joint: Secondary | ICD-10-CM

## 2017-02-14 DIAGNOSIS — W1830XA Fall on same level, unspecified, initial encounter: Secondary | ICD-10-CM | POA: Diagnosis present

## 2017-02-14 DIAGNOSIS — T380X5A Adverse effect of glucocorticoids and synthetic analogues, initial encounter: Secondary | ICD-10-CM | POA: Diagnosis present

## 2017-02-14 DIAGNOSIS — S72011A Unspecified intracapsular fracture of right femur, initial encounter for closed fracture: Secondary | ICD-10-CM | POA: Diagnosis not present

## 2017-02-14 DIAGNOSIS — J9811 Atelectasis: Secondary | ICD-10-CM | POA: Diagnosis present

## 2017-02-14 DIAGNOSIS — Z79899 Other long term (current) drug therapy: Secondary | ICD-10-CM

## 2017-02-14 DIAGNOSIS — E119 Type 2 diabetes mellitus without complications: Secondary | ICD-10-CM

## 2017-02-14 DIAGNOSIS — Z7902 Long term (current) use of antithrombotics/antiplatelets: Secondary | ICD-10-CM

## 2017-02-14 DIAGNOSIS — I10 Essential (primary) hypertension: Secondary | ICD-10-CM | POA: Diagnosis present

## 2017-02-14 DIAGNOSIS — W19XXXA Unspecified fall, initial encounter: Secondary | ICD-10-CM

## 2017-02-14 DIAGNOSIS — R000 Tachycardia, unspecified: Secondary | ICD-10-CM

## 2017-02-14 DIAGNOSIS — Z90710 Acquired absence of both cervix and uterus: Secondary | ICD-10-CM

## 2017-02-14 DIAGNOSIS — Z91030 Bee allergy status: Secondary | ICD-10-CM

## 2017-02-14 DIAGNOSIS — K219 Gastro-esophageal reflux disease without esophagitis: Secondary | ICD-10-CM | POA: Diagnosis present

## 2017-02-14 DIAGNOSIS — E1165 Type 2 diabetes mellitus with hyperglycemia: Secondary | ICD-10-CM | POA: Diagnosis present

## 2017-02-14 DIAGNOSIS — G8918 Other acute postprocedural pain: Secondary | ICD-10-CM

## 2017-02-14 DIAGNOSIS — D696 Thrombocytopenia, unspecified: Secondary | ICD-10-CM

## 2017-02-14 DIAGNOSIS — Z7982 Long term (current) use of aspirin: Secondary | ICD-10-CM

## 2017-02-14 DIAGNOSIS — E861 Hypovolemia: Secondary | ICD-10-CM | POA: Diagnosis present

## 2017-02-14 DIAGNOSIS — Z419 Encounter for procedure for purposes other than remedying health state, unspecified: Secondary | ICD-10-CM

## 2017-02-14 DIAGNOSIS — Z8673 Personal history of transient ischemic attack (TIA), and cerebral infarction without residual deficits: Secondary | ICD-10-CM

## 2017-02-14 DIAGNOSIS — D62 Acute posthemorrhagic anemia: Secondary | ICD-10-CM

## 2017-02-14 DIAGNOSIS — N179 Acute kidney failure, unspecified: Secondary | ICD-10-CM | POA: Diagnosis not present

## 2017-02-14 DIAGNOSIS — M25561 Pain in right knee: Secondary | ICD-10-CM | POA: Diagnosis not present

## 2017-02-14 NOTE — ED Triage Notes (Signed)
Patient had earlier today, refused transport, later in day right knee swelling and painful, patient request transport to ED.

## 2017-02-14 NOTE — ED Provider Notes (Signed)
[facility] EMERGENCY DEPARTMENT Provider Note   CSN: [id number] Arrival date & time: 02/14/17  2144     History   Chief Complaint Chief Complaint  Patient presents with  . Fall    HPI [name] [name] is a 83 y.o. female.  Patient sustained a fall around 3 PM between her chair in her bed she landed on her right knee.  She denies any dizzy spell or passing out.  She denies hitting her head.  She has increased pain and swelling to her right knee throughout the day and EMS was called.  She denies any issues with this knee in the past.  She has not been able to bear weight.  She denies any head, neck or back pain.  No chest pain or shortness of breath.  No abdominal pain nausea or vomiting.  She does take aspirin and Plavix.  She denies any numbness or tingling.   The history is provided by the patient.    Past Medical History:  Diagnosis Date  . Arthritis   . Diabetes mellitus type II 02/19/2011  . Dizziness   . Gout   . Gout spondylitis   . 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 Hypertension   . Sciatica   . Stroke [facility])     Patient Active Problem List   Diagnosis Date Noted  . Nausea and vomiting 03/09/2011  . Diabetes mellitus, type II (HCC) 03/09/2011  . Hyperkalemia 03/09/2011  . Acute renal failure (HCC) 03/09/2011  . Hypotension 03/09/2011  . Anemia of chronic disease 03/09/2011  . Coagulopathy (HCC) 03/09/2011  . HTN (hypertension) 02/19/2011  . Dizziness 02/19/2011  . Cerebellar infarct (HCC) 02/19/2011  . Bradycardia 02/19/2011  . SPONDYLOSIS 09/18/2009  . SCIATICA 09/18/2009  . SCOLIOSIS-IDIOPATHIC 09/18/2009  . SPONDYLOLITHESIS 09/18/2009    Past Surgical History:  Procedure Laterality Date  . ABDOMINAL HYSTERECTOMY      OB History    No data available       Home Medications    Prior to Admission medications   Medication Sig Start Date End Date Taking? Authorizing Provider  allopurinol (ZYLOPRIM) 100  MG tablet Take 300 mg by mouth at bedtime.     Yes [provider]  aspirin EC 81 MG tablet Take 81 mg by mouth daily.   Yes [provider]  atorvastatin (LIPITOR) 20 MG tablet Take 1 tablet by mouth daily. 12/22/16  Yes [provider]  clopidogrel (PLAVIX) 75 MG tablet Take 75 mg by mouth daily.  06/08/14  Yes [provider]  Coenzyme Q10 (COQ10 PO) Take 100 mg by mouth daily.   Yes [provider]  colchicine 0.6 MG tablet Take 0.6 mg by mouth daily as needed. gout    Yes [provider]  famotidine (PEPCID) 20 MG tablet Take 20 mg by mouth daily.  07/06/14  Yes [provider]  loratadine (CLARITIN) 10 MG tablet Take 10 mg by mouth daily.   Yes [provider]  meclizine (ANTIVERT) 25 MG tablet Take 25 mg by mouth 3 (three) times daily as needed. For nausea   Yes [provider]  tolterodine (DETROL LA) 2 MG 24 hr capsule Take 4 mg by mouth daily.    Yes [provider]  torsemide (DEMADEX) 10 MG tablet Take 10 mg by mouth daily.  06/28/14  Yes [provider]    Family History History reviewed. No pertinent family history.  Social History Social History  Tobacco Use  . Smoking status: Never Smoker  . Smokeless tobacco: Never Used  Substance Use Topics  . Alcohol use: No  . Drug use: No     Allergies   Bee venom and Penicillins   Review of Systems Review of Systems  Constitutional: Negative for activity change, appetite change and fever.  HENT: Negative for congestion, rhinorrhea and tinnitus.   Respiratory: Negative for cough, chest tightness and shortness of breath.   Cardiovascular: Negative for chest pain and leg swelling.  Gastrointestinal: Negative for abdominal pain, nausea and vomiting.  Genitourinary: Negative for dysuria, vaginal bleeding and vaginal discharge.  Musculoskeletal: Positive for arthralgias and myalgias. Negative for back pain and neck pain.  Skin:  Negative for rash.  Neurological: Negative for dizziness, weakness and headaches.   all other systems are negative except as noted in the HPI and PMH.     Physical Exam Updated Vital Signs BP 138/62 (BP Location: Right Arm)   Pulse (!) 59   Temp 97.6 F (36.4 C) (Oral)   Resp 16   Ht 4\' 10"  (1.473 m)   Wt 58.1 kg (128 lb)   SpO2 98%   BMI 26.75 kg/m   Physical Exam  Constitutional: She is oriented to person, place, and time. She appears well-developed and well-nourished. No distress.  HENT:  Head: Normocephalic and atraumatic.  Mouth/Throat: Oropharynx is clear and moist. No oropharyngeal exudate.  Eyes: Conjunctivae and EOM are normal. Pupils are equal, round, and reactive to light.  Neck: Normal range of motion. Neck supple.  No C spine tenderness  Cardiovascular: Normal rate, regular rhythm, normal heart sounds and intact distal pulses.  No murmur heard. Pulmonary/Chest: Effort normal and breath sounds normal. No respiratory distress. She exhibits no tenderness.  Abdominal: Soft. There is no tenderness. There is no rebound and no guarding.  Musculoskeletal: She exhibits edema and tenderness.  Diffuse edema and tenderness to palpation of the right knee.  Range of motion limited due to pain.  Right leg appears shortened and externally rotated.  Intact PT pulse  Pain with attempted range of motion of right hip and knee.  Neurological: She is alert and oriented to person, place, and time. No cranial nerve deficit. She exhibits normal muscle tone. Coordination normal.  No ataxia on finger to nose bilaterally. No pronator drift. 5/5 strength throughout. CN 2-12 intact.Equal grip strength. Sensation intact.   Skin: Skin is warm.  Psychiatric: She has a normal mood and affect. Her behavior is normal.  Nursing note and vitals reviewed.    ED Treatments / Results  Labs (all labs ordered are listed, but only abnormal results are displayed) Labs Reviewed  CBC WITH  DIFFERENTIAL/PLATELET - Abnormal; Notable for the following components:      Result Value   RBC 3.61 (*)    Hemoglobin 10.2 (*)    HCT 32.4 (*)    RDW 16.1 (*)    All other components within normal limits  BASIC METABOLIC PANEL - Abnormal; Notable for the following components:   Glucose, Bld 152 (*)    BUN 22 (*)    Creatinine, Ser 1.21 (*)    GFR calc non Af Amer 36 (*)    GFR calc Af Amer 42 (*)    All other components within normal limits    EKG  EKG Interpretation  Date/Time:  Sunday February 14 2017 22:05:41 EST Ventricular Rate:  83 PR Interval:    QRS Duration: 124 QT Interval:  393 QTC Calculation: 462  R Axis:   -57 Text Interpretation:  Sinus rhythm Prolonged PR interval Left bundle branch block No significant change was found Confirmed by Glynn Octaveancour, Weyman Bogdon [phone]([id number]) on 02/14/2017 11:08:27 PM       Radiology Dg Chest 2 View  Result Date: 02/15/2017 CLINICAL DATA:  Right leg pain after a fall today. History of diabetes, hypertension, stroke EXAM: CHEST  2 VIEW COMPARISON:  03/09/2011 FINDINGS: Shallow inspiration. Heart size and pulmonary vascularity are normal for technique. Suggestion of infiltration or atelectasis in the left lung base behind the heart. No blunting of costophrenic angles. No pneumothorax. Calcification of the aorta. Degenerative changes in the spine and shoulders. Mediastinal contours appear intact. IMPRESSION: Infiltration or atelectasis in the left lung base behind the heart. Aortic atherosclerosis. Electronically Signed   By: Burman NievesWilliam  Stevens M.D.   On: 02/15/2017 00:09   Dg Knee Complete 4 Views Right  Result Date: 02/14/2017 CLINICAL DATA:  Acute onset of right knee pain and swelling. EXAM: RIGHT KNEE - COMPLETE 4+ VIEW COMPARISON:  None. FINDINGS: There is no evidence of fracture or dislocation. There is significant narrowing at the lateral compartment, with associated sclerosis. Cortical irregularity is noted at all 3 compartments. Prominent  marginal osteophytes are seen at all 3 compartments, and along the tibial spine. Wall osteophytes are also seen. There is chronic flattening of the patella. Trace knee joint fluid remains within normal limits. Mild edema is noted at the level of the patellar tendon. IMPRESSION: 1. No evidence of fracture or dislocation. 2. Prominent tricompartmental osteoarthritis, most significant at the lateral compartment. Electronically Signed   By: Roanna RaiderJeffery  Chang M.D.   On: 02/14/2017 22:56   Dg Hip Unilat W Or Wo Pelvis 2-3 Views Right  Result Date: 02/15/2017 CLINICAL DATA:  Right leg pain after fall today. History of diabetes, hypertension, stroke. EXAM: DG HIP (WITH OR WITHOUT PELVIS) 2-3V RIGHT COMPARISON:  None. FINDINGS: Diffuse bone demineralization. Degenerative changes in the lower lumbar spine and hips. Focal cortical irregularity and slight lucency along the subcapital right femoral neck consistent with a mildly impacted fracture. No displacement or dislocation. Pelvis appears intact. SI joints and symphysis pubis are not displaced. Calcification adjacent to the pubic symphysis consistent with osteitis pubis. Vascular calcifications. IMPRESSION: Probable impacted transverse subcapital fracture of the right proximal femur. No displacement or dislocation. Electronically Signed   By: Burman NievesWilliam  Stevens M.D.   On: 02/15/2017 00:06   Dg Femur Min 2 Views Right  Result Date: 02/15/2017 CLINICAL DATA:  Right leg pain after a fall today. EXAM: RIGHT FEMUR 2 VIEWS COMPARISON:  None. FINDINGS: Diffuse bone demineralization. Cortical irregularity with vague linear lucency across a subcapital femoral neck consistent with nondisplaced impacted fracture. Femoral shaft appears intact. Severe degenerative changes in the right knee with lateral greater than medial compartment narrowing, osteophyte formation, and cartilaginous calcification. No significant effusion. Vascular calcifications in the soft tissues. IMPRESSION:  Probable impacted subcapital fracture of the proximal right femur. Degenerative changes in the knee joint. No additional fractures identified. Electronically Signed   By: Burman NievesWilliam  Stevens M.D.   On: 02/15/2017 00:08    Procedures Procedures (including critical care time)  Medications Ordered in ED Medications - No data to display   Initial Impression / Assessment and Plan / ED Course  I have reviewed the triage vital signs and the nursing notes.  Pertinent labs & imaging results that were available during my care of the patient were reviewed by me and considered in my medical decision making (see chart  for details).    Patient with mechanical fall complaining of right knee pain.  On exam her right leg appears to be shortened and externally rotated.  Concern for occult hip fracture.  Patient with no head or neck injury.  Concern for possible hip fracture.  She is neurovascularly intact.  Knee x-rays negative but there is a subcapital right femoral neck fracture.  Discussed with Dr. Eulah Pont of orthopedics.  He will see patient tomorrow at [facility].  Preop labs ordered and chest x-ray ordered.  EKG is sinus rhythm. Admission to [facility] discussed with Dr. Robb Matar.  Angiocath insertion Performed by: Glynn Octave  Consent: Verbal consent obtained. Risks and benefits: risks, benefits and alternatives were discussed Time out: Immediately prior to procedure a "time out" was called to verify the correct patient, procedure, equipment, support staff and site/side marked as required.  Preparation: Patient was prepped and draped in the usual sterile fashion.  Vein Location: L EJ  Not Ultrasound Guided  Gauge: 20  Normal blood return and flush without difficulty Patient tolerance: Patient tolerated the procedure well with no immediate complications.    Final Clinical Impressions(s) / ED Diagnoses   Final diagnoses:  Subcapital fracture of right hip, closed, initial  encounter [facility])  Fall, initial encounter    ED Discharge Orders    None       Layton Naves, Jeannett Senior, MD 02/15/17 [phone]

## 2017-02-14 NOTE — ED Notes (Signed)
Pt to xray

## 2017-02-15 ENCOUNTER — Encounter (HOSPITAL_COMMUNITY): Payer: Self-pay | Admitting: Internal Medicine

## 2017-02-15 ENCOUNTER — Inpatient Hospital Stay (HOSPITAL_COMMUNITY): Payer: Medicare Other | Admitting: Certified Registered"

## 2017-02-15 ENCOUNTER — Encounter (HOSPITAL_COMMUNITY)
Admission: EM | Disposition: A | Discharge status: Rehabilitation Facility | Payer: Self-pay | Source: Home / Self Care | Attending: Family Medicine

## 2017-02-15 ENCOUNTER — Inpatient Hospital Stay (HOSPITAL_COMMUNITY): Payer: Medicare Other

## 2017-02-15 DIAGNOSIS — Z91030 Bee allergy status: Secondary | ICD-10-CM | POA: Diagnosis not present

## 2017-02-15 DIAGNOSIS — S72001D Fracture of unspecified part of neck of right femur, subsequent encounter for closed fracture with routine healing: Secondary | ICD-10-CM | POA: Diagnosis not present

## 2017-02-15 DIAGNOSIS — N179 Acute kidney failure, unspecified: Secondary | ICD-10-CM | POA: Diagnosis not present

## 2017-02-15 DIAGNOSIS — Z7902 Long term (current) use of antithrombotics/antiplatelets: Secondary | ICD-10-CM | POA: Diagnosis not present

## 2017-02-15 DIAGNOSIS — J9811 Atelectasis: Secondary | ICD-10-CM | POA: Diagnosis present

## 2017-02-15 DIAGNOSIS — E119 Type 2 diabetes mellitus without complications: Secondary | ICD-10-CM | POA: Diagnosis present

## 2017-02-15 DIAGNOSIS — W1830XA Fall on same level, unspecified, initial encounter: Secondary | ICD-10-CM | POA: Diagnosis not present

## 2017-02-15 DIAGNOSIS — Z8673 Personal history of transient ischemic attack (TIA), and cerebral infarction without residual deficits: Secondary | ICD-10-CM

## 2017-02-15 DIAGNOSIS — K219 Gastro-esophageal reflux disease without esophagitis: Secondary | ICD-10-CM | POA: Diagnosis present

## 2017-02-15 DIAGNOSIS — W19XXXA Unspecified fall, initial encounter: Secondary | ICD-10-CM

## 2017-02-15 DIAGNOSIS — S72001A Fracture of unspecified part of neck of right femur, initial encounter for closed fracture: Secondary | ICD-10-CM | POA: Diagnosis not present

## 2017-02-15 DIAGNOSIS — Z90710 Acquired absence of both cervix and uterus: Secondary | ICD-10-CM | POA: Diagnosis not present

## 2017-02-15 DIAGNOSIS — D696 Thrombocytopenia, unspecified: Secondary | ICD-10-CM | POA: Diagnosis present

## 2017-02-15 DIAGNOSIS — I10 Essential (primary) hypertension: Secondary | ICD-10-CM | POA: Diagnosis present

## 2017-02-15 DIAGNOSIS — S72011A Unspecified intracapsular fracture of right femur, initial encounter for closed fracture: Secondary | ICD-10-CM | POA: Diagnosis present

## 2017-02-15 DIAGNOSIS — M25561 Pain in right knee: Secondary | ICD-10-CM | POA: Diagnosis present

## 2017-02-15 DIAGNOSIS — Z7982 Long term (current) use of aspirin: Secondary | ICD-10-CM | POA: Diagnosis not present

## 2017-02-15 DIAGNOSIS — D62 Acute posthemorrhagic anemia: Secondary | ICD-10-CM | POA: Diagnosis not present

## 2017-02-15 DIAGNOSIS — Z79899 Other long term (current) drug therapy: Secondary | ICD-10-CM | POA: Diagnosis not present

## 2017-02-15 DIAGNOSIS — E1165 Type 2 diabetes mellitus with hyperglycemia: Secondary | ICD-10-CM | POA: Diagnosis present

## 2017-02-15 DIAGNOSIS — F05 Delirium due to known physiological condition: Secondary | ICD-10-CM | POA: Diagnosis not present

## 2017-02-15 DIAGNOSIS — R2689 Other abnormalities of gait and mobility: Secondary | ICD-10-CM | POA: Diagnosis not present

## 2017-02-15 DIAGNOSIS — E861 Hypovolemia: Secondary | ICD-10-CM | POA: Diagnosis present

## 2017-02-15 DIAGNOSIS — Z880 Allergy status to penicillin: Secondary | ICD-10-CM | POA: Diagnosis not present

## 2017-02-15 DIAGNOSIS — K5901 Slow transit constipation: Secondary | ICD-10-CM | POA: Diagnosis not present

## 2017-02-15 DIAGNOSIS — N183 Chronic kidney disease, stage 3 (moderate): Secondary | ICD-10-CM | POA: Diagnosis not present

## 2017-02-15 DIAGNOSIS — T380X5A Adverse effect of glucocorticoids and synthetic analogues, initial encounter: Secondary | ICD-10-CM | POA: Diagnosis present

## 2017-02-15 HISTORY — PX: HIP PINNING,CANNULATED: SHX1758

## 2017-02-15 LAB — BASIC METABOLIC PANEL
ANION GAP: 12 (ref 5–15)
BUN: 22 mg/dL — AB (ref 6–20)
CALCIUM: 9 mg/dL (ref 8.9–10.3)
CO2: 28 mmol/L (ref 22–32)
Chloride: 104 mmol/L (ref 101–111)
Creatinine, Ser: 1.21 mg/dL — ABNORMAL HIGH (ref 0.44–1.00)
GFR calc Af Amer: 42 mL/min — ABNORMAL LOW (ref >60)
GFR, EST NON AFRICAN AMERICAN: 36 mL/min — AB (ref >60)
Glucose, Bld: 152 mg/dL — ABNORMAL HIGH (ref 65–99)
Potassium: 3.8 mmol/L (ref 3.5–5.1)
SODIUM: 144 mmol/L (ref 135–145)

## 2017-02-15 LAB — CBC WITH DIFFERENTIAL/PLATELET
BASOS ABS: 0 10*3/uL (ref 0.0–0.1)
Basophils Relative: 0 %
EOS ABS: 0 10*3/uL (ref 0.0–0.7)
EOS PCT: 0 %
HCT: 32.4 % — ABNORMAL LOW (ref 36.0–46.0)
Hemoglobin: 10.2 g/dL — ABNORMAL LOW (ref 12.0–15.0)
Lymphocytes Relative: 11 %
Lymphs Abs: 0.8 10*3/uL (ref 0.7–4.0)
MCH: 28.3 pg (ref 26.0–34.0)
MCHC: 31.5 g/dL (ref 30.0–36.0)
MCV: 89.8 fL (ref 78.0–100.0)
MONO ABS: 0.5 10*3/uL (ref 0.1–1.0)
Monocytes Relative: 8 %
Neutro Abs: 5.5 10*3/uL (ref 1.7–7.7)
Neutrophils Relative %: 81 %
Platelets: 158 10*3/uL (ref 150–400)
RBC: 3.61 MIL/uL — AB (ref 3.87–5.11)
RDW: 16.1 % — AB (ref 11.5–15.5)
WBC: 6.8 10*3/uL (ref 4.0–10.5)

## 2017-02-15 LAB — ABO/RH: ABO/RH(D): O POS

## 2017-02-15 LAB — GLUCOSE, CAPILLARY
Glucose-Capillary: 147 mg/dL — ABNORMAL HIGH (ref 65–99)
Glucose-Capillary: 163 mg/dL — ABNORMAL HIGH (ref 65–99)

## 2017-02-15 LAB — CBG MONITORING, ED: Glucose-Capillary: 136 mg/dL — ABNORMAL HIGH (ref 65–99)

## 2017-02-15 SURGERY — FIXATION, FEMUR, NECK, PERCUTANEOUS, USING SCREW
Anesthesia: General | Site: Hip | Laterality: Right

## 2017-02-15 MED ORDER — TORSEMIDE 20 MG PO TABS
10.0000 mg | ORAL_TABLET | Freq: Every day | ORAL | Status: DC
Start: 2017-02-15 — End: 2017-02-18
  Administered 2017-02-16 – 2017-02-17 (×2): 10 mg via ORAL
  Filled 2017-02-15: qty 1
  Filled 2017-02-15: qty 0.5
  Filled 2017-02-15 (×2): qty 1
  Filled 2017-02-15: qty 0.5

## 2017-02-15 MED ORDER — DEXAMETHASONE SODIUM PHOSPHATE 10 MG/ML IJ SOLN
INTRAMUSCULAR | Status: AC
  Filled 2017-02-15: qty 1

## 2017-02-15 MED ORDER — METOCLOPRAMIDE HCL 5 MG/ML IJ SOLN: 5.0000 mg | Freq: Three times a day (TID) | INTRAMUSCULAR | Status: DC | PRN

## 2017-02-15 MED ORDER — PHENOL 1.4 % MT LIQD: 1.0000 | OROMUCOSAL | Status: DC | PRN

## 2017-02-15 MED ORDER — COLCHICINE 0.6 MG PO TABS: 0.6000 mg | ORAL_TABLET | Freq: Every day | ORAL | Status: DC | PRN

## 2017-02-15 MED ORDER — PROPOFOL 10 MG/ML IV BOLUS
INTRAVENOUS | Status: DC | PRN
  Administered 2017-02-15: 90 mg via INTRAVENOUS

## 2017-02-15 MED ORDER — LACTATED RINGERS IV SOLN
INTRAVENOUS | Status: DC
  Administered 2017-02-15: 15:00:00 via INTRAVENOUS

## 2017-02-15 MED ORDER — ONDANSETRON HCL 4 MG/2ML IJ SOLN
INTRAMUSCULAR | Status: DC | PRN
  Administered 2017-02-15: 4 mg via INTRAVENOUS

## 2017-02-15 MED ORDER — EPHEDRINE SULFATE-NACL 50-0.9 MG/10ML-% IV SOSY
PREFILLED_SYRINGE | INTRAVENOUS | Status: DC | PRN
  Administered 2017-02-15 (×2): 15 mg via INTRAVENOUS

## 2017-02-15 MED ORDER — FENTANYL CITRATE (PF) 250 MCG/5ML IJ SOLN
INTRAMUSCULAR | Status: AC
  Filled 2017-02-15: qty 5

## 2017-02-15 MED ORDER — CLOPIDOGREL BISULFATE 75 MG PO TABS
75.0000 mg | ORAL_TABLET | Freq: Every day | ORAL | Status: DC
  Administered 2017-02-15 – 2017-02-18 (×4): 75 mg via ORAL
  Filled 2017-02-15 (×4): qty 1

## 2017-02-15 MED ORDER — MENTHOL 3 MG MT LOZG: 1.0000 | LOZENGE | OROMUCOSAL | Status: DC | PRN

## 2017-02-15 MED ORDER — HYDROMORPHONE HCL 1 MG/ML IJ SOLN
0.5000 mg | INTRAMUSCULAR | Status: DC | PRN
  Filled 2017-02-15: qty 1

## 2017-02-15 MED ORDER — TRANEXAMIC ACID 1000 MG/10ML IV SOLN
1000.0000 mg | INTRAVENOUS | Status: AC
  Administered 2017-02-15: 1000 mg via INTRAVENOUS
  Filled 2017-02-15: qty 10

## 2017-02-15 MED ORDER — GLYCOPYRROLATE 0.2 MG/ML IJ SOLN
INTRAMUSCULAR | Status: DC | PRN
  Administered 2017-02-15: 0.2 mg via INTRAVENOUS

## 2017-02-15 MED ORDER — HYDROCODONE-ACETAMINOPHEN 5-325 MG PO TABS: 1.0000 | ORAL_TABLET | Freq: Four times a day (QID) | ORAL | Status: DC | PRN

## 2017-02-15 MED ORDER — EPHEDRINE SULFATE 50 MG/ML IJ SOLN
INTRAMUSCULAR | Status: AC
  Filled 2017-02-15: qty 1

## 2017-02-15 MED ORDER — HYDROCODONE-ACETAMINOPHEN 5-325 MG PO TABS
1.0000 | ORAL_TABLET | Freq: Four times a day (QID) | ORAL | Status: DC | PRN
  Administered 2017-02-16 – 2017-02-18 (×3): 1 via ORAL
  Filled 2017-02-15 (×3): qty 1

## 2017-02-15 MED ORDER — KETOROLAC TROMETHAMINE 30 MG/ML IJ SOLN
INTRAMUSCULAR | Status: DC | PRN
  Administered 2017-02-15: 30 mg via INTRAVENOUS

## 2017-02-15 MED ORDER — 0.9 % SODIUM CHLORIDE (POUR BTL) OPTIME
TOPICAL | Status: DC | PRN
  Administered 2017-02-15: 1000 mL

## 2017-02-15 MED ORDER — ALLOPURINOL 300 MG PO TABS
300.0000 mg | ORAL_TABLET | Freq: Every day | ORAL | Status: DC
  Administered 2017-02-15 – 2017-02-17 (×3): 300 mg via ORAL
  Filled 2017-02-15 (×5): qty 1

## 2017-02-15 MED ORDER — ONDANSETRON HCL 4 MG/2ML IJ SOLN
INTRAMUSCULAR | Status: AC
  Filled 2017-02-15: qty 2

## 2017-02-15 MED ORDER — FENTANYL CITRATE (PF) 100 MCG/2ML IJ SOLN
25.0000 ug | Freq: Once | INTRAMUSCULAR | Status: AC
  Administered 2017-02-15: 25 ug via INTRAVENOUS
  Filled 2017-02-15: qty 2

## 2017-02-15 MED ORDER — CLINDAMYCIN PHOSPHATE 900 MG/50ML IV SOLN: 900.0000 mg | Freq: Once | INTRAVENOUS | Status: DC

## 2017-02-15 MED ORDER — NEOSTIGMINE METHYLSULFATE 10 MG/10ML IV SOLN
INTRAVENOUS | Status: DC | PRN
  Administered 2017-02-15: 3 mg via INTRAVENOUS

## 2017-02-15 MED ORDER — ASPIRIN 81 MG PO CHEW
81.0000 mg | CHEWABLE_TABLET | Freq: Two times a day (BID) | ORAL | Status: DC
  Administered 2017-02-16 – 2017-02-18 (×4): 81 mg via ORAL
  Filled 2017-02-15 (×4): qty 1

## 2017-02-15 MED ORDER — ONDANSETRON HCL 4 MG/2ML IJ SOLN: 4.0000 mg | Freq: Four times a day (QID) | INTRAMUSCULAR | Status: DC | PRN

## 2017-02-15 MED ORDER — CLINDAMYCIN PHOSPHATE 600 MG/50ML IV SOLN
600.0000 mg | Freq: Four times a day (QID) | INTRAVENOUS | Status: AC
  Administered 2017-02-16 (×2): 600 mg via INTRAVENOUS
  Filled 2017-02-15 (×2): qty 50

## 2017-02-15 MED ORDER — ENOXAPARIN SODIUM 40 MG/0.4ML ~~LOC~~ SOLN
40.0000 mg | SUBCUTANEOUS | Status: DC
  Administered 2017-02-15: 40 mg via SUBCUTANEOUS
  Filled 2017-02-15: qty 0.4

## 2017-02-15 MED ORDER — ACETAMINOPHEN 650 MG RE SUPP: 650.0000 mg | Freq: Four times a day (QID) | RECTAL | Status: DC | PRN

## 2017-02-15 MED ORDER — CLINDAMYCIN PHOSPHATE 900 MG/50ML IV SOLN
INTRAVENOUS | Status: AC
  Filled 2017-02-15: qty 50

## 2017-02-15 MED ORDER — PHENYLEPHRINE 40 MCG/ML (10ML) SYRINGE FOR IV PUSH (FOR BLOOD PRESSURE SUPPORT)
PREFILLED_SYRINGE | INTRAVENOUS | Status: AC
  Filled 2017-02-15: qty 10

## 2017-02-15 MED ORDER — ACETAMINOPHEN 10 MG/ML IV SOLN
INTRAVENOUS | Status: AC
  Filled 2017-02-15: qty 100

## 2017-02-15 MED ORDER — ROCURONIUM BROMIDE 10 MG/ML (PF) SYRINGE
PREFILLED_SYRINGE | INTRAVENOUS | Status: DC | PRN
  Administered 2017-02-15: 40 mg via INTRAVENOUS

## 2017-02-15 MED ORDER — DEXAMETHASONE SODIUM PHOSPHATE 10 MG/ML IJ SOLN
INTRAMUSCULAR | Status: DC | PRN
  Administered 2017-02-15: 5 mg via INTRAVENOUS

## 2017-02-15 MED ORDER — ACETAMINOPHEN 325 MG PO TABS: 650.0000 mg | ORAL_TABLET | Freq: Four times a day (QID) | ORAL | Status: DC | PRN

## 2017-02-15 MED ORDER — METOCLOPRAMIDE HCL 5 MG PO TABS: 5.0000 mg | ORAL_TABLET | Freq: Three times a day (TID) | ORAL | Status: DC | PRN

## 2017-02-15 MED ORDER — LIDOCAINE 2% (20 MG/ML) 5 ML SYRINGE
INTRAMUSCULAR | Status: AC
  Filled 2017-02-15: qty 5

## 2017-02-15 MED ORDER — ONDANSETRON HCL 4 MG PO TABS: 4.0000 mg | ORAL_TABLET | Freq: Four times a day (QID) | ORAL | Status: DC | PRN

## 2017-02-15 MED ORDER — FAMOTIDINE 20 MG PO TABS
20.0000 mg | ORAL_TABLET | Freq: Every day | ORAL | Status: DC
  Administered 2017-02-16 – 2017-02-18 (×3): 20 mg via ORAL
  Filled 2017-02-15 (×4): qty 1

## 2017-02-15 MED ORDER — BUPIVACAINE-EPINEPHRINE 0.25% -1:200000 IJ SOLN
INTRAMUSCULAR | Status: DC | PRN
  Administered 2017-02-15: 50 mL

## 2017-02-15 MED ORDER — ACETAMINOPHEN 10 MG/ML IV SOLN
1000.0000 mg | INTRAVENOUS | Status: AC
  Administered 2017-02-15: 1000 mg via INTRAVENOUS

## 2017-02-15 MED ORDER — FESOTERODINE FUMARATE ER 4 MG PO TB24
4.0000 mg | ORAL_TABLET | Freq: Every day | ORAL | Status: DC
  Administered 2017-02-17 – 2017-02-18 (×2): 4 mg via ORAL
  Filled 2017-02-15 (×6): qty 1

## 2017-02-15 MED ORDER — FENTANYL CITRATE (PF) 100 MCG/2ML IJ SOLN
INTRAMUSCULAR | Status: DC | PRN
  Administered 2017-02-15: 50 ug via INTRAVENOUS
  Administered 2017-02-15: 100 ug via INTRAVENOUS

## 2017-02-15 MED ORDER — BUPIVACAINE-EPINEPHRINE 0.25% -1:200000 IJ SOLN
INTRAMUSCULAR | Status: AC
  Filled 2017-02-15: qty 1

## 2017-02-15 MED ORDER — ATORVASTATIN CALCIUM 20 MG PO TABS
20.0000 mg | ORAL_TABLET | Freq: Every day | ORAL | Status: DC
  Administered 2017-02-16 – 2017-02-18 (×3): 20 mg via ORAL
  Filled 2017-02-15 (×6): qty 1

## 2017-02-15 MED ORDER — MECLIZINE HCL 25 MG PO TABS
25.0000 mg | ORAL_TABLET | Freq: Three times a day (TID) | ORAL | Status: DC | PRN
  Filled 2017-02-15: qty 1

## 2017-02-15 MED ORDER — POVIDONE-IODINE 10 % EX SWAB: 2.0000 "application " | Freq: Once | CUTANEOUS | Status: DC

## 2017-02-15 MED ORDER — MORPHINE SULFATE (PF) 2 MG/ML IV SOLN: 0.5000 mg | INTRAVENOUS | Status: DC | PRN

## 2017-02-15 MED ORDER — ALBUMIN HUMAN 5 % IV SOLN
INTRAVENOUS | Status: DC | PRN
  Administered 2017-02-15: 19:00:00 via INTRAVENOUS

## 2017-02-15 MED ORDER — COQ10 100 MG PO CAPS: 100.0000 mg | ORAL_CAPSULE | Freq: Every day | ORAL | Status: DC

## 2017-02-15 MED ORDER — PHENYLEPHRINE 40 MCG/ML (10ML) SYRINGE FOR IV PUSH (FOR BLOOD PRESSURE SUPPORT)
PREFILLED_SYRINGE | INTRAVENOUS | Status: DC | PRN
  Administered 2017-02-15: 80 ug via INTRAVENOUS

## 2017-02-15 MED ORDER — CLINDAMYCIN PHOSPHATE 900 MG/50ML IV SOLN
900.0000 mg | INTRAVENOUS | Status: AC
  Administered 2017-02-15: 900 mg via INTRAVENOUS
  Filled 2017-02-15: qty 50

## 2017-02-15 MED ORDER — SODIUM CHLORIDE 0.9 % IJ SOLN
INTRAMUSCULAR | Status: DC | PRN
  Administered 2017-02-15: 30 mL via INTRAVENOUS

## 2017-02-15 MED ORDER — LIDOCAINE 2% (20 MG/ML) 5 ML SYRINGE
INTRAMUSCULAR | Status: DC | PRN
  Administered 2017-02-15: 40 mg via INTRAVENOUS

## 2017-02-15 MED ORDER — KETOROLAC TROMETHAMINE 30 MG/ML IJ SOLN
INTRAMUSCULAR | Status: AC
  Filled 2017-02-15: qty 1

## 2017-02-15 MED ORDER — LORATADINE 10 MG PO TABS
10.0000 mg | ORAL_TABLET | Freq: Every day | ORAL | Status: DC
  Administered 2017-02-16 – 2017-02-18 (×3): 10 mg via ORAL
  Filled 2017-02-15 (×5): qty 1

## 2017-02-15 MED ORDER — SODIUM CHLORIDE 0.9 % IV SOLN
INTRAVENOUS | Status: DC
  Administered 2017-02-15 – 2017-02-17 (×3): via INTRAVENOUS

## 2017-02-15 MED ORDER — CHLORHEXIDINE GLUCONATE 4 % EX LIQD: 60.0000 mL | Freq: Once | CUTANEOUS | Status: DC

## 2017-02-15 MED ORDER — PROPOFOL 10 MG/ML IV BOLUS
INTRAVENOUS | Status: AC
  Filled 2017-02-15: qty 20

## 2017-02-15 SURGICAL SUPPLY — 57 items
BLADE CLIPPER SURG (BLADE) IMPLANT
CABLE READY CERCLAGE W/CRIP (Trauma Fixation) ×4 IMPLANT
CAPT HIP HEMI 2 ×4 IMPLANT
CHLORAPREP W/TINT 26ML (MISCELLANEOUS) ×4 IMPLANT
COVER SURGICAL LIGHT HANDLE (MISCELLANEOUS) ×4 IMPLANT
DERMABOND ADVANCED (GAUZE/BANDAGES/DRESSINGS) ×2
DERMABOND ADVANCED .7 DNX12 (GAUZE/BANDAGES/DRESSINGS) ×2 IMPLANT
DRAPE C-ARM 42X72 X-RAY (DRAPES) ×4 IMPLANT
DRAPE IMP U-DRAPE 54X76 (DRAPES) ×8 IMPLANT
DRAPE STERI IOBAN 125X83 (DRAPES) ×4 IMPLANT
DRAPE U-SHAPE 47X51 STRL (DRAPES) ×12 IMPLANT
DRSG AQUACEL AG ADV 3.5X10 (GAUZE/BANDAGES/DRESSINGS) ×4 IMPLANT
ELECT BLADE 4.0 EZ CLEAN MEGAD (MISCELLANEOUS) ×4
ELECT REM PT RETURN 9FT ADLT (ELECTROSURGICAL) ×4
ELECTRODE BLDE 4.0 EZ CLN MEGD (MISCELLANEOUS) ×2 IMPLANT
ELECTRODE REM PT RTRN 9FT ADLT (ELECTROSURGICAL) ×2 IMPLANT
EVACUATOR 1/8 PVC DRAIN (DRAIN) IMPLANT
GLOVE BIO SURGEON STRL SZ8.5 (GLOVE) ×8 IMPLANT
GLOVE BIOGEL PI IND STRL 6.5 (GLOVE) ×2 IMPLANT
GLOVE BIOGEL PI IND STRL 8.5 (GLOVE) ×2 IMPLANT
GLOVE BIOGEL PI INDICATOR 6.5 (GLOVE) ×2
GLOVE BIOGEL PI INDICATOR 8.5 (GLOVE) ×2
GLOVE SURG SS PI 6.5 STRL IVOR (GLOVE) ×12 IMPLANT
GLOVE SURG SS PI 8.0 STRL IVOR (GLOVE) ×4 IMPLANT
GOWN STRL REUS W/ TWL LRG LVL3 (GOWN DISPOSABLE) ×4 IMPLANT
GOWN STRL REUS W/TWL 2XL LVL3 (GOWN DISPOSABLE) ×4 IMPLANT
GOWN STRL REUS W/TWL LRG LVL3 (GOWN DISPOSABLE) ×4
HANDPIECE INTERPULSE COAX TIP (DISPOSABLE) ×2
HOOD PEEL AWAY FLYTE STAYCOOL (MISCELLANEOUS) ×8 IMPLANT
KIT BASIN OR (CUSTOM PROCEDURE TRAY) ×4 IMPLANT
KIT ROOM TURNOVER OR (KITS) ×4 IMPLANT
MANIFOLD NEPTUNE II (INSTRUMENTS) ×4 IMPLANT
MARKER SKIN DUAL TIP RULER LAB (MISCELLANEOUS) ×4 IMPLANT
NEEDLE SPNL 18GX3.5 QUINCKE PK (NEEDLE) ×4 IMPLANT
NS IRRIG 1000ML POUR BTL (IV SOLUTION) ×4 IMPLANT
PACK TOTAL JOINT (CUSTOM PROCEDURE TRAY) ×4 IMPLANT
PACK UNIVERSAL I (CUSTOM PROCEDURE TRAY) ×4 IMPLANT
PAD ARMBOARD 7.5X6 YLW CONV (MISCELLANEOUS) ×8 IMPLANT
SAW OSC TIP CART 19.5X105X1.3 (SAW) ×4 IMPLANT
SEALER BIPOLAR AQUA 6.0 (INSTRUMENTS) IMPLANT
SET HNDPC FAN SPRY TIP SCT (DISPOSABLE) ×2 IMPLANT
SLEEVE SURGEON STRL (DRAPES) ×8 IMPLANT
SUCTION FRAZIER HANDLE 10FR (MISCELLANEOUS) ×2
SUCTION TUBE FRAZIER 10FR DISP (MISCELLANEOUS) ×2 IMPLANT
SUT ETHIBOND NAB CT1 #1 30IN (SUTURE) ×8 IMPLANT
SUT MNCRL AB 3-0 PS2 18 (SUTURE) ×4 IMPLANT
SUT MON AB 2-0 CT1 36 (SUTURE) ×4 IMPLANT
SUT VIC AB 1 CT1 27 (SUTURE) ×2
SUT VIC AB 1 CT1 27XBRD ANBCTR (SUTURE) ×2 IMPLANT
SUT VIC AB 2-0 CT1 27 (SUTURE) ×2
SUT VIC AB 2-0 CT1 TAPERPNT 27 (SUTURE) ×2 IMPLANT
SUT VLOC 180 0 24IN GS25 (SUTURE) ×4 IMPLANT
SYR 50ML LL SCALE MARK (SYRINGE) ×4 IMPLANT
TOWEL OR 17X24 6PK STRL BLUE (TOWEL DISPOSABLE) ×4 IMPLANT
TOWEL OR 17X26 10 PK STRL BLUE (TOWEL DISPOSABLE) ×4 IMPLANT
TRAY FOLEY CATH SILVER 16FR (SET/KITS/TRAYS/PACK) IMPLANT
WATER STERILE IRR 1000ML POUR (IV SOLUTION) ×12 IMPLANT

## 2017-02-15 NOTE — H&P (Addendum)
History and Physical    [name] [name] ZOX:[id number] DOB: [birth year] DOA: 02/14/2017  PCP: Reynolds Bowl, MD   Patient coming from: Home.  I have personally briefly reviewed patient's old medical records in [facility]  Chief Complaint: Fall.  HPI: [name] [name] is a 83 y.o. female with medical history significant of osteoarthritis, type 2 diabetes, dizziness, gout, gouty spondylitis, history of CVA, hypertension, back pain, sciatica who is brought to the emergency department after having a mechanical fall at home hitting her right knee.  She initially refused transport to the emergency department, but subsequently decided to, after the knee became swollen and painful.  She denies fever, chills, headaches, dizziness, dyspnea, chest pain, palpitations, diaphoresis, PND orthopnea.  No pitting edema of the lower extremities, however the patient has lymphedema.  Denies abdominal pain, nausea, emesis, diarrhea, constipation, melena or hematochezia.  She denies dysuria, frequency or hematuria.  ED Course: Initial vital signs in the emergency department temperature 36.4C, (97.6 F) pulse 59, respirations 16, blood pressure 130/62 mmHg and O2 sat 98% on room air.  Patient received fentanyl 25 mg in the emergency department.  Her workup shows a CBC with a white count of 6.8 with normal differential, hemoglobin 10.2 g/dL and platelets 811.  Her last hemoglobin on record is also 10.2 g/dL almost 6 years ago.  Her BMP shows a glucose of 152, BUN of 22 and creatinine of 1.21 mg/dL.  Electrolytes are normal.  Imaging: Right hip/femur shows probably impacted transverse subcapital fracture of the right proximal femur no displacement no dislocation.  Her chest radiograph showed left lower lobe atelectasis versus infiltrate.  Please see images and full radiology report for further detail.  Review of Systems: As per HPI otherwise 10 point review of systems negative.    Past Medical History:    Diagnosis Date  . Arthritis   . Diabetes mellitus type II 02/19/2011  . Dizziness   . Gout   . Gout spondylitis   . 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 Hypertension   . Sciatica   . Stroke [facility])     Past Surgical History:  Procedure Laterality Date  . ABDOMINAL HYSTERECTOMY       reports that  has never smoked. she has never used smokeless tobacco. She reports that she does not drink alcohol or use drugs.  Allergies  Allergen Reactions  . Bee Venom Swelling  . Penicillins Other (See Comments)    Has patient had a PCN reaction causing immediate rash, facial/tongue/throat swelling, SOB or lightheadedness with hypotension: Unknown Has patient had a PCN reaction causing severe rash involving mucus membranes or skin necrosis: Unknown Has patient had a PCN reaction that required hospitalization: Unknown Has patient had a PCN reaction occurring within the last 10 years: Unknown If all of the above answers are "NO", then may proceed with Cephalosporin use.     Family History  Problem Relation Age of Onset  . Stroke Mother   . Stroke Father     Prior to Admission medications   Medication Sig Start Date End Date Taking? Authorizing Provider  allopurinol (ZYLOPRIM) 100 MG tablet Take 300 mg by mouth at bedtime.     Yes [provider]  aspirin EC 81 MG tablet Take 81 mg by mouth daily.   Yes [provider]  atorvastatin (LIPITOR) 20 MG tablet Take 1 tablet by mouth daily. 12/22/16  Yes [provider]  clopidogrel (PLAVIX) 75 MG tablet  Take 75 mg by mouth daily.  06/08/14  Yes [provider]  Coenzyme Q10 (COQ10 PO) Take 100 mg by mouth daily.   Yes [provider]  colchicine 0.6 MG tablet Take 0.6 mg by mouth daily as needed. gout    Yes [provider]  famotidine (PEPCID) 20 MG tablet Take 20 mg by mouth daily.  07/06/14  Yes [provider]  loratadine (CLARITIN) 10 MG  tablet Take 10 mg by mouth daily.   Yes [provider]  meclizine (ANTIVERT) 25 MG tablet Take 25 mg by mouth 3 (three) times daily as needed. For nausea   Yes [provider]  tolterodine (DETROL LA) 2 MG 24 hr capsule Take 4 mg by mouth daily.    Yes [provider]  torsemide (DEMADEX) 10 MG tablet Take 10 mg by mouth daily.  06/28/14  Yes [provider]    Physical Exam: Vitals:   02/15/17 0131 02/15/17 0139 02/15/17 0300 02/15/17 0330  BP: 135/62 135/62 (!) 118/58 106/90  Pulse:  72 69 66  Resp: 20 20 18 19   Temp:      TempSrc:      SpO2:  93% 94% (!) 80%  Weight:      Height:        Constitutional: NAD, calm, comfortable Eyes: PERRL, lids and conjunctivae normal ENMT: Mucous membranes and lips are dry. Posterior pharynx clear of any exudate or lesions. Neck: normal, supple, no masses, no thyromegaly Respiratory: Decreased breath sounds on bases, otherwise clear to auscultation bilaterally, no wheezing, no crackles. Normal respiratory effort. No accessory muscle use.  Cardiovascular: Regular rate and rhythm, no murmurs / rubs / gallops. No lower extremities edema.  Positive stage II lymphedema.  2+ pedal pulses. No carotid bruits.  Abdomen: Soft, no tenderness, no masses palpated. No hepatosplenomegaly. Bowel sounds positive.  Musculoskeletal: no clubbing / cyanosis.  Right knee looks adenomatosis and tender to palpation with decreased ROM.  Her RLE looks shortened and externally rotated. Normal muscle tone.  Skin: no rashes, lesions, ulcers on limited dermatological exam. Neurologic: CN 2-12 grossly intact. Sensation intact, DTR normal. Strength 5/5 in all 4.  Psychiatric: Normal judgment and insight. Alert and oriented x 3. Normal mood.    Labs on Admission: I have personally reviewed following labs and imaging studies  CBC: Recent Labs  Lab 02/14/17 2356  WBC 6.8  NEUTROABS 5.5  HGB 10.2*  HCT 32.4*  MCV 89.8  PLT 158   Basic  Metabolic Panel: Recent Labs  Lab 02/14/17 2356  NA 144  K 3.8  CL 104  CO2 28  GLUCOSE 152*  BUN 22*  CREATININE 1.21*  CALCIUM 9.0   GFR: Estimated Creatinine Clearance: 20.1 mL/min (A) (by C-G formula based on SCr of 1.21 mg/dL (H)). Liver Function Tests: No results for input(s): AST, ALT, ALKPHOS, BILITOT, PROT, ALBUMIN in the last 168 hours. No results for input(s): LIPASE, AMYLASE in the last 168 hours. No results for input(s): AMMONIA in the last 168 hours. Coagulation Profile: No results for input(s): INR, PROTIME in the last 168 hours. Cardiac Enzymes: No results for input(s): CKTOTAL, CKMB, CKMBINDEX, TROPONINI in the last 168 hours. BNP (last 3 results) No results for input(s): PROBNP in the last 8760 hours. HbA1C: No results for input(s): HGBA1C in the last 72 hours. CBG: No results for input(s): GLUCAP in the last 168 hours. Lipid Profile: No results for input(s): CHOL, HDL, LDLCALC, TRIG, CHOLHDL, LDLDIRECT in the last 72  hours. Thyroid Function Tests: No results for input(s): TSH, T4TOTAL, FREET4, T3FREE, THYROIDAB in the last 72 hours. Anemia Panel: No results for input(s): VITAMINB12, FOLATE, FERRITIN, TIBC, IRON, RETICCTPCT in the last 72 hours. Urine analysis:    Component Value Date/Time   COLORURINE BROWN (A) 03/09/2011 1613   APPEARANCEUR CLOUDY (A) 03/09/2011 1613   LABSPEC >1.030 (H) 03/09/2011 1613   PHURINE 5.0 03/09/2011 1613   GLUCOSEU NEGATIVE 03/09/2011 1613   HGBUR NEGATIVE 03/09/2011 1613   BILIRUBINUR SMALL (A) 03/09/2011 1613   KETONESUR TRACE (A) 03/09/2011 1613   PROTEINUR 100 (A) 03/09/2011 1613   UROBILINOGEN 0.2 03/09/2011 1613   NITRITE NEGATIVE 03/09/2011 1613   LEUKOCYTESUR NEGATIVE 03/09/2011 1613    Radiological Exams on Admission: Dg Chest 2 View  Result Date: 02/15/2017 CLINICAL DATA:  Right leg pain after a fall today. History of diabetes, hypertension, stroke EXAM: CHEST  2 VIEW COMPARISON:  03/09/2011 FINDINGS:  Shallow inspiration. Heart size and pulmonary vascularity are normal for technique. Suggestion of infiltration or atelectasis in the left lung base behind the heart. No blunting of costophrenic angles. No pneumothorax. Calcification of the aorta. Degenerative changes in the spine and shoulders. Mediastinal contours appear intact. IMPRESSION: Infiltration or atelectasis in the left lung base behind the heart. Aortic atherosclerosis. Electronically Signed   By: Burman NievesWilliam  Stevens M.D.   On: 02/15/2017 00:09   Dg Knee Complete 4 Views Right  Result Date: 02/14/2017 CLINICAL DATA:  Acute onset of right knee pain and swelling. EXAM: RIGHT KNEE - COMPLETE 4+ VIEW COMPARISON:  None. FINDINGS: There is no evidence of fracture or dislocation. There is significant narrowing at the lateral compartment, with associated sclerosis. Cortical irregularity is noted at all 3 compartments. Prominent marginal osteophytes are seen at all 3 compartments, and along the tibial spine. Wall osteophytes are also seen. There is chronic flattening of the patella. Trace knee joint fluid remains within normal limits. Mild edema is noted at the level of the patellar tendon. IMPRESSION: 1. No evidence of fracture or dislocation. 2. Prominent tricompartmental osteoarthritis, most significant at the lateral compartment. Electronically Signed   By: Roanna RaiderJeffery  Chang M.D.   On: 02/14/2017 22:56   Dg Hip Unilat W Or Wo Pelvis 2-3 Views Right  Result Date: 02/15/2017 CLINICAL DATA:  Right leg pain after fall today. History of diabetes, hypertension, stroke. EXAM: DG HIP (WITH OR WITHOUT PELVIS) 2-3V RIGHT COMPARISON:  None. FINDINGS: Diffuse bone demineralization. Degenerative changes in the lower lumbar spine and hips. Focal cortical irregularity and slight lucency along the subcapital right femoral neck consistent with a mildly impacted fracture. No displacement or dislocation. Pelvis appears intact. SI joints and symphysis pubis are not  displaced. Calcification adjacent to the pubic symphysis consistent with osteitis pubis. Vascular calcifications. IMPRESSION: Probable impacted transverse subcapital fracture of the right proximal femur. No displacement or dislocation. Electronically Signed   By: Burman NievesWilliam  Stevens M.D.   On: 02/15/2017 00:06   Dg Femur Min 2 Views Right  Result Date: 02/15/2017 CLINICAL DATA:  Right leg pain after a fall today. EXAM: RIGHT FEMUR 2 VIEWS COMPARISON:  None. FINDINGS: Diffuse bone demineralization. Cortical irregularity with vague linear lucency across a subcapital femoral neck consistent with nondisplaced impacted fracture. Femoral shaft appears intact. Severe degenerative changes in the right knee with lateral greater than medial compartment narrowing, osteophyte formation, and cartilaginous calcification. No significant effusion. Vascular calcifications in the soft tissues. IMPRESSION: Probable impacted subcapital fracture of the proximal right femur. Degenerative changes in the knee  joint. No additional fractures identified. Electronically Signed   By: Burman NievesWilliam  Stevens M.D.   On: 02/15/2017 00:08   02/20/2011 echocardiogram without contrast ------------------------------------------------------------ LV EF: 55% -  60%  ------------------------------------------------------------ Indications:   TIA 435.9.  ------------------------------------------------------------ History:  PMH: Dizziness, bradycardia Risk factors: Hypertension.  ------------------------------------------------------------ Study Conclusions  - Left ventricle: The cavity size was normal. Borderline LVH. Systolic function was normal. The estimated ejection fraction was in the range of 55% to 60%. Wall motion was normal; there were no regional wall motion abnormalities. - Aortic valve: Trivial regurgitation. - Mitral valve: Mild regurgitation. - Left atrium: The atrium was moderately dilated. - Right ventricle:  The cavity size was normal. Wall thickness was mildly increased. - Right atrium: The atrium was mildly dilated. - Atrial septum: No defect or patent foramen ovale was identified. - Pulmonary arteries: Systolic pressure was mildly increased. PA peak pressure: 53mm Hg (S).  EKG: Independently reviewed Vent. rate 83 BPM PR interval * ms QRS duration 124 ms QT/QTc 393/462 ms P-R-T axes 75 -57 85 Sinus rhythm Prolonged PR interval Left bundle branch block Not significantly changed from previous tracings.  Assessment/Plan Principal Problem:   Closed right hip fracture, initial encounter (HCC) Ad[facility] to [facility] tele[facility] bed/inpatient Keep n.p.o. Analgesics as needed. Bucks traction per routine order. Hold aspirin and Plavix for now. Orthopedic surgery will evaluate and likely surgically intervene today or tomorrow.  Active Problems:   HTN (hypertension) Continue torsemide 10 mg p.o. daily. Monitor blood pressure, renal function and electrolytes.    Diabetes mellitus, type II (HCC) N.p.o. for now. Postop carbohydrate modified diet. CBG monitoring every 6 hours while n.p.o. Switch to before meals and at bedtime once cleared for diet.    Anemia Monitor hematocrit and hemoglobin. Check anemia profile.    H/O: CVA (cardiovascular accident) Does not seem to have any specific sequelae. Aspirin and clopidogrel have been held pending surgery.    Atelectasis of left lung O2 sat ranging from 92-94%. It increases to the high 90s on deep inspiration. Discussed with the patient and ordered incentive spirometry every hour while awake.    DVT prophylaxis: SCDs. Code Status: Full code. Family Communication: Two of her sounds are present in the ED. Disposition Plan: Admit to [facility] for orthopedic surgery evaluation and surgical intervention. Consults called: Dr. Eulah PontMurphy from orthopedic surgery was contacted by Dr. Consuella Loseancourt. Admission status: Inpatient/telemetry.   Bobette Moavid Manuel  Danella Philson MD [facility] Pager [phone].  If 7PM-7AM, please contact night-coverage [email or web address] Password [email or web address]  02/15/2017, 4:08 AM

## 2017-02-15 NOTE — ED Notes (Signed)
Four nurses have attempted IV access without success, medication given IM, Dr. Robb Matarrtiz will place order for PICC line.

## 2017-02-15 NOTE — Progress Notes (Addendum)
Patient was seen and evaluated in the emergency department.  She is to be transferred to [facility] for evaluation by orthopedic surgery given her subcapital fracture of the right proximal femur.  She denies being in any pain.  She appears in no acute distress.  Agree with Dr. Robb Matarrtiz' s history and physical as well as assessment and plan.  Patient will most likely benefit from skilled nursing facility placement when she is stable for discharge.  Holding Lovenox at this time given possible surgery.

## 2017-02-15 NOTE — Consult Note (Signed)
Reason for Consult:Right hip fx Referring Physician: A [name] is an 83 y.o. female with DM, hx/o gout, hx/o CVA, and HTN. HPI: [name] [name] was ambulating in her home on Sunday when she tripped over something and fell. Her recollection of events is not the best. She had significant knee pain and was unable to get up. She was transported via EMS to [facility] where she was diagnosed with a subcap femur fx on the right side and orthopedic surgery was consulted. She was transferred to [facility] for definitive care. She denies presyncope/syncope.  She is a household ambulator with a cane. Lives with her son.  Past Medical History:  Diagnosis Date  . Arthritis   . Diabetes mellitus type II 02/19/2011  . Dizziness   . Gout   . Gout spondylitis   . 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 Hypertension   . Sciatica   . Stroke [facility])     Past Surgical History:  Procedure Laterality Date  . ABDOMINAL HYSTERECTOMY      Family History  Problem Relation Age of Onset  . Stroke Mother   . Stroke Father     Social History:  reports that  has never smoked. she has never used smokeless tobacco. She reports that she does not drink alcohol or use drugs.  Allergies:  Allergies  Allergen Reactions  . Bee Venom Swelling  . Penicillins Other (See Comments)    Has patient had a PCN reaction causing immediate rash, facial/tongue/throat swelling, SOB or lightheadedness with hypotension: Unknown Has patient had a PCN reaction causing severe rash involving mucus membranes or skin necrosis: Unknown Has patient had a PCN reaction that required hospitalization: Unknown Has patient had a PCN reaction occurring within the last 10 years: Unknown If all of the above answers are "NO", then may proceed with Cephalosporin use.     Medications: I have reviewed the patient's current medications.  Results for orders placed or performed during the hospital encounter of  02/14/17 (from the past 48 hour(s))  CBC with Differential/Platelet     Status: Abnormal   Collection Time: 02/14/17 11:56 PM  Result Value Ref Range   WBC 6.8 4.0 - 10.5 K/uL   RBC 3.61 (L) 3.87 - 5.11 MIL/uL   Hemoglobin 10.2 (L) 12.0 - 15.0 g/dL   HCT 32.4 (L) 36.0 - 46.0 %   MCV 89.8 78.0 - 100.0 fL   MCH 28.3 26.0 - 34.0 pg   MCHC 31.5 30.0 - 36.0 g/dL   RDW 16.1 (H) 11.5 - 15.5 %   Platelets 158 150 - 400 K/uL   Neutrophils Relative % 81 %   Neutro Abs 5.5 1.7 - 7.7 K/uL   Lymphocytes Relative 11 %   Lymphs Abs 0.8 0.7 - 4.0 K/uL   Monocytes Relative 8 %   Monocytes Absolute 0.5 0.1 - 1.0 K/uL   Eosinophils Relative 0 %   Eosinophils Absolute 0.0 0.0 - 0.7 K/uL   Basophils Relative 0 %   Basophils Absolute 0.0 0.0 - 0.1 K/uL  Basic metabolic panel     Status: Abnormal   Collection Time: 02/14/17 11:56 PM  Result Value Ref Range   Sodium 144 135 - 145 mmol/L   Potassium 3.8 3.5 - 5.1 mmol/L   Chloride 104 101 - 111 mmol/L   CO2 28 22 - 32 mmol/L   Glucose, Bld 152 (H) 65 - 99 mg/dL   BUN 22 (H) 6 - 20  mg/dL   Creatinine, Ser 1.21 (H) 0.44 - 1.00 mg/dL   Calcium 9.0 8.9 - 10.3 mg/dL   GFR calc non Af Amer 36 (L) >60 mL/min   GFR calc Af Amer 42 (L) >60 mL/min    Comment: (NOTE) The eGFR has been calculated using the CKD EPI equation. This calculation has not been validated in all clinical situations. eGFR's persistently <60 mL/min signify possible Chronic Kidney Disease.    Anion gap 12 5 - 15  CBG monitoring, ED     Status: Abnormal   Collection Time: 02/15/17  6:25 AM  Result Value Ref Range   Glucose-Capillary 136 (H) 65 - 99 mg/dL    Dg Chest 2 View  Result Date: 02/15/2017 CLINICAL DATA:  Right leg pain after a fall today. History of diabetes, hypertension, stroke EXAM: CHEST  2 VIEW COMPARISON:  03/09/2011 FINDINGS: Shallow inspiration. Heart size and pulmonary vascularity are normal for technique. Suggestion of infiltration or atelectasis in the left  lung base behind the heart. No blunting of costophrenic angles. No pneumothorax. Calcification of the aorta. Degenerative changes in the spine and shoulders. Mediastinal contours appear intact. IMPRESSION: Infiltration or atelectasis in the left lung base behind the heart. Aortic atherosclerosis. Electronically Signed   By: Lucienne Capers M.D.   On: 02/15/2017 00:09   Dg Knee Complete 4 Views Right  Result Date: 02/14/2017 CLINICAL DATA:  Acute onset of right knee pain and swelling. EXAM: RIGHT KNEE - COMPLETE 4+ VIEW COMPARISON:  None. FINDINGS: There is no evidence of fracture or dislocation. There is significant narrowing at the lateral compartment, with associated sclerosis. Cortical irregularity is noted at all 3 compartments. Prominent marginal osteophytes are seen at all 3 compartments, and along the tibial spine. Wall osteophytes are also seen. There is chronic flattening of the patella. Trace knee joint fluid remains within normal limits. Mild edema is noted at the level of the patellar tendon. IMPRESSION: 1. No evidence of fracture or dislocation. 2. Prominent tricompartmental osteoarthritis, most significant at the lateral compartment. Electronically Signed   By: Garald Balding M.D.   On: 02/14/2017 22:56   Dg Hip Unilat W Or Wo Pelvis 2-3 Views Right  Result Date: 02/15/2017 CLINICAL DATA:  Right leg pain after fall today. History of diabetes, hypertension, stroke. EXAM: DG HIP (WITH OR WITHOUT PELVIS) 2-3V RIGHT COMPARISON:  None. FINDINGS: Diffuse bone demineralization. Degenerative changes in the lower lumbar spine and hips. Focal cortical irregularity and slight lucency along the subcapital right femoral neck consistent with a mildly impacted fracture. No displacement or dislocation. Pelvis appears intact. SI joints and symphysis pubis are not displaced. Calcification adjacent to the pubic symphysis consistent with osteitis pubis. Vascular calcifications. IMPRESSION: Probable impacted  transverse subcapital fracture of the right proximal femur. No displacement or dislocation. Electronically Signed   By: Lucienne Capers M.D.   On: 02/15/2017 00:06   Dg Femur Min 2 Views Right  Result Date: 02/15/2017 CLINICAL DATA:  Right leg pain after a fall today. EXAM: RIGHT FEMUR 2 VIEWS COMPARISON:  None. FINDINGS: Diffuse bone demineralization. Cortical irregularity with vague linear lucency across a subcapital femoral neck consistent with nondisplaced impacted fracture. Femoral shaft appears intact. Severe degenerative changes in the right knee with lateral greater than medial compartment narrowing, osteophyte formation, and cartilaginous calcification. No significant effusion. Vascular calcifications in the soft tissues. IMPRESSION: Probable impacted subcapital fracture of the proximal right femur. Degenerative changes in the knee joint. No additional fractures identified. Electronically Signed   By:  Lucienne Capers M.D.   On: 02/15/2017 00:08    Review of Systems  Constitutional: Negative for weight loss.  HENT: Negative for ear discharge, ear pain, hearing loss and tinnitus.   Eyes: Negative for blurred vision, double vision, photophobia and pain.  Respiratory: Negative for cough, sputum production and shortness of breath.   Cardiovascular: Negative for chest pain.  Gastrointestinal: Negative for abdominal pain, nausea and vomiting.  Genitourinary: Negative for dysuria, flank pain, frequency and urgency.  Musculoskeletal: Positive for joint pain (Right knee). Negative for back pain, falls, myalgias and neck pain.  Neurological: Negative for dizziness, tingling, sensory change, focal weakness, loss of consciousness and headaches.  Endo/Heme/Allergies: Does not bruise/bleed easily.  Psychiatric/Behavioral: Negative for depression, memory loss and substance abuse. The patient is not nervous/anxious.    Blood pressure (!) 118/57, pulse 75, temperature 97.6 F (36.4 C), temperature  source Oral, resp. rate 18, height '4\' 10"'  (1.473 m), weight 58.1 kg (128 lb), SpO2 97 %. Physical Exam  Constitutional: She appears well-developed and well-nourished. No distress.  HENT:  Head: Normocephalic.  Eyes: Conjunctivae are normal. Right eye exhibits no discharge. Left eye exhibits no discharge. No scleral icterus.  Neck: Normal range of motion.  Cardiovascular: Normal rate and regular rhythm.  Respiratory: Effort normal. No respiratory distress.  Musculoskeletal:  Bilateral shoulder, elbow, wrist, digits- no skin wounds, nontender, no instability, no blocks to motion  Sens  Ax/R/M/U intact  Mot   Ax/ R/ PIN/ M/ AIN/ U intact  Rad 2+  Pelvis--no traumatic wounds or rash, no ecchymosis, stable to manual stress, nontender  RLE No traumatic wounds, ecchymosis, or rash  Diffuse moderate TTP knee, mild TTP forefoot, no TTP hip  Generalized RLE edema  Knee stable to varus/ valgus and anterior/posterior stress but limited by pain  Sens DPN, SPN, TN intact  Motor EHL, ext, flex, evers 5/5  DP 2+, PT 1+, 3+ pitting edema  LLE No traumatic wounds, ecchymosis, or rash  Nontender  No knee or ankle effusion  Knee stable to varus/ valgus and anterior/posterior stress  Sens DPN, SPN, TN intact  Motor EHL, ext, flex, evers 5/5  DP 2+, PT 1+, 2+ pitting edema  Neurological: She is alert.  Skin: Skin is warm and dry. She is not diaphoretic.  Psychiatric: She has a normal mood and affect. Her behavior is normal.    Assessment/Plan: Fall Displaced Right subcapital femoral neck fx -- Plan for R hip hemiarthropalsty. Please keep NPO until then. May need further imaging of her knee if she continues to have significant pain. Anticipate PT/OT and SNF placement following surgery. Multiple medical problems -- per primary service    [name] [name] [facility]  02/15/2017, 2:44 PM

## 2017-02-15 NOTE — Transfer of Care (Signed)
Immediate Anesthesia Transfer of Care Note  Patient: [name] [name]  Procedure(s) Performed: ANTERIOR APPROACH HEMI HIP ARTHROPLASTY (Right Hip)  Patient Location: PACU  Anesthesia Type:General  Level of Consciousness: awake and alert   Airway & Oxygen Therapy: Patient Spontanous Breathing and Patient connected to nasal cannula oxygen  Post-op Assessment: Report given to RN and Post -op Vital signs reviewed and stable  Post vital signs: Reviewed and stable  Last Vitals:  Vitals:   02/15/17 1030 02/15/17 1951  BP: (!) 118/57   Pulse:    Resp: 18   Temp:  (P) 36.6 C  SpO2:      Last Pain:  Vitals:   02/15/17 1951  TempSrc:   PainSc: (P) 0-No pain         Complications: No apparent anesthesia complications

## 2017-02-15 NOTE — Op Note (Signed)
OPERATIVE REPORT  SURGEON: Samson FredericBrian Ezzie Senat, MD   ASSISTANT: Hart CarwinJustin Queen, RNFA  PREOPERATIVE DIAGNOSIS: Displaced Right femoral neck fracture.   POSTOPERATIVE DIAGNOSIS: Displaced Right femoral neck fracture.   PROCEDURE: Right hip hemiarthroplasty, anterior approach.   IMPLANTS: DePuy AML stem, size 15 x 155 mm, small body, with a -3 mm spacer and a 42 mm monopolar head ball. Zimmer 1.8 mm adult reconstruction cable x1.  ANESTHESIA:  General  ANTIBIOTICS: 900 mg clindamycin.  ESTIMATED BLOOD LOSS:-450 mL    DRAINS: None.  COMPLICATIONS: Avulsion fracture greater trochanter   CONDITION: PACU - hemodynamically stable.   BRIEF CLINICAL NOTE: [name] [name] is a 83 y.o. female with a displaced Right femoral neck fracture. The patient was admitted to the hospitalist service and underwent perioperative risk stratification and medical optimization. The risks, benefits, and alternatives to hemiarthroplasty were explained, and the patient elected to proceed.  PROCEDURE IN DETAIL: The patient was taken to the operating room and general anesthesia was induced on the hospital bed. The patient was then positioned on the Hana table. All bony prominences were well padded. The hip was prepped and draped in the normal sterile surgical fashion. A time-out was called verifying side and site of surgery. Antibiotics were given within 60 minutes of beginning the procedure.  The direct anterior approach to the hip was performed through the Hueter interval. Lateral femoral circumflex vessels were treated with the Auqumantys. The anterior capsule was exposed and an inverted T capsulotomy was made. Fracture hematoma was encountered and evacuated. The patient was found to have a comminuted Right subcapital femoral neck fracture. I freshened the femoral neck cut with a saw. I removed the femoral neck fragment. A corkscrew was placed into the head and the head was removed. This was passed to the  back table and was measured.  Acetabular exposure was achieved. I examined the articular cartilage which was intact. The labrum was intact. A 42 mm trial head was placed and found to have excellent fit.  I then gained femoral exposure taking care to protect the abductors and greater trochanter. This was performed using standard external rotation, extension, and adduction. Her bone quality was very poor.  The tip of the greater trochanter avulsed.  Due to the concern for stability, I elected to proceed with the placement of AML stem.  First, I placed a single subperiosteal prophylactic cerclage cable just proximal to the lesser trochanter.  A cookie cutter was used to enter the femoral canal, and then the femoral canal finder was used to confirm location. I then sequentially reamed up to a 15 mm reamer with excellent cortical contact.  I then broached up to a size 15 small body stem. Calcar planer was used on the femoral neck remnant. I paced a trial neck and a 36+ 1.5 head ball.The hip was reduced. Leg lengths were checked fluoroscopically. The hip was dislocated and trial components were removed. I placed the real stem followed by the real spacer and head ball. A single reduction maneuver was performed and the hip was reduced. Fluoroscopy was used to confirm component position and leg lengths. At 90 degrees of external rotation and extension, the hip was stable to an anterior directed force.  The wound was copiously irrigated with normal saline solution. Marcaine solution was injected into the periarticular soft tissue. The wound was closed in layers using #1 Vicryl and V-Loc for the fascia, 2-0 Vicryl for the subcutaneous fat, 2-0 Monocryl for the deep dermal layer, 3-0 running Monocryl  subcuticular stitch and glue for the skin. Once the glue was fully dried, an Aquacell Ag dressing was applied. The patient was then awakened from anesthesia and transported to the recovery room in stable  condition. Sponge, needle, and instrument counts were correct at the end of the case x2. The patient tolerated the procedure well and there were no known complications.  Postoperatively, the patient will be readmitted to the hospitalist service.  She may weight-bear as tolerated with a walker.  No active abduction for 6 weeks.  She will work with physical and occupational therapy.  We will start aspirin for DVT prophylaxis.  She will undergo disposition planning.  I will see her back in the office 2 weeks after discharge for routine postoperative care.

## 2017-02-15 NOTE — Anesthesia Preprocedure Evaluation (Addendum)
Anesthesia Evaluation  Patient identified by MRN, date of birth, ID band Patient awake    Reviewed: Allergy & Precautions, NPO status , Patient's Chart, lab work & pertinent test results  Airway Mallampati: II  TM Distance: >3 FB Neck ROM: Limited    Dental  (+) Edentulous Upper, Edentulous Lower, Dental Advisory Given   Pulmonary neg pulmonary ROS,    Pulmonary exam normal        Cardiovascular hypertension, Pt. on medications  Rhythm:Regular Rate:Normal     Neuro/Psych  Neuromuscular disease CVA negative psych ROS   GI/Hepatic Neg liver ROS, GERD  Medicated,  Endo/Other  diabetes, Type 2  Renal/GU   negative genitourinary   Musculoskeletal  (+) Arthritis , Osteoarthritis,    Abdominal Normal abdominal exam  (+)   Peds  Hematology negative hematology ROS (+)   Anesthesia Other Findings   Reproductive/Obstetrics                           Lab Results  Component Value Date   WBC 6.8 02/14/2017   HGB 10.2 (L) 02/14/2017   HCT 32.4 (L) 02/14/2017   MCV 89.8 02/14/2017   PLT 158 02/14/2017   Lab Results  Component Value Date   CREATININE 1.21 (H) 02/14/2017   BUN 22 (H) 02/14/2017   NA 144 02/14/2017   K 3.8 02/14/2017   CL 104 02/14/2017   CO2 28 02/14/2017   Lab Results  Component Value Date   INR 1.36 03/09/2011   EKG: normal sinus rhythm.  Anesthesia Physical Anesthesia Plan  ASA: III  Anesthesia Plan: General   Post-op Pain Management:    Induction: Intravenous  PONV Risk Score and Plan: 4 or greater and Ondansetron, Dexamethasone and Treatment may vary due to age or medical condition  Airway Management Planned: Oral ETT  Additional Equipment: None  Intra-op Plan:   Post-operative Plan: Extubation in OR  Informed Consent: I have reviewed the patients History and Physical, chart, labs and discussed the procedure including the risks, benefits and alternatives  for the proposed anesthesia with the patient or authorized representative who has indicated his/her understanding and acceptance.   Dental advisory given  Plan Discussed with: CRNA, Anesthesiologist and Surgeon  Anesthesia Plan Comments:        Anesthesia Quick Evaluation

## 2017-02-15 NOTE — Clinical Social Work Note (Signed)
[facility] LCSW signing off.     Reanna Scoggin, Juleen ChinaHeather D, LCSW

## 2017-02-15 NOTE — Anesthesia Postprocedure Evaluation (Signed)
Anesthesia Post Note  Patient: [name] [name]  Procedure(s) Performed: ANTERIOR APPROACH HEMI HIP ARTHROPLASTY (Right Hip)     Patient location during evaluation: PACU Anesthesia Type: General Level of consciousness: awake Pain management: pain level controlled Vital Signs Assessment: post-procedure vital signs reviewed and stable Respiratory status: spontaneous breathing Cardiovascular status: stable Anesthetic complications: no    Last Vitals:  Vitals:   02/15/17 1000 02/15/17 1030  BP: (!) 123/49 (!) 118/57  Pulse:    Resp: 16 18  Temp:    SpO2:      Last Pain:  Vitals:   02/15/17 0218  TempSrc:   PainSc: 9                  Tod Abrahamsen

## 2017-02-15 NOTE — Anesthesia Procedure Notes (Signed)
Procedure Name: Intubation Date/Time: 02/15/2017 5:05 PM Performed by: Moshe Salisbury, CRNA Pre-anesthesia Checklist: Patient identified, Emergency Drugs available, Suction available and Patient being monitored Patient Re-evaluated:Patient Re-evaluated prior to induction Oxygen Delivery Method: Circle System Utilized Preoxygenation: Pre-oxygenation with 100% oxygen Induction Type: IV induction Ventilation: Mask ventilation without difficulty Laryngoscope Size: Mac and 3 Grade View: Grade I Tube type: Oral Tube size: 7.5 mm Number of attempts: 1 Airway Equipment and Method: Stylet and Oral airway Placement Confirmation: ETT inserted through vocal cords under direct vision,  positive ETCO2 and breath sounds checked- equal and bilateral Secured at: 19 cm Tube secured with: Tape Dental Injury: Teeth and Oropharynx as per pre-operative assessment

## 2017-02-15 NOTE — ED Notes (Signed)
Report given to Textron Inclicia,RNat [place] [facility]. Pt waiting on Transport via [facility]

## 2017-02-16 ENCOUNTER — Encounter (HOSPITAL_COMMUNITY): Payer: Self-pay | Admitting: *Deleted

## 2017-02-16 LAB — CBC
HCT: 24.3 % — ABNORMAL LOW (ref 36.0–46.0)
Hemoglobin: 7.8 g/dL — ABNORMAL LOW (ref 12.0–15.0)
MCH: 28.7 pg (ref 26.0–34.0)
MCHC: 32.1 g/dL (ref 30.0–36.0)
MCV: 89.3 fL (ref 78.0–100.0)
PLATELETS: 105 10*3/uL — AB (ref 150–400)
RBC: 2.72 MIL/uL — ABNORMAL LOW (ref 3.87–5.11)
RDW: 16.5 % — AB (ref 11.5–15.5)
WBC: 5.6 10*3/uL (ref 4.0–10.5)

## 2017-02-16 LAB — GLUCOSE, CAPILLARY: GLUCOSE-CAPILLARY: 163 mg/dL — AB (ref 65–99)

## 2017-02-16 LAB — BASIC METABOLIC PANEL
Anion gap: 12 (ref 5–15)
BUN: 16 mg/dL (ref 6–20)
CALCIUM: 8.5 mg/dL — AB (ref 8.9–10.3)
CO2: 23 mmol/L (ref 22–32)
CREATININE: 1.18 mg/dL — AB (ref 0.44–1.00)
Chloride: 108 mmol/L (ref 101–111)
GFR calc Af Amer: 43 mL/min — ABNORMAL LOW (ref >60)
GFR, EST NON AFRICAN AMERICAN: 37 mL/min — AB (ref >60)
Glucose, Bld: 173 mg/dL — ABNORMAL HIGH (ref 65–99)
Potassium: 4.1 mmol/L (ref 3.5–5.1)
SODIUM: 143 mmol/L (ref 135–145)

## 2017-02-16 NOTE — Progress Notes (Signed)
PROGRESS NOTE    [name] [name]  GNF:[id number] DOB: May 01, 1917 DOA: 02/14/2017 PCP: Reynolds Bowl, MD   Brief Narrative: [name] [name] is a 84 y.o. female with medical history significant of osteoarthritis, type 2 diabetes, dizziness, gout, gouty spondylitis, history of CVA, hypertension, back pain, sciatica. Patient presented with right hip fracture after a fall.   Assessment & Plan:   Principal Problem:   Closed right hip fracture, initial encounter (HCC) Active Problems:   HTN (hypertension)   Diabetes mellitus, type II (HCC)   Anemia   H/O: CVA (cardiovascular accident)   Atelectasis of left lung   Displaced fracture of right femoral neck (HCC)   Closed right hip fracture Patient is s/p right hip arthroplasty on 02/15/2017. -PT eval -Orthopedic surgery recommendations  Essential hypertension Normotensive  Anemia Acute blood loss anemia No bleeding. Hemoglobin down to 7.8.  -Repeat CBC in AM  Thrombocytopenia In setting of acute blood loss -Repeat CBC in AM  History of CVA -Continue atorvastatin, aspirin and Plavix  Left lung atelectasis    DVT prophylaxis: SCDs Code Status: Full code Family Communication: None at bedside Disposition Plan: Discharge pending PT recommendatiosn   Consultants:   Orthopedic surgery  Procedures:   Right hip arthroplasty (02/15/2017)  Antimicrobials:  None    Subjective: Pain improved. No other concerns  Objective: Vitals:   02/15/17 2023 02/15/17 2041 02/15/17 2252 02/16/17 0400  BP: 133/61 (!) 131/55 123/76 130/60  Pulse:  79  92  Resp:  16  12  Temp:  97.6 F (36.4 C) 97.8 F (36.6 C) 97.9 F (36.6 C)  TempSrc:  Axillary Oral Oral  SpO2:  100% 97% 100%  Weight:      Height:        Intake/Output Summary (Last 24 hours) at 02/16/2017 0843 Last data filed at 02/16/2017 0600 Gross per 24 hour  Intake 4218.33 ml  Output 1650 ml  Net 2568.33 ml   Filed Weights   02/14/17 2147  Weight: 58.1 kg  (128 lb)    Examination:  General exam: Appears calm and comfortable Respiratory system: Clear to auscultation. Respiratory effort normal. Cardiovascular system: S1 & S2 heard, RRR. No murmurs, rubs, gallops or clicks. Gastrointestinal system: Abdomen is nondistended, soft and nontender. No organomegaly or masses felt. Normal bowel sounds heard. Central nervous system: Alert and oriented to person and place. No focal neurological deficits. Extremities: No edema. No calf tenderness Skin: No cyanosis. No rashes Psychiatry: Judgement and insight appear normal. Mood & affect appropriate.     Data Reviewed: I have personally reviewed following labs and imaging studies  CBC: Recent Labs  Lab 02/14/17 2356 02/16/17 0436  WBC 6.8 5.6  NEUTROABS 5.5  --   HGB 10.2* 7.8*  HCT 32.4* 24.3*  MCV 89.8 89.3  PLT 158 PENDING   Basic Metabolic Panel: Recent Labs  Lab 02/14/17 2356 02/16/17 0436  NA 144 143  K 3.8 4.1  CL 104 108  CO2 28 23  GLUCOSE 152* 173*  BUN 22* 16  CREATININE 1.21* 1.18*  CALCIUM 9.0 8.5*   GFR: Estimated Creatinine Clearance: 20.6 mL/min (A) (by C-G formula based on SCr of 1.18 mg/dL (H)). Liver Function Tests: No results for input(s): AST, ALT, ALKPHOS, BILITOT, PROT, ALBUMIN in the last 168 hours. No results for input(s): LIPASE, AMYLASE in the last 168 hours. No results for input(s): AMMONIA in the last 168 hours. Coagulation Profile: No results for input(s): INR, PROTIME in the last 168 hours. Cardiac Enzymes:  No results for input(s): CKTOTAL, CKMB, CKMBINDEX, TROPONINI in the last 168 hours. BNP (last 3 results) No results for input(s): PROBNP in the last 8760 hours. HbA1C: No results for input(s): HGBA1C in the last 72 hours. CBG: Recent Labs  Lab 02/15/17 0625 02/15/17 1952 02/15/17 2356 02/16/17 0650  GLUCAP 136* 147* 163* 163*   Lipid Profile: No results for input(s): CHOL, HDL, LDLCALC, TRIG, CHOLHDL, LDLDIRECT in the last 72  hours. Thyroid Function Tests: No results for input(s): TSH, T4TOTAL, FREET4, T3FREE, THYROIDAB in the last 72 hours. Anemia Panel: No results for input(s): VITAMINB12, FOLATE, FERRITIN, TIBC, IRON, RETICCTPCT in the last 72 hours. Sepsis Labs: No results for input(s): PROCALCITON, LATICACIDVEN in the last 168 hours.  No results found for this or any previous visit (from the past 240 hour(s)).       Radiology Studies: Dg Chest 2 View  Result Date: 02/15/2017 CLINICAL DATA:  Right leg pain after a fall today. History of diabetes, hypertension, stroke EXAM: CHEST  2 VIEW COMPARISON:  03/09/2011 FINDINGS: Shallow inspiration. Heart size and pulmonary vascularity are normal for technique. Suggestion of infiltration or atelectasis in the left lung base behind the heart. No blunting of costophrenic angles. No pneumothorax. Calcification of the aorta. Degenerative changes in the spine and shoulders. Mediastinal contours appear intact. IMPRESSION: Infiltration or atelectasis in the left lung base behind the heart. Aortic atherosclerosis. Electronically Signed   By: Burman NievesWilliam  Stevens M.D.   On: 02/15/2017 00:09   Pelvis Portable  Result Date: 02/16/2017 CLINICAL DATA:  Right hip hemiarthroplasty. EXAM: PORTABLE PELVIS 1-2 VIEWS COMPARISON:  Intra op of fluoroscopy 02/15/2017 FINDINGS: Right hip hemiarthroplasty using non cemented component. Cerclage wire at the trochanteric region. Arthroplasty component appears well seated. No evidence of acute fracture or dislocation. Soft tissue gas consistent with recent surgery. Vascular calcifications. Incidental note of degenerative changes in the left hip. IMPRESSION: Postoperative right hip hemiarthroplasty. Component appears well seated. No acute complication is suggested. Electronically Signed   By: Burman NievesWilliam  Stevens M.D.   On: 02/16/2017 00:34   Dg Knee Complete 4 Views Right  Result Date: 02/14/2017 CLINICAL DATA:  Acute onset of right knee pain and  swelling. EXAM: RIGHT KNEE - COMPLETE 4+ VIEW COMPARISON:  None. FINDINGS: There is no evidence of fracture or dislocation. There is significant narrowing at the lateral compartment, with associated sclerosis. Cortical irregularity is noted at all 3 compartments. Prominent marginal osteophytes are seen at all 3 compartments, and along the tibial spine. Wall osteophytes are also seen. There is chronic flattening of the patella. Trace knee joint fluid remains within normal limits. Mild edema is noted at the level of the patellar tendon. IMPRESSION: 1. No evidence of fracture or dislocation. 2. Prominent tricompartmental osteoarthritis, most significant at the lateral compartment. Electronically Signed   By: Roanna RaiderJeffery  Chang M.D.   On: 02/14/2017 22:56   Dg C-arm 61-120 Min  Result Date: 02/15/2017 CLINICAL DATA:  Right hip arthroplasty. EXAM: OPERATIVE RIGHT HIP (WITH PELVIS IF PERFORMED) 2 VIEWS TECHNIQUE: Fluoroscopic spot image(s) were submitted for interpretation post-operatively. COMPARISON:  None. FINDINGS: 13 seconds of fluoroscopic time utilized for right bipolar uncemented hip arthroplasty with single cerclage wire noted. IMPRESSION: Fluoroscopic time utilized for right hip bipolar arthroplasty. No immediate intraoperative complications identified. Electronically Signed   By: Tollie Ethavid  Kwon M.D.   On: 02/15/2017 20:51   Dg Hip Operative Unilat W Or W/o Pelvis Right  Result Date: 02/15/2017 CLINICAL DATA:  Right hip arthroplasty. EXAM: OPERATIVE RIGHT HIP (WITH  PELVIS IF PERFORMED) 2 VIEWS TECHNIQUE: Fluoroscopic spot image(s) were submitted for interpretation post-operatively. COMPARISON:  None. FINDINGS: 13 seconds of fluoroscopic time utilized for right bipolar uncemented hip arthroplasty with single cerclage wire noted. IMPRESSION: Fluoroscopic time utilized for right hip bipolar arthroplasty. No immediate intraoperative complications identified. Electronically Signed   By: Tollie Eth M.D.   On:  02/15/2017 20:51   Dg Hip Unilat W Or Wo Pelvis 2-3 Views Right  Result Date: 02/15/2017 CLINICAL DATA:  Right leg pain after fall today. History of diabetes, hypertension, stroke. EXAM: DG HIP (WITH OR WITHOUT PELVIS) 2-3V RIGHT COMPARISON:  None. FINDINGS: Diffuse bone demineralization. Degenerative changes in the lower lumbar spine and hips. Focal cortical irregularity and slight lucency along the subcapital right femoral neck consistent with a mildly impacted fracture. No displacement or dislocation. Pelvis appears intact. SI joints and symphysis pubis are not displaced. Calcification adjacent to the pubic symphysis consistent with osteitis pubis. Vascular calcifications. IMPRESSION: Probable impacted transverse subcapital fracture of the right proximal femur. No displacement or dislocation. Electronically Signed   By: Burman Nieves M.D.   On: 02/15/2017 00:06   Dg Femur Min 2 Views Right  Result Date: 02/15/2017 CLINICAL DATA:  Right leg pain after a fall today. EXAM: RIGHT FEMUR 2 VIEWS COMPARISON:  None. FINDINGS: Diffuse bone demineralization. Cortical irregularity with vague linear lucency across a subcapital femoral neck consistent with nondisplaced impacted fracture. Femoral shaft appears intact. Severe degenerative changes in the right knee with lateral greater than medial compartment narrowing, osteophyte formation, and cartilaginous calcification. No significant effusion. Vascular calcifications in the soft tissues. IMPRESSION: Probable impacted subcapital fracture of the proximal right femur. Degenerative changes in the knee joint. No additional fractures identified. Electronically Signed   By: Burman Nieves M.D.   On: 02/15/2017 00:08        Scheduled Meds: . allopurinol  300 mg Oral QHS  . aspirin  81 mg Oral BID WC  . atorvastatin  20 mg Oral Daily  . clopidogrel  75 mg Oral Daily  . famotidine  20 mg Oral Daily  . fesoterodine  4 mg Oral Daily  . loratadine  10 mg  Oral Daily  . torsemide  10 mg Oral Daily   Continuous Infusions: . sodium chloride 100 mL/hr at 02/16/17 0541     LOS: 1 day     Jacquelin Hawking, MD [facility] 02/16/2017, 8:43 AM Pager: [phone]  If 7PM-7AM, please contact night-coverage [email or web address] Password [facility] 02/16/2017, 8:43 AM

## 2017-02-16 NOTE — Evaluation (Signed)
Occupational Therapy Evaluation Patient Details Name: [name] [name] S [place] MRN: [id number] DOB: 03-31-1917 Today's Date: 02/16/2017    History of Present Illness Pt is a 84 y/o female who presents after fall, sustaining a R femoral neck fracture. She is now s/p R hip hemiarthroplasty (anterior approach) and is WBAT - No active abduction allowed.   Clinical Impression   PTA, per pt report and chart review, she was independent with RW for basic ADL and functional mobility. However, she does not complete shower transfers at home and does wash-ups for bathing instead. She currently requires total assistance for toileting hygiene, max assist +2 for squat-pivot toilet transfers, and mod assist for dressing and bathing tasks. She was somewhat confused during session and easily distracted by her pain but very willing to participate and return to modified independence. Feel she is a good candidate for the intensity and duration of CIR level therapies to maximize return to independence. She will have 24 hour assistance from her son post-acute D/C. OT will continue to follow while admitted.     Follow Up Recommendations  CIR;Supervision/Assistance - 24 hour    Equipment Recommendations  Other (comment)(defer to next venue of care)    Recommendations for Other Services Rehab consult     Precautions / Restrictions Precautions Precautions: Fall;Anterior Hip Precaution Comments: Pt was educated on no active abduction - will need reinforcement of precautions. Restrictions Weight Bearing Restrictions: Yes RLE Weight Bearing: Weight bearing as tolerated Other Position/Activity Restrictions: NO active ABDuction      Mobility Bed Mobility Overal bed mobility: Needs Assistance Bed Mobility: Sit to Supine     Supine to sit: Mod assist;+2 for physical assistance;HOB elevated Sit to supine: Mod assist;+2 for physical assistance   General bed mobility comments: Assist to return B LE into bed.    Transfers Overall transfer level: Needs assistance Equipment used: Rolling walker (2 wheeled);2 person hand held assist Transfers: Squat Pivot Transfers Sit to Stand: Max assist;+2 physical assistance;From elevated surface   Squat pivot transfers: Max assist;+2 physical assistance     General transfer comment: Unable to utilize RW to come into full standing position. Utilized 2 person assist without RW and support under hips to squat-pivot from bed to chair.     Balance Overall balance assessment: Needs assistance Sitting-balance support: Feet supported;No upper extremity supported Sitting balance-Leahy Scale: Fair Sitting balance - Comments: At least 1 UE support at all times for sitting balance.    Standing balance support: Bilateral upper extremity supported;During functional activity Standing balance-Leahy Scale: Zero Standing balance comment: +2 required for standing balance.                            ADL either performed or assessed with clinical judgement   ADL Overall ADL's : Needs assistance/impaired Eating/Feeding: Set up;Sitting   Grooming: Supervision/safety;Sitting   Upper Body Bathing: Moderate assistance;Sitting   Lower Body Bathing: Moderate assistance;Sitting/lateral leans   Upper Body Dressing : Moderate assistance;Sitting   Lower Body Dressing: Moderate assistance;Sitting/lateral leans   Toilet Transfer: Maximal assistance;+2 for physical assistance;Squat-pivot   Toileting- Clothing Manipulation and Hygiene: Total assistance;Bed level       Functional mobility during ADLs: Maximal assistance(squat-pivot only) General ADL Comments: Pt very limited by pain      Vision   Additional Comments: Need to clarify. Pt not wearing glasses on eval and was able to track and make eye-contact. However, unable to fully assess as pt very distracted by  pain.      Perception     Praxis      Pertinent Vitals/Pain Pain Assessment: Faces Faces  Pain Scale: Hurts whole lot Pain Location: R hip and knee Pain Descriptors / Indicators: Operative site guarding;Grimacing;Aching Pain Intervention(s): Limited activity within patient's tolerance;Monitored during session;Repositioned     Hand Dominance     Extremity/Trunk Assessment Upper Extremity Assessment Upper Extremity Assessment: Overall WFL for tasks assessed   Lower Extremity Assessment Lower Extremity Assessment: RLE deficits/detail RLE Deficits / Details: Acute pain, decreased strength and AROM consistent with above mentioned procedure. Noted general weakness in LLE as well.  RLE: Unable to fully assess due to pain   Cervical / Trunk Assessment Cervical / Trunk Assessment: Kyphotic   Communication Communication Communication: Expressive difficulties(stuttering/slurring)   Cognition Arousal/Alertness: Awake/alert Behavior During Therapy: WFL for tasks assessed/performed Overall Cognitive Status: Difficult to assess                                 General Comments: Pt difficult to understand. She reports thinking her son had arrived to take her home today and did not remember that she had already had surgery. She was not able to remember her home phone number and demonstrated difficulty problem solving at times. Pt easily distracted by her pain.    General Comments       Exercises     Shoulder Instructions      Home Living Family/patient expects to be discharged to:: Private residence Living Arrangements: Children Available Help at Discharge: Family;Available 24 hours/day Type of Home: House Home Access: Ramped entrance     Home Layout: One level     Bathroom Shower/Tub: Chief Strategy Officer: Standard     Home Equipment: Environmental consultant - 2 wheels          Prior Functioning/Environment Level of Independence: Needs assistance  Gait / Transfers Assistance Needed: Difficult to determine but per PT pt was using RW at all times.   ADL's / Homemaking Assistance Needed: Pt very tangential and difficult to understand her answers to PLOF questions. Per physical therapist pt occasionally cooks but son mostly brings in food for her to heat up. She reports independence with bathing tasks but does wash-ups instead of stepping into her tub.             OT Problem List: Decreased strength;Decreased range of motion;Decreased activity tolerance;Impaired balance (sitting and/or standing);Decreased safety awareness;Decreased knowledge of use of DME or AE;Decreased knowledge of precautions;Pain      OT Treatment/Interventions: Self-care/ADL training;Therapeutic exercise;Energy conservation;DME and/or AE instruction;Therapeutic activities;Patient/family education;Balance training    OT Goals(Current goals can be found in the care plan section) Acute Rehab OT Goals Patient Stated Goal: Home at d/c OT Goal Formulation: With patient Time For Goal Achievement: 03/02/17 Potential to Achieve Goals: Good ADL Goals Pt Will Perform Grooming: with min assist;standing Pt Will Perform Lower Body Dressing: with min assist;sit to/from stand Pt Will Transfer to Toilet: with min assist;stand pivot transfer;bedside commode Pt Will Perform Toileting - Clothing Manipulation and hygiene: with min assist;sit to/from stand Additional ADL Goal #1: Pt will demonstrate improved short-term memory by completing delayed recall task with no more than 1 VC.  OT Frequency: Min 2X/week   Barriers to D/C:            Co-evaluation              AM-PAC PT "6  Clicks" Daily Activity     Outcome Measure Help from another person eating meals?: A Little Help from another person taking care of personal grooming?: A Little Help from another person toileting, which includes using toliet, bedpan, or urinal?: A Lot Help from another person bathing (including washing, rinsing, drying)?: A Lot Help from another person to put on and taking off regular upper body  clothing?: A Lot Help from another person to put on and taking off regular lower body clothing?: A Lot 6 Click Score: 14   End of Session Equipment Utilized During Treatment: Gait belt Nurse Communication: Mobility status  Activity Tolerance: Patient tolerated treatment well Patient left: with call bell/phone within reach;in bed;with bed alarm set  OT Visit Diagnosis: Other abnormalities of gait and mobility (R26.89);Pain Pain - Right/Left: Right Pain - part of body: Hip                Time: 1126-1210 OT Time Calculation (min): 44 min Charges:  OT General Charges $OT Visit: 1 Visit OT Evaluation $OT Eval Moderate Complexity: 1 Mod OT Treatments $Self Care/Home Management : 23-37 mins G-Codes:     Doristine Section, MS OTR/L  Pager: [phone]   Lynell Kussman A [place]ast[place] 02/16/2017, 12:46 PM

## 2017-02-16 NOTE — Progress Notes (Signed)
   Subjective: 1 Day Post-Op Procedure(s) (LRB): ANTERIOR APPROACH HEMI HIP ARTHROPLASTY (Right) Patient reports pain as mild.   Patient seen in rounds with Dr. Darrelyn HillockGioffre. Patient is having problems with urinary retention. 1200ml from in and out cath. No severe pain in regards to right hip.    Objective: Vital signs in last 24 hours: Temp:  [97.6 F (36.4 C)-97.9 F (36.6 C)] 97.9 F (36.6 C) (01/01 0400) Pulse Rate:  [58-92] 92 (01/01 0400) Resp:  [12-18] 12 (01/01 0400) BP: (114-133)/(47-76) 130/60 (01/01 0400) SpO2:  [97 %-100 %] 100 % (01/01 0400)  Intake/Output from previous day:  Intake/Output Summary (Last 24 hours) at 02/16/2017 0801 Last data filed at 02/16/2017 0600 Gross per 24 hour  Intake 4218.33 ml  Output 1650 ml  Net 2568.33 ml     Labs: Recent Labs    02/14/17 2356 02/16/17 0436  HGB 10.2* 7.8*   Recent Labs    02/14/17 2356 02/16/17 0436  WBC 6.8 5.6  RBC 3.61* 2.72*  HCT 32.4* 24.3*  PLT 158 PENDING   Recent Labs    02/14/17 2356 02/16/17 0436  NA 144 143  K 3.8 4.1  CL 104 108  CO2 28 23  BUN 22* 16  CREATININE 1.21* 1.18*  GLUCOSE 152* 173*  CALCIUM 9.0 8.5*    EXAM General - Patient is Alert and Oriented Extremity - Neurologically intact Intact pulses distally Dorsiflexion/Plantar flexion intact No cellulitis present Compartment soft Dressing/Incision - clean, dry, no drainage Motor Function - intact, moving foot and toes well on exam.   Past Medical History:  Diagnosis Date  . Arthritis   . Diabetes mellitus type II 02/19/2011  . Dizziness   . Gout   . Gout spondylitis   . 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 Hypertension   . Sciatica   . Stroke [facility](HCC)     Assessment/Plan: 1 Day Post-Op Procedure(s) (LRB): ANTERIOR APPROACH HEMI HIP ARTHROPLASTY (Right) Principal Problem:   Closed right hip fracture, initial encounter (HCC) Active Problems:   HTN (hypertension)   Diabetes  mellitus, type II (HCC)   Anemia   H/O: CVA (cardiovascular accident)   Atelectasis of left lung   Displaced fracture of right femoral neck (HCC)  Estimated body mass index is 26.75 kg/m as calculated from the following:   Height as of this encounter: 4\' 10"  (1.473 m).   Weight as of this encounter: 58.1 kg (128 lb). Advance diet Up with therapy  DVT Prophylaxis - Aspirin Weight-Bearing as tolerated with walker  She is doing well this morning. Possible DC tomorrow or Thursday depending on medical progress. Follow up with Dr. Linna CapriceSwinteck in 2 weeks. Will continue to watch voiding issues.   Dimitri PedAmber Demmi Sindt, PA-C Orthopaedic Surgery 02/16/2017, 8:01 AM

## 2017-02-16 NOTE — Evaluation (Signed)
Physical Therapy Evaluation Patient Details Name: [name] [name] MRN: [id number] DOB: 11-06-1917 Today's Date: 02/16/2017   History of Present Illness  Pt is a 84 y/o female who presents after fall, sustaining a R femoral neck fracture. She is now s/p R hip hemiarthroplasty (anterior approach) and is WBAT - No active abduction allowed.  Clinical Impression  Pt admitted with above diagnosis. Pt currently with functional limitations due to the deficits listed below (see PT Problem List). At the time of PT eval pt was very willing to work with therapy, and put forth a good rehab effort. +2 assist required for mobility at this time, and pain was the main limiting factor. Due to patient's advanced age and prior level of independence, recommending CIR consult to maximize functional independence for return home with son. Pt is very much wanting to return home, but at this time feel she would benefit most from continued therapy at a higher frequency, with the hope that the burden of care would be lessened for son to resume care. Acutely, pt will benefit from skilled PT to increase their independence and safety with mobility to allow discharge to the venue listed below.       Follow Up Recommendations CIR;Supervision/Assistance - 24 hour    Equipment Recommendations  None recommended by PT    Recommendations for Other Services       Precautions / Restrictions Precautions Precautions: Fall;Anterior Hip Precaution Comments: Pt was educated on no active abduction - will need reinforcement of precautions. Restrictions Weight Bearing Restrictions: Yes RLE Weight Bearing: Weight bearing as tolerated Other Position/Activity Restrictions: NO active ABDuction      Mobility  Bed Mobility Overal bed mobility: Needs Assistance Bed Mobility: Supine to Sit     Supine to sit: Mod assist;+2 for physical assistance;HOB elevated     General bed mobility comments: Assist for LE advancement towards EOB as  well as trunk elevation to full sitting position.   Transfers Overall transfer level: Needs assistance Equipment used: Rolling walker (2 wheeled);2 person hand held assist Transfers: Sit to/from [employer] Sit to Stand: Max assist;+2 physical assistance;From elevated surface         General transfer comment: Initially attempted with RW, however pt unable to achieve full upright posture. Pt then attempted with +2 assist with bed pad cradling under hips for added support. Squat pivot transfer to chair.  Ambulation/Gait             General Gait Details: Unable at this time.   Stairs            Wheelchair Mobility    Modified Rankin (Stroke Patients Only)       Balance Overall balance assessment: Needs assistance Sitting-balance support: Feet supported;No upper extremity supported Sitting balance-Leahy Scale: Fair Sitting balance - Comments: At least 1 UE support at all times for sitting balance.    Standing balance support: Bilateral upper extremity supported;During functional activity Standing balance-Leahy Scale: Zero Standing balance comment: +2 required for standing balance.                              Pertinent Vitals/Pain Pain Assessment: Faces Faces Pain Scale: Hurts whole lot Pain Location: R hip and knee Pain Descriptors / Indicators: Operative site guarding;Grimacing;Aching Pain Intervention(s): Limited activity within patient's tolerance;Monitored during session;Repositioned    Home Living Family/patient expects to be discharged to:: Private residence Living Arrangements: Children Available Help at Discharge: Family;Available 24  hours/day Type of Home: House Home Access: Ramped entrance     Home Layout: One level Home Equipment: Walker - 2 wheels      Prior Function Level of Independence: Needs assistance   Gait / Transfers Assistance Needed: Pt reports she uses a RW at all times.   ADL's / Homemaking  Assistance Needed: Difficult to understand, however it appears that the patient occasionally cooks, but son brings in food and she heats it up for meals.         Hand Dominance        Extremity/Trunk Assessment   Upper Extremity Assessment Upper Extremity Assessment: Defer to OT evaluation    Lower Extremity Assessment Lower Extremity Assessment: RLE deficits/detail RLE Deficits / Details: Acute pain, decreased strength and AROM consistent with above mentioned procedure. Noted general weakness in LLE as well.  RLE: Unable to fully assess due to pain    Cervical / Trunk Assessment Cervical / Trunk Assessment: Kyphotic  Communication   Communication: Expressive difficulties(stuttering/slurring of words)  Cognition Arousal/Alertness: Awake/alert Behavior During Therapy: WFL for tasks assessed/performed Overall Cognitive Status: Difficult to assess                                 General Comments: Pt very difficult to understand at times. When attempting to ask orientation questions pt appeared to get offended that therapist was asking but did not actually answer, so not sure how oriented pt was at the time of eval.       General Comments      Exercises     Assessment/Plan    PT Assessment Patient needs continued PT services  PT Problem List Decreased strength;Decreased range of motion;Decreased activity tolerance;Decreased balance;Decreased mobility;Decreased knowledge of use of DME;Decreased knowledge of precautions;Decreased safety awareness;Pain       PT Treatment Interventions DME instruction;Gait training;Stair training;Functional mobility training;Therapeutic activities;Therapeutic exercise;Neuromuscular re-education;Patient/family education    PT Goals (Current goals can be found in the Care Plan section)  Acute Rehab PT Goals Patient Stated Goal: Home at d/c PT Goal Formulation: With patient Time For Goal Achievement: 03/02/17 Potential to  Achieve Goals: Good    Frequency Min 3X/week   Barriers to discharge Decreased caregiver support Pt is requiring +2 assist for all aspects of mobility at this time.     Co-evaluation               AM-PAC PT "6 Clicks" Daily Activity  Outcome Measure Difficulty turning over in bed (including adjusting bedclothes, sheets and blankets)?: Unable Difficulty moving from lying on back to sitting on the side of the bed? : Unable Difficulty sitting down on and standing up from a chair with arms (e.g., wheelchair, bedside commode, etc,.)?: Unable Help needed moving to and from a bed to chair (including a wheelchair)?: A Lot Help needed walking in hospital room?: Total Help needed climbing 3-5 steps with a railing? : Total 6 Click Score: 7    End of Session Equipment Utilized During Treatment: Gait belt Activity Tolerance: Patient limited by pain Patient left: in chair;with call bell/phone within reach Nurse Communication: Mobility status;Other (comment)(Use of Stedy for back to bed) PT Visit Diagnosis: Unsteadiness on feet (R26.81);History of falling (Z91.81);Muscle weakness (generalized) (M62.81);Pain Pain - Right/Left: Right Pain - part of body: Hip    Time: [id number] PT Time Calculation (min) (ACUTE ONLY): 23 min   Charges:   PT Evaluation $PT Eval Moderate Complexity:  1 Mod PT Treatments $Gait Training: 8-22 mins   PT G Codes:        Conni Slipper, PT, DPT Acute Rehabilitation Services Pager: [phone]   Marylynn Pearson 02/16/2017, 10:47 AM

## 2017-02-16 NOTE — Progress Notes (Signed)
Spoke with Dr Caleb PoppNettey re PICC order.  States no need for PICC placement at this time. Dr. Caleb PoppNettey to cancel PICC order.

## 2017-02-16 NOTE — Plan of Care (Signed)
Discussed the importance of skin care even with the purewick with some teach back displayed.

## 2017-02-17 ENCOUNTER — Encounter (HOSPITAL_COMMUNITY): Payer: Self-pay | Admitting: Orthopedic Surgery

## 2017-02-17 DIAGNOSIS — Z8673 Personal history of transient ischemic attack (TIA), and cerebral infarction without residual deficits: Secondary | ICD-10-CM

## 2017-02-17 DIAGNOSIS — D696 Thrombocytopenia, unspecified: Secondary | ICD-10-CM

## 2017-02-17 DIAGNOSIS — R739 Hyperglycemia, unspecified: Secondary | ICD-10-CM

## 2017-02-17 DIAGNOSIS — G8918 Other acute postprocedural pain: Secondary | ICD-10-CM

## 2017-02-17 DIAGNOSIS — T380X5A Adverse effect of glucocorticoids and synthetic analogues, initial encounter: Secondary | ICD-10-CM

## 2017-02-17 DIAGNOSIS — R000 Tachycardia, unspecified: Secondary | ICD-10-CM

## 2017-02-17 DIAGNOSIS — D62 Acute posthemorrhagic anemia: Secondary | ICD-10-CM

## 2017-02-17 LAB — CBC
HEMATOCRIT: 20.2 % — AB (ref 36.0–46.0)
HEMOGLOBIN: 6.7 g/dL — AB (ref 12.0–15.0)
MCH: 28.6 pg (ref 26.0–34.0)
MCHC: 33.2 g/dL (ref 30.0–36.0)
MCV: 86.3 fL (ref 78.0–100.0)
Platelets: 113 10*3/uL — ABNORMAL LOW (ref 150–400)
RBC: 2.34 MIL/uL — AB (ref 3.87–5.11)
RDW: 16.5 % — ABNORMAL HIGH (ref 11.5–15.5)
WBC: 8 10*3/uL (ref 4.0–10.5)

## 2017-02-17 LAB — PREPARE RBC (CROSSMATCH)

## 2017-02-17 LAB — BASIC METABOLIC PANEL
Anion gap: 9 (ref 5–15)
BUN: 30 mg/dL — AB (ref 6–20)
CHLORIDE: 106 mmol/L (ref 101–111)
CO2: 24 mmol/L (ref 22–32)
Calcium: 8 mg/dL — ABNORMAL LOW (ref 8.9–10.3)
Creatinine, Ser: 1.78 mg/dL — ABNORMAL HIGH (ref 0.44–1.00)
GFR calc Af Amer: 26 mL/min — ABNORMAL LOW (ref >60)
GFR calc non Af Amer: 23 mL/min — ABNORMAL LOW (ref >60)
GLUCOSE: 130 mg/dL — AB (ref 65–99)
POTASSIUM: 4.6 mmol/L (ref 3.5–5.1)
SODIUM: 139 mmol/L (ref 135–145)

## 2017-02-17 LAB — GLUCOSE, CAPILLARY
GLUCOSE-CAPILLARY: 132 mg/dL — AB (ref 65–99)
Glucose-Capillary: 125 mg/dL — ABNORMAL HIGH (ref 65–99)
Glucose-Capillary: 121 mg/dL — ABNORMAL HIGH (ref 65–99)
Glucose-Capillary: 126 mg/dL — ABNORMAL HIGH (ref 65–99)

## 2017-02-17 LAB — HEMOGLOBIN AND HEMATOCRIT, BLOOD
HCT: 21.4 % — ABNORMAL LOW (ref 36.0–46.0)
HEMOGLOBIN: 6.9 g/dL — AB (ref 12.0–15.0)

## 2017-02-17 MED ORDER — ENSURE ENLIVE PO LIQD
237.0000 mL | Freq: Two times a day (BID) | ORAL | Status: DC
  Administered 2017-02-17 – 2017-02-18 (×3): 237 mL via ORAL

## 2017-02-17 MED ORDER — SODIUM CHLORIDE 0.9 % IV SOLN
Freq: Once | INTRAVENOUS | Status: AC
  Administered 2017-02-17: 19:00:00 via INTRAVENOUS

## 2017-02-17 MED ORDER — ASPIRIN 81 MG PO CHEW: 81.0000 mg | CHEWABLE_TABLET | Freq: Two times a day (BID) | ORAL | 1 refills | Status: AC

## 2017-02-17 MED ORDER — SODIUM CHLORIDE 0.9 % IV SOLN
Freq: Once | INTRAVENOUS | Status: AC
  Administered 2017-02-17: 12:00:00 via INTRAVENOUS

## 2017-02-17 MED ORDER — HYDROCODONE-ACETAMINOPHEN 5-325 MG PO TABS: 1.0000 | ORAL_TABLET | Freq: Four times a day (QID) | ORAL | 0 refills | Status: DC | PRN

## 2017-02-17 NOTE — Progress Notes (Signed)
Orthopedic Tech Progress Note Patient Details:  [name] [name] 03-17-1917 [id number]  Patient ID: [name] [name], female   DOB: 03-17-1917, 84 y.o.   MRN: [id number] Pt cant have ohf due to age restrictions  Trinna PostMartinez, Raylin Winer J 02/17/2017, 11:40 PM

## 2017-02-17 NOTE — Progress Notes (Signed)
RN noticed that patient's left arm/hand was swelling. Fluid was stopped and IV was removed. Hand is elevated and arm band has been placed on right wrist.

## 2017-02-17 NOTE — Discharge Instructions (Signed)
°Dr. Shakaya Bhullar °Joint Replacement Specialist °[facility] °[address] °[place],  [address] °[phone] ° ° °TOTAL HIP REPLACEMENT POSTOPERATIVE DIRECTIONS ° ° ° °Hip Rehabilitation, Guidelines Following Surgery  ° °WEIGHT BEARING °Weight bearing as tolerated with assist device (walker, cane, etc) as directed, use it as long as suggested by your surgeon or therapist, typically at least 4-6 weeks. ° °The results of a hip operation are greatly improved after range of motion and muscle strengthening exercises. Follow all safety measures which are given to protect your hip. If any of these exercises cause increased pain or swelling in your joint, decrease the amount until you are comfortable again. Then slowly increase the exercises. Call your caregiver if you have problems or questions.  ° °HOME CARE INSTRUCTIONS  °Most of the following instructions are designed to prevent the dislocation of your new hip.  °Remove items at home which could result in a fall. This includes throw rugs or furniture in walking pathways.  °Continue medications as instructed at time of discharge. °· You may have some home medications which will be placed on hold until you complete the course of blood thinner medication. °· You may start showering once you are discharged home. Do not remove your dressing. °Do not put on socks or shoes without following the instructions of your caregivers.   °Sit on chairs with arms. Use the chair arms to help push yourself up when arising.  °Arrange for the use of a toilet seat elevator so you are not sitting low.  °· Walk with walker as instructed.  °You may resume a sexual relationship in one month or when given the OK by your caregiver.  °Use walker as long as suggested by your caregivers.  °You may put full weight on your legs and walk as much as is comfortable. °Avoid periods of inactivity such as sitting longer than an hour when not asleep. This helps prevent  blood clots.  °You may return to work once you are cleared by your surgeon.  °Do not drive a car for 6 weeks or until released by your surgeon.  °Do not drive while taking narcotics.  °Wear elastic stockings for two weeks following surgery during the day but you may remove then at night.  °Make sure you keep all of your appointments after your operation with all of your doctors and caregivers. You should call the office at the above phone number and make an appointment for approximately two weeks after the date of your surgery. °Please pick up a stool softener and laxative for home use as long as you are requiring pain medications. °· ICE to the affected hip every three hours for 30 minutes at a time and then as needed for pain and swelling. Continue to use ice on the hip for pain and swelling from surgery. You may notice swelling that will progress down to the foot and ankle.  This is normal after surgery.  Elevate the leg when you are not up walking on it.   °It is important for you to complete the blood thinner medication as prescribed by your doctor. °· Continue to use the breathing machine which will help keep your temperature down.  It is common for your temperature to cycle up and down following surgery, especially at night when you are not up moving around and exerting yourself.  The breathing machine keeps your lungs expanded and your temperature down. ° °RANGE OF MOTION AND STRENGTHENING EXERCISES  °These exercises are   designed to help you keep full movement of your hip joint. Follow your caregiver's or physical therapist's instructions. Perform all exercises about fifteen times, three times per day or as directed. Exercise both hips, even if you have had only one joint replacement. These exercises can be done on a training (exercise) mat, on the floor, on a table or on a bed. Use whatever works the best and is most comfortable for you. Use music or television while you are exercising so that the exercises  are a pleasant break in your day. This will make your life better with the exercises acting as a break in routine you can look forward to.  Lying on your back, slowly slide your foot toward your buttocks, raising your knee up off the floor. Then slowly slide your foot back down until your leg is straight again.  Lying on your back, tighten up the muscle in the front of your thigh (quadriceps muscles). You can do this by keeping your leg straight and trying to raise your heel off the floor. This helps strengthen the largest muscle supporting your knee.  Lying on your back, tighten up the muscles of your buttocks both with the legs straight and with the knee bent at a comfortable angle while keeping your heel on the floor.   SKILLED REHAB INSTRUCTIONS: If the patient is transferred to a skilled rehab facility following release from the hospital, a list of the current medications will be sent to the facility for the patient to continue.  When discharged from the skilled rehab facility, please have the facility set up the patient's Home Health Physical Therapy prior to being released. Also, the skilled facility will be responsible for providing the patient with their medications at time of release from the facility to include their pain medication and their blood thinner medication. If the patient is still at the rehab facility at time of the two week follow up appointment, the skilled rehab facility will also need to assist the patient in arranging follow up appointment in our office and any transportation needs.  MAKE SURE YOU:  Understand these instructions.  Will watch your condition.  Will get help right away if you are not doing well or get worse.  Pick up stool softner and laxative for home use following surgery while on pain medications. Do not remove your dressing. The dressing is waterproof--it is OK to take showers. Continue to use ice for pain and swelling after surgery. Do not use any  lotions or creams on the incision until instructed by your surgeon. Total Hip Protocol. No active abduction for 6 weeks.

## 2017-02-17 NOTE — Consult Note (Signed)
Physical Medicine and Rehabilitation Consult Reason for Consult: Decreased functional mobility Referring Physician: [facility]   HPI: Tawni MillersUdell S Marschall is a 84 y.o. right handed female with history of diabetes mellitus, CVA, hypertension. Per chart review and familyu, patient lives with her son, who can only provide supervision at discharge. One level home with ramped entrance. Assistance is needed. Used a rolling walker at times prior to admission. Presented 02/15/2017 after mechanical fall landing on her right hip. Denied any loss of consciousness dizziness or chest pain. X-rays and imaging revealed displaced right femoral neck fracture. Underwent right hip hemiarthroplasty anterior approach 02/15/2017 per Dr. Linna CapriceSwinteck. Hospital course pain management. Weightbearing as tolerated. NO active abduction. Acute blood loss anemia 6.6 plan transfusion. Patient remains on Plavix as prior to hospital admission. Physical and occupational therapy evaluations completed with recommendations of physical medicine rehabilitation consult.  Review of Systems  Constitutional: Negative for chills and fever.  HENT: Positive for hearing loss.   Eyes: Negative for blurred vision and double vision.  Respiratory: Negative for cough and shortness of breath.   Cardiovascular: Negative for chest pain and palpitations.  Gastrointestinal: Positive for constipation. Negative for nausea and vomiting.  Genitourinary: Negative for dysuria, flank pain and hematuria.  Musculoskeletal: Positive for back pain, falls and myalgias.  Skin: Negative for rash.  Neurological: Positive for dizziness and weakness.  All other systems reviewed and are negative.  Past Medical History:  Diagnosis Date  . Arthritis   . Diabetes mellitus type II 02/19/2011  . Dizziness   . Gout   . Gout spondylitis   . 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 Hypertension   . Sciatica   . Stroke [facility](HCC)    Past  Surgical History:  Procedure Laterality Date  . ABDOMINAL HYSTERECTOMY    . HIP PINNING,CANNULATED Right 02/15/2017   Procedure: ANTERIOR APPROACH HEMI HIP ARTHROPLASTY;  Surgeon: Samson FredericSwinteck, Brian, MD;  Location: [facility] OR;  Service: Orthopedics;  Laterality: Right;   Family History  Problem Relation Age of Onset  . Stroke Mother   . Stroke Father    Social History:  reports that  has never smoked. she has never used smokeless tobacco. She reports that she does not drink alcohol or use drugs. Allergies:  Allergies  Allergen Reactions  . Bee Venom Swelling  . Penicillins Other (See Comments)    Has patient had a PCN reaction causing immediate rash, facial/tongue/throat swelling, SOB or lightheadedness with hypotension: Unknown Has patient had a PCN reaction causing severe rash involving mucus membranes or skin necrosis: Unknown Has patient had a PCN reaction that required hospitalization: Unknown Has patient had a PCN reaction occurring within the last 10 years: Unknown If all of the above answers are "NO", then may proceed with Cephalosporin use.    Medications Prior to Admission  Medication Sig Dispense Refill  . allopurinol (ZYLOPRIM) 100 MG tablet Take 300 mg by mouth at bedtime.      Marland Kitchen. aspirin EC 81 MG tablet Take 81 mg by mouth daily.    Marland Kitchen. atorvastatin (LIPITOR) 20 MG tablet Take 1 tablet by mouth daily.    . clopidogrel (PLAVIX) 75 MG tablet Take 75 mg by mouth daily.     . Coenzyme Q10 (COQ10 PO) Take 100 mg by mouth daily.    . colchicine 0.6 MG tablet Take 0.6 mg by mouth daily as needed. gout     . famotidine (PEPCID) 20 MG tablet Take 20 mg by  mouth daily.     Marland Kitchen loratadine (CLARITIN) 10 MG tablet Take 10 mg by mouth daily.    . meclizine (ANTIVERT) 25 MG tablet Take 25 mg by mouth 3 (three) times daily as needed. For nausea    . tolterodine (DETROL LA) 2 MG 24 hr capsule Take 4 mg by mouth daily.     Marland Kitchen torsemide (DEMADEX) 10 MG tablet Take 10 mg by mouth daily.        Home: Home Living Family/patient expects to be discharged to:: Private residence Living Arrangements: Children Available Help at Discharge: Family, Available 24 hours/day Type of Home: House Home Access: Ramped entrance Home Layout: One level Bathroom Shower/Tub: Engineer, manufacturing systems: Standard Home Equipment: Environmental consultant - 2 wheels  Functional History: Prior Function Level of Independence: Needs assistance Gait / Transfers Assistance Needed: Difficult to determine but per PT pt was using RW at all times.  ADL's / Homemaking Assistance Needed: Pt very tangential and difficult to understand her answers to PLOF questions. Per physical therapist pt occasionally cooks but son mostly brings in food for her to heat up. She reports independence with bathing tasks but does wash-ups instead of stepping into her tub.  Functional Status:  Mobility: Bed Mobility Overal bed mobility: Needs Assistance Bed Mobility: Sit to Supine Supine to sit: Mod assist, +2 for physical assistance, HOB elevated Sit to supine: Mod assist, +2 for physical assistance General bed mobility comments: Assist to return B LE into bed.  Transfers Overall transfer level: Needs assistance Equipment used: Rolling walker (2 wheeled), 2 person hand held assist Transfers: Squat Pivot Transfers Sit to Stand: Max assist, +2 physical assistance, From elevated surface Squat pivot transfers: Max assist, +2 physical assistance General transfer comment: Unable to utilize RW to come into full standing position. Utilized 2 person assist without RW and support under hips to squat-pivot from bed to chair.  Ambulation/Gait General Gait Details: Unable at this time.     ADL: ADL Overall ADL's : Needs assistance/impaired Eating/Feeding: Set up, Sitting Grooming: Supervision/safety, Sitting Upper Body Bathing: Moderate assistance, Sitting Lower Body Bathing: Moderate assistance, Sitting/lateral leans Upper Body Dressing :  Moderate assistance, Sitting Lower Body Dressing: Moderate assistance, Sitting/lateral leans Toilet Transfer: Maximal assistance, +2 for physical assistance, Squat-pivot Toileting- Clothing Manipulation and Hygiene: Total assistance, Bed level Functional mobility during ADLs: Maximal assistance(squat-pivot only) General ADL Comments: Pt very limited by pain   Cognition: Cognition Overall Cognitive Status: Difficult to assess Orientation Level: Oriented X4 Cognition Arousal/Alertness: Awake/alert Behavior During Therapy: WFL for tasks assessed/performed Overall Cognitive Status: Difficult to assess General Comments: Pt difficult to understand. She reports thinking her son had arrived to take her home today and did not remember that she had already had surgery. She was not able to remember her home phone number and demonstrated difficulty problem solving at times. Pt easily distracted by her pain.   Blood pressure (!) 135/51, pulse 84, temperature 97.8 F (36.6 C), temperature source Oral, resp. rate 18, height 4\' 10"  (1.473 m), weight 58.1 kg (128 lb), SpO2 92 %. Physical Exam  Vitals reviewed. Constitutional: She appears well-developed.  Frail  HENT:  Head: Normocephalic and atraumatic.  Eyes: EOM are normal. Left eye exhibits discharge.  Neck: Normal range of motion. Neck supple. No thyromegaly present.  Cardiovascular: Normal rate, regular rhythm and normal heart sounds.  Respiratory: Effort normal and breath sounds normal. No respiratory distress.  +Heflin  GI: Soft. Bowel sounds are normal. She exhibits no distension.  Musculoskeletal: She  exhibits edema (LE). She exhibits no tenderness.  Neurological: She is alert.  Patient is hard of hearing.  Make good eye contact with examiner. Motor: B/l UE 5/5 proximal to distal B/l LE:  2/5 proximal to distal (right weaker than left with some pain inhibition) Follows commands inconsistently with repitition  Skin:  Hip incision is  dressed. Appropriately tender  Psychiatric: Her affect is blunt. Her speech is delayed. She is slowed. She exhibits abnormal remote memory.  ?Dementia    Results for orders placed or performed during the hospital encounter of 02/14/17 (from the past 24 hour(s))  Glucose, capillary     Status: Abnormal   Collection Time: 02/17/17 12:20 AM  Result Value Ref Range   Glucose-Capillary 132 (H) 65 - 99 mg/dL   Comment 1 Document in Chart   CBC     Status: Abnormal   Collection Time: 02/17/17  4:52 AM  Result Value Ref Range   WBC 8.0 4.0 - 10.5 K/uL   RBC 2.34 (L) 3.87 - 5.11 MIL/uL   Hemoglobin 6.7 (LL) 12.0 - 15.0 g/dL   HCT 16.1 (L) 09.6 - 04.5 %   MCV 86.3 78.0 - 100.0 fL   MCH 28.6 26.0 - 34.0 pg   MCHC 33.2 30.0 - 36.0 g/dL   RDW 40.9 (H) 81.1 - 91.4 %   Platelets 113 (L) 150 - 400 K/uL  Basic metabolic panel     Status: Abnormal   Collection Time: 02/17/17  4:52 AM  Result Value Ref Range   Sodium 139 135 - 145 mmol/L   Potassium 4.6 3.5 - 5.1 mmol/L   Chloride 106 101 - 111 mmol/L   CO2 24 22 - 32 mmol/L   Glucose, Bld 130 (H) 65 - 99 mg/dL   BUN 30 (H) 6 - 20 mg/dL   Creatinine, Ser 7.82 (H) 0.44 - 1.00 mg/dL   Calcium 8.0 (L) 8.9 - 10.3 mg/dL   GFR calc non Af Amer 23 (L) >60 mL/min   GFR calc Af Amer 26 (L) >60 mL/min   Anion gap 9 5 - 15  Glucose, capillary     Status: Abnormal   Collection Time: 02/17/17  7:03 AM  Result Value Ref Range   Glucose-Capillary 125 (H) 65 - 99 mg/dL   Comment 1 Document in Chart   Hemoglobin and hematocrit, blood     Status: Abnormal   Collection Time: 02/17/17  9:15 AM  Result Value Ref Range   Hemoglobin 6.9 (LL) 12.0 - 15.0 g/dL   HCT 95.6 (L) 21.3 - 08.6 %  Prepare RBC     Status: None   Collection Time: 02/17/17 11:24 AM  Result Value Ref Range   Order Confirmation ORDER PROCESSED BY BLOOD BANK    Pelvis Portable  Result Date: 02/16/2017 CLINICAL DATA:  Right hip hemiarthroplasty. EXAM: PORTABLE PELVIS 1-2 VIEWS COMPARISON:   Intra op of fluoroscopy 02/15/2017 FINDINGS: Right hip hemiarthroplasty using non cemented component. Cerclage wire at the trochanteric region. Arthroplasty component appears well seated. No evidence of acute fracture or dislocation. Soft tissue gas consistent with recent surgery. Vascular calcifications. Incidental note of degenerative changes in the left hip. IMPRESSION: Postoperative right hip hemiarthroplasty. Component appears well seated. No acute complication is suggested. Electronically Signed   By: Burman Nieves M.D.   On: 02/16/2017 00:34    Assessment/Plan: Diagnosis: Right THA Labs independently reviewed.  Records reviewed and summated above. Oral pharmacological pain control  Bowel program: consider colace, miralax and/or Senna. PRN  suppository Fall precautions Monitor surgical wound and skin especially over pressure sensitive areas Prevent immobility complications: pressure ulcers, contractures, HO Consider modalities, such as TENs, CPM  1. Does the need for close, 24 hr/day medical supervision in concert with the patient's rehab needs make it unreasonable for this patient to be served in a less intensive setting? Yes  2. Co-Morbidities requiring supervision/potential complications: post-op pain management (Biofeedback training with therapies to help reduce reliance on opiate pain medications, monitor pain control during therapies, and sedation at rest and titrate to maximum efficacy to ensure participation and gains in therapies), acute blood loss anemia (transfuse again if necessary to ensure appropriate perfusion for increased activity tolerance), history of CVA, Tachycardia (monitor in accordance with pain and increasing activity), steroid induced hyperglycemia (Monitor in accordance with exercise and adjust meds as necessary), Thrombocytopenia (< 60,000/mm3 no resistive exercise), AKI on CKD (avoid nephrotoxic meds), ?dementia 3. Due to safety, skin/wound care, disease  management, pain management and patient education, does the patient require 24 hr/day rehab nursing? Yes 4. Does the patient require coordinated care of a physician, rehab nurse, PT (1-2 hrs/day, 5 days/week), OT (1-2 hrs/day, 5 days/week) and SLP (1-2 hrs/day, 5 days/week) to address physical and functional deficits in the context of the above medical diagnosis(es)? Yes Addressing deficits in the following areas: balance, endurance, locomotion, strength, transferring, bathing, dressing, toileting, cognition and psychosocial support 5. Can the patient actively participate in an intensive therapy program of at least 3 hrs of therapy per day at least 5 days per week? Potentially 6. The potential for patient to make measurable gains while on inpatient rehab is excellent 7. Anticipated functional outcomes upon discharge from inpatient rehab are min assist and mod assist  with PT, min assist and mod assist with OT, supervision with SLP. 8. Estimated rehab length of stay to reach the above functional goals is: 17-20 days. 9. Anticipated D/C setting: Other 10. Anticipated post D/C treatments: SNF 11. Overall Rehab/Functional Prognosis: good and fair  RECOMMENDATIONS: This patient's condition is appropriate for continued rehabilitative care in the following setting: CIR to decreases burden of care. Patient has agreed to participate in recommended program. Yes Note that insurance prior authorization may be required for reimbursement for recommended care.  Comment: Rehab Admissions Coordinator to follow up.  Maryla Morrow, MD, ABPMR Mcarthur Rossetti Angiulli, PA-C 02/17/2017

## 2017-02-17 NOTE — Progress Notes (Signed)
Patient has not urinated on this shift- bladder scan revealed 321cc. Np notified- awaiting any further orders

## 2017-02-17 NOTE — Plan of Care (Signed)
  Progressing Education: Knowledge of General Education information will improve 02/17/2017 1422 - Progressing by Darreld Mcleanox, Yared Barefoot, RN Health Behavior/Discharge Planning: Ability to manage health-related needs will improve 02/17/2017 1422 - Progressing by Darreld Mcleanox, Michaeljames Milnes, RN Elimination: Will not experience complications related to bowel motility 02/17/2017 1422 - Progressing by Darreld Mcleanox, Adeoluwa Silvers, RN Will not experience complications related to urinary retention 02/17/2017 1422 - Progressing by Darreld Mcleanox, Baran Kuhrt, RN Pain Managment: General experience of comfort will improve 02/17/2017 1422 - Progressing by Darreld Mcleanox, Ameya Vowell, RN Safety: Ability to remain free from injury will improve 02/17/2017 1422 - Progressing by Darreld Mcleanox, Aubree Doody, RN Skin Integrity: Risk for impaired skin integrity will decrease 02/17/2017 1422 - Progressing by Darreld Mcleanox, Henslee Lottman, RN Education: Verbalization of understanding the information provided (i.e., activity precautions, restrictions, etc) will improve 02/17/2017 1422 - Progressing by Darreld Mcleanox, Jamielyn Petrucci, RN Activity: Ability to ambulate and perform ADLs will improve 02/17/2017 1422 - Progressing by Darreld Mcleanox, Ellana Kawa, RN Clinical Measurements: Postoperative complications will be avoided or minimized 02/17/2017 1422 - Progressing by Darreld Mcleanox, Jernard Reiber, RN Self-Concept: Ability to maintain and perform role responsibilities to the fullest extent possible will improve 02/17/2017 1422 - Progressing by Darreld Mcleanox, Karson Chicas, RN Pain Management: Pain level will decrease 02/17/2017 1422 - Progressing by Darreld Mcleanox, Maleiah Dula, RN

## 2017-02-17 NOTE — Progress Notes (Signed)
PROGRESS NOTE    Tawni MillersUdell S Haberkorn  ZOX:[id number] DOB: 08-16-1917 DOA: 02/14/2017 PCP: Reynolds Bowlomstock, Lloyd, MD   Brief Narrative: [name] [name] is a 84 y.o. female with medical history significant of osteoarthritis, type 2 diabetes, dizziness, gout, gouty spondylitis, history of CVA, hypertension, back pain, sciatica. Patient presented with right hip fracture after a fall.   Assessment & Plan:   Principal Problem:   Closed right hip fracture, initial encounter (HCC) Active Problems:   HTN (hypertension)   Diabetes mellitus, type II (HCC)   Anemia   H/O: CVA (cardiovascular accident)   Atelectasis of left lung   Displaced fracture of right femoral neck (HCC)   Closed right hip fracture Patient is s/p right hip arthroplasty on 02/15/2017. -PT recommendations: CIR -Orthopedic surgery recommendations  Essential hypertension Normotensive  Anemia Acute blood loss anemia No bleeding. Hemoglobin now down to 6.7 with mild improvement to 6.9 on repeat. -2 units PRBC -post transfusion H&H -Repeat CBC in AM  Thrombocytopenia In setting of acute blood loss. Stable.  History of CVA -Continue atorvastatin, aspirin and Plavix  Left lung atelectasis -Incentive spirometer  Acute kidney injury Likely secondary to acute blood loss and hypovolemia. IV fluids were infiltrating into patient's arm -hold IV fluids -Give PRBC as above    DVT prophylaxis: SCDs Code Status: Full code Family Communication: None at bedside Disposition Plan: Discharge pending PT recommendatiosn   Consultants:   Orthopedic surgery  Procedures:   Right hip arthroplasty (02/15/2017)  Antimicrobials:  None    Subjective: No pain.  Objective: Vitals:   02/16/17 0400 02/16/17 1459 02/16/17 2024 02/17/17 0658  BP: 130/60 (!) 119/39 (!) 125/41 (!) 135/51  Pulse: 92 (!) 58 93 84  Resp: 12 18 18    Temp: 97.9 F (36.6 C) 98.5 F (36.9 C) 98.4 F (36.9 C) 97.8 F (36.6 C)  TempSrc: Oral Oral  Oral Oral  SpO2: 100% 94% 95% 92%  Weight:      Height:        Intake/Output Summary (Last 24 hours) at 02/17/2017 1124 Last data filed at 02/17/2017 0700 Gross per 24 hour  Intake 680 ml  Output 450 ml  Net 230 ml   Filed Weights   02/14/17 2147  Weight: 58.1 kg (128 lb)    Examination:  General exam: Appears calm and comfortable Respiratory system: Clear to auscultation. Respiratory effort normal. Cardiovascular system: S1 & S2 heard, RRR. No murmurs, rubs, gallops or clicks. Gastrointestinal system: Abdomen is nondistended, soft and nontender. No organomegaly or masses felt. Normal bowel sounds heard. Central nervous system: Alert and oriented to person and place. No focal neurological deficits. Extremities: No edema. No calf tenderness Skin: No cyanosis. No rashes Psychiatry: Judgement and insight appear normal. Mood & affect appropriate.     Data Reviewed: I have personally reviewed following labs and imaging studies  CBC: Recent Labs  Lab 02/14/17 2356 02/16/17 0436 02/17/17 0452 02/17/17 0915  WBC 6.8 5.6 8.0  --   NEUTROABS 5.5  --   --   --   HGB 10.2* 7.8* 6.7* 6.9*  HCT 32.4* 24.3* 20.2* 21.4*  MCV 89.8 89.3 86.3  --   PLT 158 105* 113*  --    Basic Metabolic Panel: Recent Labs  Lab 02/14/17 2356 02/16/17 0436 02/17/17 0452  NA 144 143 139  K 3.8 4.1 4.6  CL 104 108 106  CO2 28 23 24   GLUCOSE 152* 173* 130*  BUN 22* 16 30*  CREATININE 1.21* 1.18* 1.78*  CALCIUM 9.0 8.5* 8.0*   GFR: Estimated Creatinine Clearance: 13.6 mL/min (A) (by C-G formula based on SCr of 1.78 mg/dL (H)). Liver Function Tests: No results for input(s): AST, ALT, ALKPHOS, BILITOT, PROT, ALBUMIN in the last 168 hours. No results for input(s): LIPASE, AMYLASE in the last 168 hours. No results for input(s): AMMONIA in the last 168 hours. Coagulation Profile: No results for input(s): INR, PROTIME in the last 168 hours. Cardiac Enzymes: No results for input(s): CKTOTAL,  CKMB, CKMBINDEX, TROPONINI in the last 168 hours. BNP (last 3 results) No results for input(s): PROBNP in the last 8760 hours. HbA1C: No results for input(s): HGBA1C in the last 72 hours. CBG: Recent Labs  Lab 02/15/17 1952 02/15/17 2356 02/16/17 0650 02/17/17 0020 02/17/17 0703  GLUCAP 147* 163* 163* 132* 125*   Lipid Profile: No results for input(s): CHOL, HDL, LDLCALC, TRIG, CHOLHDL, LDLDIRECT in the last 72 hours. Thyroid Function Tests: No results for input(s): TSH, T4TOTAL, FREET4, T3FREE, THYROIDAB in the last 72 hours. Anemia Panel: No results for input(s): VITAMINB12, FOLATE, FERRITIN, TIBC, IRON, RETICCTPCT in the last 72 hours. Sepsis Labs: No results for input(s): PROCALCITON, LATICACIDVEN in the last 168 hours.  No results found for this or any previous visit (from the past 240 hour(s)).       Radiology Studies: Pelvis Portable  Result Date: 02/16/2017 CLINICAL DATA:  Right hip hemiarthroplasty. EXAM: PORTABLE PELVIS 1-2 VIEWS COMPARISON:  Intra op of fluoroscopy 02/15/2017 FINDINGS: Right hip hemiarthroplasty using non cemented component. Cerclage wire at the trochanteric region. Arthroplasty component appears well seated. No evidence of acute fracture or dislocation. Soft tissue gas consistent with recent surgery. Vascular calcifications. Incidental note of degenerative changes in the left hip. IMPRESSION: Postoperative right hip hemiarthroplasty. Component appears well seated. No acute complication is suggested. Electronically Signed   By: Burman Nieves M.D.   On: 02/16/2017 00:34        Scheduled Meds: . allopurinol  300 mg Oral QHS  . aspirin  81 mg Oral BID WC  . atorvastatin  20 mg Oral Daily  . clopidogrel  75 mg Oral Daily  . famotidine  20 mg Oral Daily  . fesoterodine  4 mg Oral Daily  . loratadine  10 mg Oral Daily  . torsemide  10 mg Oral Daily   Continuous Infusions: . sodium chloride    . sodium chloride       LOS: 2 days      Jacquelin Hawking, MD [facility] 02/17/2017, 11:24 AM Pager: [phone]  If 7PM-7AM, please contact night-coverage [email or web address] Password [facility] 02/17/2017, 11:24 AM

## 2017-02-17 NOTE — Progress Notes (Signed)
Initial Nutrition Assessment  DOCUMENTATION CODES:   Not applicable  INTERVENTION:    Ensure Enlive po BID, each supplement provides 350 kcal and 20 grams of protein  NUTRITION DIAGNOSIS:   Increased nutrient needs related to hip fracture as evidenced by estimated needs  GOAL:   Patient will meet greater than or equal to 90% of their needs  MONITOR:   PO intake, Supplement acceptance, Labs, Weight trends, Skin  REASON FOR ASSESSMENT:   Consult Assessment of nutrition requirement/status  ASSESSMENT:   84 y.o. female with medical history significant of osteoarthritis, type 2 diabetes, dizziness, gout, gouty spondylitis, history of CVA, hypertension, back pain, sciatica. Patient presented with right hip fracture after a fall.  Pt is s/p right hip arthroplasty on 12/31. Several family members visiting. Did not disturb. PO intake 50% per flowsheet records. Labs and medications reviewed. CBG's [id number].  Unable to complete Nutrition-Focused physical exam at this time.   Diet Order:  Diet Heart Room service appropriate? Yes; Fluid consistency: Thin  EDUCATION NEEDS:   No education needs have been identified at this time  Skin:  Skin Assessment: Reviewed RN Assessment  Last BM:  PTA  Height:   Ht Readings from Last 1 Encounters:  02/14/17 4\' 10"  (1.473 m)   Weight:   Wt Readings from Last 1 Encounters:  02/14/17 128 lb (58.1 kg)   Ideal Body Weight:  44 kg  BMI:  Body mass index is 26.75 kg/m.  Estimated Nutritional Needs:   Kcal:  1400-1600  Protein:  70-85 gm  Fluid:  >/= 1.5 L  Maureen ChattersKatie Elverda Wendel, RD, LDN Pager #: [phone] After-Hours Pager #: [phone]

## 2017-02-17 NOTE — Progress Notes (Signed)
   Subjective:  Patient reports pain as mild to moderate.  No c/o.  Objective:   VITALS:   Vitals:   02/16/17 0400 02/16/17 1459 02/16/17 2024 02/17/17 0658  BP: 130/60 (!) 119/39 (!) 125/41 (!) 135/51  Pulse: 92 (!) 58 93 84  Resp: 12 18 18    Temp: 97.9 F (36.6 C) 98.5 F (36.9 C) 98.4 F (36.9 C) 97.8 F (36.6 C)  TempSrc: Oral Oral Oral Oral  SpO2: 100% 94% 95% 92%  Weight:      Height:       NAD ABD soft Sensation intact distally Intact pulses distally Dorsiflexion/Plantar flexion intact Incision: dressing C/D/I Compartment soft   Lab Results  Component Value Date   WBC 5.6 02/16/2017   HGB 7.8 (L) 02/16/2017   HCT 24.3 (L) 02/16/2017   MCV 89.3 02/16/2017   PLT 105 (L) 02/16/2017   BMET    Component Value Date/Time   NA 139 02/17/2017 0452   K 4.6 02/17/2017 0452   CL 106 02/17/2017 0452   CO2 24 02/17/2017 0452   GLUCOSE 130 (H) 02/17/2017 0452   BUN 30 (H) 02/17/2017 0452   CREATININE 1.78 (H) 02/17/2017 0452   CALCIUM 8.0 (L) 02/17/2017 0452   GFRNONAA 23 (L) 02/17/2017 0452   GFRAA 26 (L) 02/17/2017 0452     Assessment/Plan: 2 Days Post-Op   Principal Problem:   Closed right hip fracture, initial encounter (HCC) Active Problems:   HTN (hypertension)   Diabetes mellitus, type II (HCC)   Anemia   H/O: CVA (cardiovascular accident)   Atelectasis of left lung   Displaced fracture of right femoral neck (HCC)   WBAT with walker No active abduction x 6 weeks DVT ppx: ASA + plavix, SCDs, TEDs PO pain control PT/OT D/C planning   Iline OvenBrian J Maguire Killmer 02/17/2017, 7:34 AM   Samson FredericBrian Cystal Shannahan, MD Cell [phone]

## 2017-02-17 NOTE — Progress Notes (Signed)
Inpatient Rehabilitation  Per PT/OT request, patient was screened by Akito Boomhower for appropriateness for an Inpatient Acute Rehab consult.  At this time we are recommending an Inpatient Rehab consult.  Text paged MD to notify; please order if you are agreeable.   Jermarion Poffenberger, M.A., CCC/SLP Admission Coordinator  [facility]  Cell [phone]   

## 2017-02-17 NOTE — Progress Notes (Signed)
CRITICAL VALUE ALERT  Critical Value:  6.7 hgb  Date & Time Notied:  8:00am  Provider Notified: MD Nettey  Orders Received/Actions taken: Awaiting new orders. Day RN aware

## 2017-02-18 ENCOUNTER — Inpatient Hospital Stay (HOSPITAL_COMMUNITY)
Admission: RE | Admit: 2017-02-18 | Discharge: 2017-03-02 | DRG: 560 | Disposition: A | Discharge status: Skilled Nursing Facility | Payer: Medicare Other | Source: Intra-hospital | Attending: Physical Medicine & Rehabilitation | Admitting: Physical Medicine & Rehabilitation

## 2017-02-18 ENCOUNTER — Encounter (HOSPITAL_COMMUNITY): Payer: Self-pay | Admitting: *Deleted

## 2017-02-18 ENCOUNTER — Other Ambulatory Visit: Payer: Self-pay

## 2017-02-18 DIAGNOSIS — E1122 Type 2 diabetes mellitus with diabetic chronic kidney disease: Secondary | ICD-10-CM | POA: Diagnosis present

## 2017-02-18 DIAGNOSIS — N390 Urinary tract infection, site not specified: Secondary | ICD-10-CM | POA: Diagnosis present

## 2017-02-18 DIAGNOSIS — Z7982 Long term (current) use of aspirin: Secondary | ICD-10-CM

## 2017-02-18 DIAGNOSIS — I129 Hypertensive chronic kidney disease with stage 1 through stage 4 chronic kidney disease, or unspecified chronic kidney disease: Secondary | ICD-10-CM | POA: Diagnosis present

## 2017-02-18 DIAGNOSIS — M1711 Unilateral primary osteoarthritis, right knee: Secondary | ICD-10-CM | POA: Diagnosis present

## 2017-02-18 DIAGNOSIS — S72011D Unspecified intracapsular fracture of right femur, subsequent encounter for closed fracture with routine healing: Principal | ICD-10-CM

## 2017-02-18 DIAGNOSIS — N183 Chronic kidney disease, stage 3 unspecified: Secondary | ICD-10-CM

## 2017-02-18 DIAGNOSIS — S72001S Fracture of unspecified part of neck of right femur, sequela: Secondary | ICD-10-CM | POA: Diagnosis not present

## 2017-02-18 DIAGNOSIS — H9193 Unspecified hearing loss, bilateral: Secondary | ICD-10-CM

## 2017-02-18 DIAGNOSIS — K5900 Constipation, unspecified: Secondary | ICD-10-CM | POA: Diagnosis present

## 2017-02-18 DIAGNOSIS — Z823 Family history of stroke: Secondary | ICD-10-CM | POA: Diagnosis not present

## 2017-02-18 DIAGNOSIS — K5901 Slow transit constipation: Secondary | ICD-10-CM

## 2017-02-18 DIAGNOSIS — S72001A Fracture of unspecified part of neck of right femur, initial encounter for closed fracture: Secondary | ICD-10-CM

## 2017-02-18 DIAGNOSIS — R2689 Other abnormalities of gait and mobility: Secondary | ICD-10-CM

## 2017-02-18 DIAGNOSIS — M25561 Pain in right knee: Secondary | ICD-10-CM | POA: Diagnosis present

## 2017-02-18 DIAGNOSIS — S72001D Fracture of unspecified part of neck of right femur, subsequent encounter for closed fracture with routine healing: Secondary | ICD-10-CM | POA: Diagnosis not present

## 2017-02-18 DIAGNOSIS — E785 Hyperlipidemia, unspecified: Secondary | ICD-10-CM | POA: Diagnosis present

## 2017-02-18 DIAGNOSIS — N3281 Overactive bladder: Secondary | ICD-10-CM | POA: Diagnosis present

## 2017-02-18 DIAGNOSIS — R42 Dizziness and giddiness: Secondary | ICD-10-CM | POA: Diagnosis present

## 2017-02-18 DIAGNOSIS — B9620 Unspecified Escherichia coli [E. coli] as the cause of diseases classified elsewhere: Secondary | ICD-10-CM | POA: Diagnosis not present

## 2017-02-18 DIAGNOSIS — Z96641 Presence of right artificial hip joint: Secondary | ICD-10-CM | POA: Diagnosis present

## 2017-02-18 DIAGNOSIS — Z7902 Long term (current) use of antithrombotics/antiplatelets: Secondary | ICD-10-CM

## 2017-02-18 DIAGNOSIS — R451 Restlessness and agitation: Secondary | ICD-10-CM | POA: Diagnosis present

## 2017-02-18 DIAGNOSIS — D62 Acute posthemorrhagic anemia: Secondary | ICD-10-CM | POA: Diagnosis present

## 2017-02-18 DIAGNOSIS — S72009A Fracture of unspecified part of neck of unspecified femur, initial encounter for closed fracture: Secondary | ICD-10-CM | POA: Diagnosis present

## 2017-02-18 DIAGNOSIS — M7989 Other specified soft tissue disorders: Secondary | ICD-10-CM | POA: Diagnosis not present

## 2017-02-18 DIAGNOSIS — I10 Essential (primary) hypertension: Secondary | ICD-10-CM

## 2017-02-18 DIAGNOSIS — M109 Gout, unspecified: Secondary | ICD-10-CM | POA: Diagnosis present

## 2017-02-18 DIAGNOSIS — E119 Type 2 diabetes mellitus without complications: Secondary | ICD-10-CM

## 2017-02-18 DIAGNOSIS — Z8673 Personal history of transient ischemic attack (TIA), and cerebral infarction without residual deficits: Secondary | ICD-10-CM

## 2017-02-18 DIAGNOSIS — R0989 Other specified symptoms and signs involving the circulatory and respiratory systems: Secondary | ICD-10-CM | POA: Diagnosis not present

## 2017-02-18 DIAGNOSIS — W1830XD Fall on same level, unspecified, subsequent encounter: Secondary | ICD-10-CM | POA: Diagnosis not present

## 2017-02-18 DIAGNOSIS — F05 Delirium due to known physiological condition: Secondary | ICD-10-CM | POA: Diagnosis not present

## 2017-02-18 LAB — TYPE AND SCREEN
ABO/RH(D): O POS
ANTIBODY SCREEN: NEGATIVE
UNIT DIVISION: 0
UNIT DIVISION: 0

## 2017-02-18 LAB — CBC
HCT: 31.6 % — ABNORMAL LOW (ref 36.0–46.0)
Hemoglobin: 10.5 g/dL — ABNORMAL LOW (ref 12.0–15.0)
MCH: 29.2 pg (ref 26.0–34.0)
MCHC: 33.2 g/dL (ref 30.0–36.0)
MCV: 87.8 fL (ref 78.0–100.0)
PLATELETS: 115 10*3/uL — AB (ref 150–400)
RBC: 3.6 MIL/uL — ABNORMAL LOW (ref 3.87–5.11)
RDW: 16.3 % — ABNORMAL HIGH (ref 11.5–15.5)
WBC: 8 10*3/uL (ref 4.0–10.5)

## 2017-02-18 LAB — BPAM RBC
Blood Product Expiration Date: 201901312359
Blood Product Expiration Date: 201901312359
ISSUE DATE / TIME: 201901021700
ISSUE DATE / TIME: 201901021342
UNIT TYPE AND RH: 5100
Unit Type and Rh: 5100

## 2017-02-18 LAB — GLUCOSE, CAPILLARY
GLUCOSE-CAPILLARY: 113 mg/dL — AB (ref 65–99)
Glucose-Capillary: 123 mg/dL — ABNORMAL HIGH (ref 65–99)
Glucose-Capillary: 145 mg/dL — ABNORMAL HIGH (ref 65–99)
Glucose-Capillary: 171 mg/dL — ABNORMAL HIGH (ref 65–99)

## 2017-02-18 LAB — BASIC METABOLIC PANEL
Anion gap: 8 (ref 5–15)
BUN: 31 mg/dL — AB (ref 6–20)
CO2: 21 mmol/L — ABNORMAL LOW (ref 22–32)
CREATININE: 1.47 mg/dL — AB (ref 0.44–1.00)
Calcium: 8.1 mg/dL — ABNORMAL LOW (ref 8.9–10.3)
Chloride: 112 mmol/L — ABNORMAL HIGH (ref 101–111)
GFR, EST AFRICAN AMERICAN: 33 mL/min — AB (ref >60)
GFR, EST NON AFRICAN AMERICAN: 29 mL/min — AB (ref >60)
Glucose, Bld: 152 mg/dL — ABNORMAL HIGH (ref 65–99)
Potassium: 4.7 mmol/L (ref 3.5–5.1)
SODIUM: 141 mmol/L (ref 135–145)

## 2017-02-18 MED ORDER — FESOTERODINE FUMARATE ER 4 MG PO TB24
4.0000 mg | ORAL_TABLET | Freq: Every day | ORAL | Status: DC
  Administered 2017-02-19 – 2017-03-02 (×12): 4 mg via ORAL
  Filled 2017-02-18 (×12): qty 1

## 2017-02-18 MED ORDER — FAMOTIDINE 20 MG PO TABS
20.0000 mg | ORAL_TABLET | Freq: Every day | ORAL | Status: DC
  Administered 2017-02-19 – 2017-03-02 (×12): 20 mg via ORAL
  Filled 2017-02-18 (×12): qty 1

## 2017-02-18 MED ORDER — CLOPIDOGREL BISULFATE 75 MG PO TABS
75.0000 mg | ORAL_TABLET | Freq: Every day | ORAL | Status: DC
  Administered 2017-02-19 – 2017-03-02 (×12): 75 mg via ORAL
  Filled 2017-02-18 (×12): qty 1

## 2017-02-18 MED ORDER — SENNOSIDES-DOCUSATE SODIUM 8.6-50 MG PO TABS
2.0000 | ORAL_TABLET | Freq: Two times a day (BID) | ORAL | Status: DC
  Administered 2017-02-18 – 2017-03-01 (×21): 2 via ORAL
  Filled 2017-02-18 (×23): qty 2

## 2017-02-18 MED ORDER — ACETAMINOPHEN 650 MG RE SUPP: 650.0000 mg | Freq: Four times a day (QID) | RECTAL | Status: DC | PRN

## 2017-02-18 MED ORDER — ENSURE ENLIVE PO LIQD
237.0000 mL | Freq: Two times a day (BID) | ORAL | Status: DC
  Administered 2017-02-19 – 2017-03-02 (×17): 237 mL via ORAL

## 2017-02-18 MED ORDER — ONDANSETRON HCL 4 MG PO TABS: 4.0000 mg | ORAL_TABLET | Freq: Four times a day (QID) | ORAL | Status: DC | PRN

## 2017-02-18 MED ORDER — ATORVASTATIN CALCIUM 20 MG PO TABS
20.0000 mg | ORAL_TABLET | Freq: Every day | ORAL | Status: DC
  Administered 2017-02-19 – 2017-03-02 (×12): 20 mg via ORAL
  Filled 2017-02-18 (×12): qty 1

## 2017-02-18 MED ORDER — ENSURE ENLIVE PO LIQD: 237.0000 mL | Freq: Two times a day (BID) | ORAL | 12 refills | Status: DC

## 2017-02-18 MED ORDER — ACETAMINOPHEN 325 MG PO TABS
650.0000 mg | ORAL_TABLET | Freq: Four times a day (QID) | ORAL | Status: DC | PRN
  Administered 2017-02-19 – 2017-03-01 (×2): 650 mg via ORAL
  Filled 2017-02-18 (×2): qty 2

## 2017-02-18 MED ORDER — ASPIRIN 81 MG PO CHEW
81.0000 mg | CHEWABLE_TABLET | Freq: Two times a day (BID) | ORAL | Status: DC
  Administered 2017-02-18 – 2017-03-02 (×24): 81 mg via ORAL
  Filled 2017-02-18 (×23): qty 1

## 2017-02-18 MED ORDER — MECLIZINE HCL 25 MG PO TABS: 25.0000 mg | ORAL_TABLET | Freq: Three times a day (TID) | ORAL | Status: DC | PRN

## 2017-02-18 MED ORDER — LORATADINE 10 MG PO TABS
10.0000 mg | ORAL_TABLET | Freq: Every day | ORAL | Status: DC
  Administered 2017-02-19 – 2017-03-02 (×12): 10 mg via ORAL
  Filled 2017-02-18 (×12): qty 1

## 2017-02-18 MED ORDER — SORBITOL 70 % SOLN
30.0000 mL | Freq: Every day | Status: DC | PRN
  Administered 2017-02-18: 30 mL via ORAL
  Filled 2017-02-18 (×2): qty 30

## 2017-02-18 MED ORDER — HYDROCODONE-ACETAMINOPHEN 5-325 MG PO TABS
1.0000 | ORAL_TABLET | Freq: Four times a day (QID) | ORAL | Status: DC | PRN
  Administered 2017-02-18: 2 via ORAL
  Filled 2017-02-18: qty 2

## 2017-02-18 MED ORDER — TORSEMIDE 10 MG PO TABS: 10.0000 mg | ORAL_TABLET | Freq: Every day | ORAL | 0 refills | Status: AC | PRN

## 2017-02-18 MED ORDER — ONDANSETRON HCL 4 MG/2ML IJ SOLN: 4.0000 mg | Freq: Four times a day (QID) | INTRAMUSCULAR | Status: DC | PRN

## 2017-02-18 NOTE — Progress Notes (Signed)
Physical Therapy Treatment Patient Details Name: [name] [name] S Hanshaw MRN: [id number] DOB: Jan 11, 1918 Today's Date: 02/18/2017    History of Present Illness Pt is a 84 y/o female who presents after fall, sustaining a R femoral neck fracture. She is now s/p R hip hemiarthroplasty (anterior approach) and is WBAT - No active abduction allowed.    PT Comments    Pt progressing towards physical therapy goals. Was able to perform sit<>stand transfers with use of Stedy to pull to stand. Pt held static stand with min guard assist up to 2 minutes. Attempted some weight shifting but overall minimal. Pt continues to be motivated to work with therapy and return to PLOF.    Follow Up Recommendations  CIR;Supervision/Assistance - 24 hour     Equipment Recommendations  None recommended by PT    Recommendations for Other Services       Precautions / Restrictions Precautions Precautions: Fall;Anterior Hip Precaution Comments: no active abduction  Restrictions Weight Bearing Restrictions: Yes RLE Weight Bearing: Weight bearing as tolerated Other Position/Activity Restrictions: NO active ABDuction    Mobility  Bed Mobility Overal bed mobility: Needs Assistance Bed Mobility: Supine to Sit     Supine to sit: +2 for physical assistance;Mod assist;HOB elevated     General bed mobility comments: Pt received sitting up in chair.   Transfers Overall transfer level: Needs assistance Equipment used: Rolling walker (2 wheeled);2 person hand held assist Transfers: Sit to/from Stand Sit to Stand: Max assist;+2 physical assistance;From elevated surface   Squat pivot transfers: Max assist;+2 physical assistance     General transfer comment: Antony SalmonStedy utilized for sit<>stand x2 during session. Pt required max to stand but once standing, pt able to hold with min assist to min guard assist for up to 2 minutes. Pt attempted weight shifting and able to reposition RLE somewhat on the foot plate of the Stedy, but  unable to shift weight enough to the R to reposition the LLE.   Ambulation/Gait             General Gait Details: Unable at this time.    Stairs            Wheelchair Mobility    Modified Rankin (Stroke Patients Only)       Balance Overall balance assessment: Needs assistance Sitting-balance support: Feet supported;No upper extremity supported Sitting balance-Leahy Scale: Fair Sitting balance - Comments: At least 1 UE support at all times for sitting balance. Trunk flexion.    Standing balance support: Bilateral upper extremity supported;During functional activity Standing balance-Leahy Scale: Zero Standing balance comment: +2 required for standing balance.                             Cognition Arousal/Alertness: Awake/alert Behavior During Therapy: WFL for tasks assessed/performed Overall Cognitive Status: Difficult to assess                                 General Comments: Pt very difficult to understand at times.       Exercises General Exercises - Lower Extremity Quad Sets: 10 reps Long Arc Quad: 10 reps    General Comments General comments (skin integrity, edema, etc.): On RA with O2 sats 95-96 except briefly 78-80 during pivoting. Recovered quickly.      Pertinent Vitals/Pain Pain Assessment: Faces Faces Pain Scale: Hurts little more Pain Location: R hip and knee Pain Descriptors /  Indicators: Operative site guarding;Grimacing;Aching Pain Intervention(s): Limited activity within patient's tolerance;Monitored during session;Repositioned    Home Living                      Prior Function            PT Goals (current goals can now be found in the care plan section) Acute Rehab PT Goals Patient Stated Goal: Home at d/c PT Goal Formulation: With patient Time For Goal Achievement: 03/02/17 Potential to Achieve Goals: Good Progress towards PT goals: Progressing toward goals    Frequency    Min  3X/week      PT Plan Current plan remains appropriate    Co-evaluation              AM-PAC PT "6 Clicks" Daily Activity  Outcome Measure  Difficulty turning over in bed (including adjusting bedclothes, sheets and blankets)?: Unable Difficulty moving from lying on back to sitting on the side of the bed? : Unable Difficulty sitting down on and standing up from a chair with arms (e.g., wheelchair, bedside commode, etc,.)?: Unable Help needed moving to and from a bed to chair (including a wheelchair)?: A Lot Help needed walking in hospital room?: Total Help needed climbing 3-5 steps with a railing? : Total 6 Click Score: 7    End of Session Equipment Utilized During Treatment: Gait belt Activity Tolerance: Patient limited by pain Patient left: in chair;with call bell/phone within reach Nurse Communication: Mobility status PT Visit Diagnosis: Unsteadiness on feet (R26.81);History of falling (Z91.81);Muscle weakness (generalized) (M62.81);Pain Pain - Right/Left: Right Pain - part of body: Hip     Time: 1147-1209 PT Time Calculation (min) (ACUTE ONLY): 22 min  Charges:  $Gait Training: 8-22 mins                    G Codes:       Conni Slipper, PT, DPT Acute Rehabilitation Services Pager: [phone]    Marylynn Pearson 02/18/2017, 12:23 PM

## 2017-02-18 NOTE — Plan of Care (Signed)
  Education: Knowledge of General Education information will improve 02/18/2017 0517 - Progressing by Olena Materobinson, Jancarlos Thrun G, RN Note POC reviewed with pt.

## 2017-02-18 NOTE — PMR Pre-admission (Signed)
PMR Admission Coordinator Pre-Admission Assessment  Patient: [name] [name] is an 84 y.o., female MRN: [id number] DOB: 1917-08-12 Height: 4\' 10"  (147.3 cm) Weight: 58.1 kg (128 lb)            Insurance Information HMO: No    PPO:       PCP:       IPA:       80/20:       OTHER:   PRIMARY:  Medicare A and B      Policy#: [id number]      Subscriber: patient CM Name:        Phone#:       Fax#:   Pre-Cert#:        Employer:  Retired Benefits:  Phone #:       Name:  Checked in passport one source [employer]. Date: 05/17/84     Deduct: $1364      Out of Pocket Max: none      Life Max: N/A CIR: 100%      SNF: 100 days Outpatient: 80%     Co-Pay: 20% Home Health: 100%      Co-Pay: none DME: 80%     Co-Pay: 20% Providers: patient's choice  SECONDARY: Medicaid [place] access      Policy#: [id number]      Subscriber: patient CM Name:        Phone#:       Fax#:   Pre-Cert#:        Employer:  Retired Benefits:  Phone #: [phone]     Name: Automated Eff. Date: Eligible 02/18/17 with coverage code [id number]     Deduct:        Out of Pocket Max:        Life Max:   CIR:        SNF:   Outpatient:       Co-Pay:   Home Health:        Co-Pay:   DME:       Co-Pay:    Emergency Contact Information Contact Information    Name Relation Home Work Mobile   Reindel,Wendell Son [phone]  [phone]   Boris Sharper [phone]  [phone]     Current Medical History  Patient Admitting Diagnosis:  R THR post fall with R hip fracture  History of Present Illness: A 84 y.o.right handed femalewith history of diet-controlled diabetes mellitus on no oral diabetic medication, CKD stage III, CVA, hypertension. Per chart reviewand family,patient lives with herson, who can only provide supervision at discharge. One level home with ramped entrance.  Used a rolling walker at times prior to admission. Presented 02/15/2017 after mechanical fall landing on her right hip. Denied any loss of consciousness dizziness  or chest pain. X-rays and imaging revealed displaced right femoral neck fracture. Underwent right hip hemiarthroplasty anterior approach 02/15/2017 per Dr. Linna Caprice. Hospital course pain management. Weightbearing as tolerated.NOactive abduction. Acute blood loss anemia 6.6 transfuse with follow-up hemoglobin 10.5. Patient remains on Plavix as prior to hospital admission as well as aspirin which was increased to 81 mg twice a day. Physical and occupational therapy evaluations completed with recommendations of physical medicine rehabilitation consult. Patient to be admitted for a comprehensive inpatient rehabilitation program.  Past Medical History  Past Medical History:  Diagnosis Date  . Arthritis   . Diabetes mellitus type II 02/19/2011  . Dizziness   . Gout   . Gout spondylitis   . 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  Hypertension   . Sciatica   . Stroke [facility])     Family History  family history includes Stroke in her father and mother.  Prior Rehab/Hospitalizations: No previous rehab.  Has the patient had major surgery during 100 days prior to admission? No  Current Medications   Current Facility-Administered Medications:  .  acetaminophen (TYLENOL) tablet 650 mg, 650 mg, Oral, Q6H PRN **OR** acetaminophen (TYLENOL) suppository 650 mg, 650 mg, Rectal, Q6H PRN, Swinteck, Arlys John, MD .  aspirin chewable tablet 81 mg, 81 mg, Oral, BID WC, Swinteck, Arlys John, MD, 81 mg at 02/18/17 0959 .  atorvastatin (LIPITOR) tablet 20 mg, 20 mg, Oral, Daily, Bobette Mo, MD, 20 mg at 02/18/17 0959 .  clopidogrel (PLAVIX) tablet 75 mg, 75 mg, Oral, Daily, Swinteck, Arlys John, MD, 75 mg at 02/18/17 0959 .  famotidine (PEPCID) tablet 20 mg, 20 mg, Oral, Daily, Bobette Mo, MD, 20 mg at 02/18/17 1000 .  feeding supplement (ENSURE ENLIVE) (ENSURE ENLIVE) liquid 237 mL, 237 mL, Oral, BID BM, Narda Bonds, MD, 237 mL at 02/18/17 1217 .  fesoterodine  (TOVIAZ) tablet 4 mg, 4 mg, Oral, Daily, Bobette Mo, MD, 4 mg at 02/18/17 0959 .  HYDROcodone-acetaminophen (NORCO/VICODIN) 5-325 MG per tablet 1-2 tablet, 1-2 tablet, Oral, Q6H PRN, Samson Frederic, MD, 1 tablet at 02/18/17 0553 .  loratadine (CLARITIN) tablet 10 mg, 10 mg, Oral, Daily, Bobette Mo, MD, 10 mg at 02/18/17 1000 .  meclizine (ANTIVERT) tablet 25 mg, 25 mg, Oral, TID PRN, Bobette Mo, MD .  menthol-cetylpyridinium (CEPACOL) lozenge 3 mg, 1 lozenge, Oral, PRN **OR** phenol (CHLORASEPTIC) mouth spray 1 spray, 1 spray, Mouth/Throat, PRN, Swinteck, Arlys John, MD .  metoCLOPramide (REGLAN) tablet 5-10 mg, 5-10 mg, Oral, Q8H PRN **OR** metoCLOPramide (REGLAN) injection 5-10 mg, 5-10 mg, Intravenous, Q8H PRN, Swinteck, Arlys John, MD .  morphine 2 MG/ML injection 0.5 mg, 0.5 mg, Intravenous, Q2H PRN, Swinteck, Arlys John, MD .  ondansetron (ZOFRAN) tablet 4 mg, 4 mg, Oral, Q6H PRN **OR** ondansetron (ZOFRAN) injection 4 mg, 4 mg, Intravenous, Q6H PRN, Samson Frederic, MD  Patients Current Diet: Diet Heart Room service appropriate? Yes; Fluid consistency: Thin Diet - low sodium heart healthy  Precautions / Restrictions Precautions Precautions: Fall, Anterior Hip Precaution Comments: no active abduction  Restrictions Weight Bearing Restrictions: Yes RLE Weight Bearing: Weight bearing as tolerated Other Position/Activity Restrictions: NO active ABDuction   Has the patient had 2 or more falls or a fall with injury in the past year?No.  Reports one fall which resulted in current hospital admission.  Prior Activity Level Limited Community (1-2x/wk): Went to church on Sunday, went out 1 X a week.  Home Assistive Devices / Equipment Home Assistive Devices/Equipment: Eyeglasses, Dentures (specify type), Walker (specify type), Bedside commode/3-in-1, Grab bars in shower(front wheel walker) Home Equipment: Walker - 2 wheels  Prior Device Use: Indicate devices/aids used by the  patient prior to current illness, exacerbation or injury? Walker and [employer]  Prior Functional Level Prior Function Level of Independence: Needs assistance Gait / Transfers Assistance Needed: Difficult to determine but per PT pt was using RW at all times.  ADL's / Homemaking Assistance Needed: Pt very tangential and difficult to understand her answers to PLOF questions. Per physical therapist pt occasionally cooks but son mostly brings in food for her to heat up. She reports independence with bathing tasks but does wash-ups instead of stepping into her tub.   Self Care: Did the patient need help bathing, dressing, using the  toilet or eating?  Needed some help  Indoor Mobility: Did the patient need assistance with walking from room to room (with or without device)? Independent  Stairs: Did the patient need assistance with internal or external stairs (with or without device)? Needed some help  Functional Cognition: Did the patient need help planning regular tasks such as shopping or remembering to take medications? Independent  Current Functional Level Cognition  Overall Cognitive Status: Difficult to assess Orientation Level: Oriented X4 General Comments: Pt very difficult to understand at times.     Extremity Assessment (includes Sensation/Coordination)  Upper Extremity Assessment: Overall WFL for tasks assessed  Lower Extremity Assessment: RLE deficits/detail RLE Deficits / Details: Acute pain, decreased strength and AROM consistent with above mentioned procedure. Noted general weakness in LLE as well.  RLE: Unable to fully assess due to pain    ADLs  Overall ADL's : Needs assistance/impaired Eating/Feeding: Set up, Sitting Grooming: Supervision/safety, Sitting Upper Body Bathing: Moderate assistance, Sitting Lower Body Bathing: Moderate assistance, Sitting/lateral leans Upper Body Dressing : Moderate assistance, Sitting Lower Body Dressing: Moderate assistance, Sitting/lateral  leans Toilet Transfer: Maximal assistance, +2 for physical assistance, Squat-pivot Toilet Transfer Details (indicate cue type and reason): simulated with EOB> recliner. 1-2 times pt close to briefly achieving full standing but mostly in trunk flexion position. Difficulty taking pivotal steps but able to do so minimally.  Toileting- Clothing Manipulation and Hygiene: Total assistance, Bed level Functional mobility during ADLs: Maximal assistance(squat pivot only) General ADL Comments: Bed mobility and squat-pivot to recliner. successful on second transfer attempt after seated rest break.     Mobility  Overal bed mobility: Needs Assistance Bed Mobility: Supine to Sit Supine to sit: +2 for physical assistance, Mod assist, HOB elevated Sit to supine: Mod assist, +2 for physical assistance General bed mobility comments: Pt received sitting up in chair.     Transfers  Overall transfer level: Needs assistance Equipment used: Rolling walker (2 wheeled), 2 person hand held assist Transfer via Lift Equipment: Stedy Transfers: Sit to/from Stand Sit to Stand: Max assist, +2 physical assistance, From elevated surface Squat pivot transfers: Max assist, +2 physical assistance General transfer comment: Stedy utilized for sit<>stand x2 during session. Pt required max to stand but once standing, pt able to hold with min assist to min guard assist for up to 2 minutes. Pt attempted weight shifting and able to reposition RLE somewhat on the foot plate of the Stedy, but unable to shift weight enough to the R to reposition the LLE.     Ambulation / Gait / Stairs / Wheelchair Mobility  Ambulation/Gait General Gait Details: Unable at this time.     Posture / Balance Dynamic Sitting Balance Sitting balance - Comments: At least 1 UE support at all times for sitting balance. Trunk flexion.  Balance Overall balance assessment: Needs assistance Sitting-balance support: Feet supported, No upper extremity  supported Sitting balance-Leahy Scale: Fair Sitting balance - Comments: At least 1 UE support at all times for sitting balance. Trunk flexion.  Standing balance support: Bilateral upper extremity supported, During functional activity Standing balance-Leahy Scale: Zero Standing balance comment: +2 required for standing balance.     Special needs/care consideration BiPAP/CPAP No CPM No Continuous Drip IV No  Dialysis No    Life Vest No Oxygen No Special Bed No Trach Size No Wound Vac (area) No      Skin Right hip post op incision with dressing  Bowel mgmt: Last BM was prior to admission Bladder mgmt: Incontinence Diabetic mgmt No    Previous Home Environment Living Arrangements: Children Available Help at Discharge: Family, Available 24 hours/day Type of Home: House Home Layout: One level Home Access: Ramped entrance Bathroom Shower/Tub: Engineer, manufacturing systemsTub/shower unit Bathroom Toilet: Standard Home Care Services: Yes Type of Home Care Services: Homehealth aide [facility]  Discharge Living Setting Plans for Discharge Living Setting: Patient's home, House, Lives with (comment)(Son lives with patient.) Type of Home at Discharge: House Discharge Home Layout: Two level, Laundry or work area in basement, Able to live on main level with bedroom/bathroom Alternate Level Stairs-Number of Steps: Flight Discharge Home Access: Ramped entrance(Has a ramp a front entrance.) Does the patient have any problems obtaining your medications?: No  Social/Family/Support Systems Patient Roles: Parent(Has 3 sons.) Contact Information: Celesta GentileWendell Horrigan - son - [phone] Anticipated Caregiver: Melvyn NethLewis - son - can only provide supervision.  May need SNF post inpatient rehab Ability/Limitations of Caregiver: Remi HaggardSon Lewis is disabled and does not work Engineer, structuralCaregiver Availability: 24/7 Discharge Plan Discussed with Primary Caregiver: Yes Is Caregiver In Agreement with  Plan?: Yes Does Caregiver/Family have Issues with Lodging/Transportation while Pt is in Rehab?: No  Goals/Additional Needs Patient/Family Goal for Rehab: PT/OT min to mod assist goals Expected length of stay: 17-20 days Cultural Considerations: [employer] Dietary Needs: Heart diet, thin liquids Equipment Needs: TBD Pt/Family Agrees to Admission and willing to participate: Yes Program Orientation Provided & Reviewed with Pt/Caregiver Including Roles  & Responsibilities: Yes  Decrease burden of Care through IP rehab admission: Decrease number of caregivers, Bowel and bladder program and Other will see if she can progress sufficiently for discharge home.  Possible need for SNF placement upon discharge: Yes, likely will need SNF after rehab if she cannot progress to level where son can provide supervision at home.  Patient Condition: This patient's condition remains as documented in the consult dated 02/17/17, in which the Rehabilitation Physician determined and documented that the patient's condition is appropriate for intensive rehabilitative care in an inpatient rehabilitation facility. Will admit to inpatient rehab today.  Preadmission Screen Completed By:  Trish MageLogue, Shanon Seawright M, 02/18/2017 2:59 PM ______________________________________________________________________   Discussed status with Dr. Wynn BankerKirsteins on 02/18/17 at 1459 and received telephone approval for admission today.  Admission Coordinator:  Trish MageLogue, Mahasin Riviere M, time 1459/Date 02/18/17

## 2017-02-18 NOTE — Progress Notes (Signed)
[place] bladder scanned pt and pt was retaining 306 or urine. RN paged MD and MD ordered RN to I/O cath patient 11 and continue to monitor.

## 2017-02-18 NOTE — H&P (Signed)
Physical Medicine and Rehabilitation Admission H&P        Chief Complaint  Patient presents with  . Fall  : HPI: [name] [name] is a 84 y.o. right handed female with history of diet-controlled diabetes mellitus on no oral diabetic medication, CKD stage III, CVA, hypertension. Per chart review and family, patient lives with her son, who can only provide supervision at discharge. One level home with ramped entrance.  Used a rolling walker at times prior to admission. Presented 02/15/2017 after mechanical fall landing on her right hip. Denied any loss of consciousness dizziness or chest pain. X-rays and imaging revealed displaced right femoral neck fracture. Underwent right hip hemiarthroplasty anterior approach 02/15/2017 per Dr. Lyla Glassing. Hospital course pain management. Weightbearing as tolerated. NO active abduction. Acute blood loss anemia 6.6 transfuse with follow-up hemoglobin 10.5. Patient remains on Plavix as prior to hospital admission as well as aspirin which was increased to 81 mg twice a day. Physical and occupational therapy evaluations completed with recommendations of physical medicine rehabilitation consult. Patient was admitted for a comprehensive rehabilitation program       Review of Systems  Constitutional: Negative for chills and fever.  HENT: Positive for hearing loss.   Eyes: Negative for blurred vision and double vision.  Respiratory: Negative for cough and shortness of breath.   Cardiovascular: Negative for chest pain and palpitations.  Gastrointestinal: Positive for constipation. Negative for vomiting.  Musculoskeletal: Positive for back pain, falls and myalgias.  Skin: Negative for rash.  Neurological: Positive for dizziness and weakness. Negative for seizures.  All other systems reviewed and are negative.   Past Medical History:  Diagnosis Date  . Arthritis    . Diabetes mellitus type II 02/19/2011  . Dizziness    . Gout    . Gout spondylitis    . H/O: CVA  (cardiovascular accident) 02/19/2011    Cerebellar distribution  . Hypertension    . Sciatica    . Stroke [facility])           Past Surgical History:  Procedure Laterality Date  . ABDOMINAL HYSTERECTOMY      . HIP PINNING,CANNULATED Right 02/15/2017    Procedure: ANTERIOR APPROACH HEMI HIP ARTHROPLASTY;  Surgeon: Rod Can, MD;  Location: [place];  Service: Orthopedics;  Laterality: Right;         Family History  Problem Relation Age of Onset  . Stroke Mother    . Stroke Father      Social History:  reports that  has never smoked. she has never used smokeless tobacco. She reports that she does not drink alcohol or use drugs. Allergies:       Allergies  Allergen Reactions  . Bee Venom Swelling  . Penicillins Other (See Comments)      Has patient had a PCN reaction causing immediate rash, facial/tongue/throat swelling, SOB or lightheadedness with hypotension: Unknown Has patient had a PCN reaction causing severe rash involving mucus membranes or skin necrosis: Unknown Has patient had a PCN reaction that required hospitalization: Unknown Has patient had a PCN reaction occurring within the last 10 years: Unknown If all of the above answers are "NO", then may proceed with Cephalosporin use.            Medications Prior to Admission  Medication Sig Dispense Refill  . allopurinol (ZYLOPRIM) 100 MG tablet Take 300 mg by mouth at bedtime.        Marland Kitchen aspirin EC 81 MG tablet Take 81 mg by mouth daily.      Marland Kitchen  atorvastatin (LIPITOR) 20 MG tablet Take 1 tablet by mouth daily.      . clopidogrel (PLAVIX) 75 MG tablet Take 75 mg by mouth daily.       . Coenzyme Q10 (COQ10 PO) Take 100 mg by mouth daily.      . colchicine 0.6 MG tablet Take 0.6 mg by mouth daily as needed. gout       . famotidine (PEPCID) 20 MG tablet Take 20 mg by mouth daily.       Marland Kitchen loratadine (CLARITIN) 10 MG tablet Take 10 mg by mouth daily.      . meclizine (ANTIVERT) 25 MG tablet Take 25 mg by mouth 3 (three) times  daily as needed. For nausea      . tolterodine (DETROL LA) 2 MG 24 hr capsule Take 4 mg by mouth daily.       Marland Kitchen torsemide (DEMADEX) 10 MG tablet Take 10 mg by mouth daily.           Drug Regimen Review Drug regimen was reviewed and remains appropriate with no significant issues identified   Home: Home Living Family/patient expects to be discharged to:: Private residence Living Arrangements: Children Available Help at Discharge: Family, Available 24 hours/day Type of Home: House Home Access: Ramped entrance Home Layout: One level Bathroom Shower/Tub: Chiropodist: Standard Home Equipment: Environmental consultant - 2 wheels   Functional History: Prior Function Level of Independence: Needs assistance Gait / Transfers Assistance Needed: Difficult to determine but per PT pt was using RW at all times.  ADL's / Homemaking Assistance Needed: Pt very tangential and difficult to understand her answers to PLOF questions. Per physical therapist pt occasionally cooks but son mostly brings in food for her to heat up. She reports independence with bathing tasks but does wash-ups instead of stepping into her tub.    Functional Status:  Mobility: Bed Mobility Overal bed mobility: Needs Assistance Bed Mobility: Sit to Supine Supine to sit: Mod assist, +2 for physical assistance, HOB elevated Sit to supine: Mod assist, +2 for physical assistance General bed mobility comments: Assist to return B LE into bed.  Transfers Overall transfer level: Needs assistance Equipment used: Rolling walker (2 wheeled), 2 person hand held assist Transfers: Squat Pivot Transfers Sit to Stand: Max assist, +2 physical assistance, From elevated surface Squat pivot transfers: Max assist, +2 physical assistance General transfer comment: Unable to utilize RW to come into full standing position. Utilized 2 person assist without RW and support under hips to squat-pivot from bed to chair.  Ambulation/Gait General Gait  Details: Unable at this time.    ADL: ADL Overall ADL's : Needs assistance/impaired Eating/Feeding: Set up, Sitting Grooming: Supervision/safety, Sitting Upper Body Bathing: Moderate assistance, Sitting Lower Body Bathing: Moderate assistance, Sitting/lateral leans Upper Body Dressing : Moderate assistance, Sitting Lower Body Dressing: Moderate assistance, Sitting/lateral leans Toilet Transfer: Maximal assistance, +2 for physical assistance, Squat-pivot Toileting- Clothing Manipulation and Hygiene: Total assistance, Bed level Functional mobility during ADLs: Maximal assistance(squat-pivot only) General ADL Comments: Pt very limited by pain    Cognition: Cognition Overall Cognitive Status: Difficult to assess Orientation Level: Oriented X4 Cognition Arousal/Alertness: Awake/alert Behavior During Therapy: WFL for tasks assessed/performed Overall Cognitive Status: Difficult to assess General Comments: Pt difficult to understand. She reports thinking her son had arrived to take her home today and did not remember that she had already had surgery. She was not able to remember her home phone number and demonstrated difficulty problem solving at times. Pt  easily distracted by her pain.    Physical Exam: Blood pressure (!) 142/57, pulse 86, temperature 98.7 F (37.1 C), temperature source Oral, resp. rate 16, height '4\' 10"'  (1.473 m), weight 58.1 kg (128 lb), SpO2 93 %. Physical Exam Vitals reviewed. Constitutional: She appears well-developed.  Frail  HENT:  Head: Normocephalic and atraumatic.  Eyes: EOM are normal. Left eye exhibits discharge.  Neck: Normal range of motion. Neck supple. No thyromegaly present.  Cardiovascular: Normal rate, regular rhythm and normal heart sounds.  Respiratory: Effort normal and breath sounds normal. No respiratory distress.  +[place]  GI: Soft. Bowel sounds are normal. She exhibits no distension Skin. Incision dressed appropriately tender Musculoskeletal:  She exhibits edema (LE). She exhibits no tenderness.  Neurological: She is alert.  Patient is hard of hearing. Speech is a bit delayed  Make good eye contact with examiner. Motor: B/l UE 5/5 proximal to distal B/l LE:  2/5 proximal to distal (right weaker than left with some pain inhibition) Reduced ankle range of motion actively bilateral tight heel cords Follows commands inconsistently with repitition   Patient intact to light touch in both lower limbs.   Lab Results Last 48 Hours        Results for orders placed or performed during the hospital encounter of 02/14/17 (from the past 48 hour(s))  Glucose, capillary     Status: Abnormal    Collection Time: 02/17/17 12:20 AM  Result Value Ref Range    Glucose-Capillary 132 (H) 65 - 99 mg/dL    Comment 1 Document in Chart    CBC     Status: Abnormal    Collection Time: 02/17/17  4:52 AM  Result Value Ref Range    WBC 8.0 4.0 - 10.5 K/uL    RBC 2.34 (L) 3.87 - 5.11 MIL/uL    Hemoglobin 6.7 (LL) 12.0 - 15.0 g/dL      Comment: REPEATED TO VERIFY CRITICAL RESULT CALLED TO, READ BACK BY AND VERIFIED WITH: D BENTON,RN 02/17/17 0749 BDAVIS CORRECTED ON 01/02 AT 1407: PREVIOUSLY REPORTED AS 6.7 REPEATED TO VERIFY CRITICAL RESULT CALLED TO, READ BACK BY AND VERIFIED WITH: D BENTON,RN 02/16/17 0749 BDAVID      HCT 20.2 (L) 36.0 - 46.0 %    MCV 86.3 78.0 - 100.0 fL    MCH 28.6 26.0 - 34.0 pg    MCHC 33.2 30.0 - 36.0 g/dL    RDW 16.5 (H) 11.5 - 15.5 %    Platelets 113 (L) 150 - 400 K/uL      Comment: CONSISTENT WITH PREVIOUS RESULT REPEATED TO VERIFY    Basic metabolic panel     Status: Abnormal    Collection Time: 02/17/17  4:52 AM  Result Value Ref Range    Sodium 139 135 - 145 mmol/L    Potassium 4.6 3.5 - 5.1 mmol/L    Chloride 106 101 - 111 mmol/L    CO2 24 22 - 32 mmol/L    Glucose, Bld 130 (H) 65 - 99 mg/dL    BUN 30 (H) 6 - 20 mg/dL    Creatinine, Ser 1.78 (H) 0.44 - 1.00 mg/dL    Calcium 8.0 (L) 8.9 - 10.3 mg/dL    GFR calc non  Af Amer 23 (L) >60 mL/min    GFR calc Af Amer 26 (L) >60 mL/min      Comment: (NOTE) The eGFR has been calculated using the CKD EPI equation. This calculation has not been validated in all clinical situations. eGFR's  persistently <60 mL/min signify possible Chronic Kidney Disease.      Anion gap 9 5 - 15  Glucose, capillary     Status: Abnormal    Collection Time: 02/17/17  7:03 AM  Result Value Ref Range    Glucose-Capillary 125 (H) 65 - 99 mg/dL    Comment 1 Document in Chart    Hemoglobin and hematocrit, blood     Status: Abnormal    Collection Time: 02/17/17  9:15 AM  Result Value Ref Range    Hemoglobin 6.9 (LL) 12.0 - 15.0 g/dL      Comment: REPEATED TO VERIFY CRITICAL VALUE NOTED.  VALUE IS CONSISTENT WITH PREVIOUSLY REPORTED AND CALLED VALUE.      HCT 21.4 (L) 36.0 - 46.0 %  Prepare RBC     Status: None    Collection Time: 02/17/17 11:24 AM  Result Value Ref Range    Order Confirmation ORDER PROCESSED BY BLOOD BANK    Glucose, capillary     Status: Abnormal    Collection Time: 02/17/17 11:46 AM  Result Value Ref Range    Glucose-Capillary 121 (H) 65 - 99 mg/dL  Glucose, capillary     Status: Abnormal    Collection Time: 02/17/17  4:42 PM  Result Value Ref Range    Glucose-Capillary 126 (H) 65 - 99 mg/dL  Glucose, capillary     Status: Abnormal    Collection Time: 02/18/17 12:44 AM  Result Value Ref Range    Glucose-Capillary 123 (H) 65 - 99 mg/dL  Glucose, capillary     Status: Abnormal    Collection Time: 02/18/17  6:19 AM  Result Value Ref Range    Glucose-Capillary 113 (H) 65 - 99 mg/dL      Imaging Results (Last 48 hours)  No results found.           Medical Problem List and Plan: 1.  Decreased functional mobility secondary to displaced right femoral neck fracture after fall. Status post right hip hemiarthroplasty anterior approach 02/15/2017. Weightbearing as tolerated no active abduction 2.  DVT Prophylaxis/Anticoagulation: Aspirin 81 mg twice a day.  Check vascular study 3. Pain Management: Hydrocodone as needed 4. Mood: Provide emotional support 5. Neuropsych: This patient is capable of making decisions on her own behalf. 6. Skin/Wound Care: Routine skin checks 7. Fluids/Electrolytes/Nutrition: Routine I&O's with follow-up chemistries 8. Acute blood loss anemia. Follow-up CBC 9. History of CVA. Continue Plavix aspirin prior to admission. 10. Diet controlled diabetes mellitus. Patient on no oral diabetic agents prior to admission. Blood sugar checks have been discontinued 11. Hypertension. Demadex 10 mg daily. Monitor with increased mobility 12. CKD stage III, follow-up chemistries 13. History of dizziness. Continue Antivert as needed 14. History of gout. Zyloprim 300 mg at bedtime. Monitor for any gout flare ups 15. History of overactive bladder. Toviaz 4 mg daily. Check PVR 3 16. Hyperlipidemia. Lipitor 17. Constipation. Laxative assistance       Post Admission Physician Evaluation: 1. Functional deficits secondary  to right subcapital fracture status post right hip hemiarthroplasty. 2. Patient is admitted to receive collaborative, interdisciplinary care between the physiatrist, rehab nursing staff, and therapy team. 3. Patient's level of medical complexity and substantial therapy needs in context of that medical necessity cannot be provided at a lesser intensity of care such as a SNF. 4. Patient has experienced substantial functional loss from his/her baseline which was documented above under the "Functional History" and "Functional Status" headings.  Judging by the patient's diagnosis, physical exam, and  functional history, the patient has potential for functional progress which will result in measurable gains while on inpatient rehab.  These gains will be of substantial and practical use upon discharge  in facilitating mobility and self-care at the household level. 5. Physiatrist will provide 24 hour management of medical needs as  well as oversight of the therapy plan/treatment and provide guidance as appropriate regarding the interaction of the two. 6. The Preadmission Screening has been reviewed and patient status is unchanged unless otherwise stated above. 7. 24 hour rehab nursing will assist with bladder management, bowel management, safety, skin/wound care, disease management, medication administration, pain management and patient education  and help integrate therapy concepts, techniques,education, etc. 8. PT will assess and treat for/with: pre gait, gait training, endurance , safety, equipment, neuromuscular re education.   Goals are: Min/S. 9. OT will assess and treat for/with: ADLs, Cognitive perceptual skills, Neuromuscular re education, safety, endurance, equipment.   Goals are: Min A. Therapy May proceed with showering this patient. 10. SLP will assess and treat for/with: NA.  Goals are: NA. 11. Case Management and Social Worker will assess and treat for psychological issues and discharge planning. 12. Team conference will be held weekly to assess progress toward goals and to determine barriers to discharge. 13. Patient will receive at least 3 hours of therapy per day at least 5 days per week. 14. ELOS: 14-17d       15. Prognosis:  good         Charlett Blake M.D. [facility] FAAPM&R (Sports Med, Neuromuscular Med) Diplomate Am Board of Electrodiagnostic Med  Cathlyn Parsons, PA-C 02/18/2017

## 2017-02-18 NOTE — Progress Notes (Signed)
Physical Therapy Treatment Patient Details Name: [name] [name] MRN: [id number] DOB: 21-Dec-1917 Today's Date: 02/18/2017    History of Present Illness Pt is a 84 y/o female who presents after fall, sustaining a R femoral neck fracture. She is now s/p R hip hemiarthroplasty (anterior approach) and is WBAT - No active abduction allowed.    PT Comments    Nursing requesting PT to assist with mobility at this time. Assisted patient from bedside chair into bed with +2 assistance. Session focused on transfers bed mobility and introducing new therex. Patient fatigued from earlier session and unable to progress this visit, however performed supine therex well.     Follow Up Recommendations  CIR;Supervision/Assistance - 24 hour     Equipment Recommendations  None recommended by PT    Recommendations for Other Services       Precautions / Restrictions Precautions Precautions: Fall;Anterior Hip Precaution Comments: no active abduction  Restrictions Weight Bearing Restrictions: Yes RLE Weight Bearing: Weight bearing as tolerated    Mobility  Bed Mobility Overal bed mobility: Needs Assistance Bed Mobility: Sit to Supine       Sit to supine: Mod assist;+2 for physical assistance   General bed mobility comments: Pt received sitting up in chair.   Transfers Overall transfer level: Needs assistance Equipment used: 2 person hand held assist Transfers: Sit to/from Visteon CorporationStand;Squat Pivot Transfers Sit to Stand: Max assist;+2 physical assistance   Squat pivot transfers: Max assist;+2 physical assistance     General transfer comment: Attemped with Max A x2 to stand into RW from bedside chair, unable to complete transfer. Utilized bed pad and gait beld to perform squat pivot transfer into bed.   Ambulation/Gait             General Gait Details: unable at this time   Stairs            Wheelchair Mobility    Modified Rankin (Stroke Patients Only)       Balance Overall  balance assessment: Needs assistance Sitting-balance support: Feet supported;No upper extremity supported Sitting balance-Leahy Scale: Fair Sitting balance - Comments: At least 1 UE support at all times for sitting balance. Trunk flexion.    Standing balance support: Bilateral upper extremity supported;During functional activity Standing balance-Leahy Scale: Zero                              Cognition Arousal/Alertness: Awake/alert Behavior During Therapy: WFL for tasks assessed/performed Overall Cognitive Status: Difficult to assess                                 General Comments: Pt very difficult to understand at times.       Exercises General Exercises - Lower Extremity Ankle Circles/Pumps: 10 reps Quad Sets: 10 reps;Both Long Arc Quad: 10 reps;Both Hip ABduction/ADduction: 10 reps;Both    General Comments        Pertinent Vitals/Pain Pain Assessment: Faces Faces Pain Scale: Hurts even more Pain Location: R hip and knee Pain Descriptors / Indicators: Operative site guarding;Grimacing;Aching Pain Intervention(s): Limited activity within patient's tolerance;Monitored during session;Premedicated before session    Home Living                      Prior Function            PT Goals (current goals can now be found in  the care plan section) Acute Rehab PT Goals Patient Stated Goal: non stated PT Goal Formulation: With patient Time For Goal Achievement: 03/02/17 Potential to Achieve Goals: Good Progress towards PT goals: Progressing toward goals    Frequency    Min 3X/week      PT Plan Current plan remains appropriate    Co-evaluation              AM-PAC PT "6 Clicks" Daily Activity  Outcome Measure  Difficulty turning over in bed (including adjusting bedclothes, sheets and blankets)?: Unable Difficulty moving from lying on back to sitting on the side of the bed? : Unable Difficulty sitting down on and standing  up from a chair with arms (e.g., wheelchair, bedside commode, etc,.)?: Unable Help needed moving to and from a bed to chair (including a wheelchair)?: A Lot Help needed walking in hospital room?: Total Help needed climbing 3-5 steps with a railing? : Total 6 Click Score: 7    End of Session Equipment Utilized During Treatment: Gait belt Activity Tolerance: Patient limited by pain Patient left: in bed;with nursing/sitter in room;with call bell/phone within reach Nurse Communication: Mobility status PT Visit Diagnosis: Unsteadiness on feet (R26.81);History of falling (Z91.81);Muscle weakness (generalized) (M62.81);Pain Pain - Right/Left: Right Pain - part of body: Hip     Time: 9147-8295 PT Time Calculation (min) (ACUTE ONLY): 17 min  Charges:  $Therapeutic Activity: 8-22 mins                    G Codes:       Etta Grandchild, PT, DPT Acute Rehab Services Pager: [phone]     Etta Grandchild 02/18/2017, 6:13 PM

## 2017-02-18 NOTE — Progress Notes (Signed)
Night nurse told day shift RN that pt was achs blood sugar. I have not seen any meal coverage orders and paged MD awaiting call back pt blood sugar was 171

## 2017-02-18 NOTE — Progress Notes (Signed)
Pt admitted to 4M09. Vitals are stable and family is at bedside. Pt alert and oriented and in no pain. Continue plan of care.

## 2017-02-18 NOTE — Progress Notes (Signed)
Bladder scan by Rebecca,NT- 350cc; pt. still no void; Kirby,NP notified; orders placed.

## 2017-02-18 NOTE — Progress Notes (Signed)
Occupational Therapy Treatment Patient Details Name: [name] [name] S Binning MRN: [id number] DOB: 1917/04/07 Today's Date: 02/18/2017    History of present illness Pt is a 84 y/o female who presents after fall, sustaining a R femoral neck fracture. She is now s/p R hip hemiarthroplasty (anterior approach) and is WBAT - No active abduction allowed.   OT comments  Pt progressing towards acute OT goals. Pleasant and eager to work with therapy despite discomfort. Focus of session was bed mobility and simulated toilet transfer (EOB>recliner). D/c plan to CIR still appropriate.   Follow Up Recommendations  CIR;Supervision/Assistance - 24 hour    Equipment Recommendations  Other (comment)    Recommendations for Other Services Rehab consult    Precautions / Restrictions Precautions Precautions: Fall;Anterior Hip Precaution Comments: no active abduction  Restrictions Weight Bearing Restrictions: Yes RLE Weight Bearing: Weight bearing as tolerated Other Position/Activity Restrictions: NO active ABDuction       Mobility Bed Mobility Overal bed mobility: Needs Assistance Bed Mobility: Supine to Sit     Supine to sit: +2 for physical assistance;Mod assist;HOB elevated     General bed mobility comments: Pt using BUE to assist as able with bed mobility. Therapist utilized pad underneath pt to advance R hip to EOB position. Assist to advance RLE.   Transfers Overall transfer level: Needs assistance Equipment used: Rolling walker (2 wheeled);2 person hand held assist Transfers: Squat Pivot Transfers Sit to Stand: Max assist;+2 physical assistance;From elevated surface   Squat pivot transfers: Max assist;+2 physical assistance     General transfer comment: Pt with difficulty coming to enough of a standing position to utilized rw. Ultimately +2 HHA for squat pivot. Successful squat pivot on second attempt after seated rest break    Balance Overall balance assessment: Needs  assistance Sitting-balance support: Feet supported;No upper extremity supported Sitting balance-Leahy Scale: Fair Sitting balance - Comments: At least 1 UE support at all times for sitting balance. Trunk flexion.    Standing balance support: Bilateral upper extremity supported;During functional activity Standing balance-Leahy Scale: Zero Standing balance comment: +2 required for standing balance.                            ADL either performed or assessed with clinical judgement   ADL Overall ADL's : Needs assistance/impaired                         Toilet Transfer: Maximal assistance;+2 for physical assistance;Squat-pivot Toilet Transfer Details (indicate cue type and reason): simulated with EOB> recliner. 1-2 times pt close to briefly achieving full standing but mostly in trunk flexion position. Difficulty taking pivotal steps but able to do so minimally.          Functional mobility during ADLs: Maximal assistance(squat pivot only) General ADL Comments: Bed mobility and squat-pivot to recliner. successful on second transfer attempt after seated rest break.      Vision       Perception     Praxis      Cognition Arousal/Alertness: Awake/alert Behavior During Therapy: WFL for tasks assessed/performed Overall Cognitive Status: Difficult to assess                                 General Comments: Pt very difficult to understand at times.         Exercises     Shoulder Instructions  General Comments On RA with O2 sats 95-96 except briefly 78-80 during pivoting. Recovered quickly.    Pertinent Vitals/ Pain       Pain Assessment: Faces Faces Pain Scale: Hurts whole lot Pain Location: R hip and knee Pain Descriptors / Indicators: Operative site guarding;Grimacing;Aching Pain Intervention(s): Limited activity within patient's tolerance;Monitored during session;Repositioned  Home Living                                           Prior Functioning/Environment              Frequency  Min 2X/week        Progress Toward Goals  OT Goals(current goals can now be found in the care plan section)  Progress towards OT goals: Progressing toward goals  Acute Rehab OT Goals Patient Stated Goal: Home at d/c OT Goal Formulation: With patient Time For Goal Achievement: 03/02/17 Potential to Achieve Goals: Good ADL Goals Pt Will Perform Grooming: with min assist;standing Pt Will Perform Lower Body Dressing: with min assist;sit to/from stand Pt Will Transfer to Toilet: with min assist;stand pivot transfer;bedside commode Pt Will Perform Toileting - Clothing Manipulation and hygiene: with min assist;sit to/from stand Additional ADL Goal #1: Pt will demonstrate improved short-term memory by completing delayed recall task with no more than 1 VC.  Plan Discharge plan remains appropriate    Co-evaluation                 AM-PAC PT "6 Clicks" Daily Activity     Outcome Measure   Help from another person eating meals?: A Little Help from another person taking care of personal grooming?: A Little Help from another person toileting, which includes using toliet, bedpan, or urinal?: A Lot Help from another person bathing (including washing, rinsing, drying)?: A Lot Help from another person to put on and taking off regular upper body clothing?: A Lot Help from another person to put on and taking off regular lower body clothing?: A Lot 6 Click Score: 14    End of Session Equipment Utilized During Treatment: Gait belt  OT Visit Diagnosis: Other abnormalities of gait and mobility (R26.89);Pain Pain - Right/Left: Right Pain - part of body: Hip   Activity Tolerance Patient tolerated treatment well   Patient Left in chair;with call bell/phone within reach;with chair alarm set   Nurse Communication Mobility status        Time: 1610-9604 OT Time Calculation (min): 30 min  Charges: OT  General Charges $OT Visit: 1 Visit OT Treatments $Self Care/Home Management : 23-37 mins    Pilar Grammes 02/18/2017, 10:18 AM

## 2017-02-18 NOTE — Progress Notes (Signed)
Rehab admissions - I met with patient and I gave her rehab booklets.  She is interested in inpatient rehab admission.  I called and left message for her son, Kela Millin.  I spoke with dtr-in-law and explained inpatient rehab to her.  I will contact attending MD to see if patient is medically ready for CIR.  Call me for questions.  #[phone]

## 2017-02-18 NOTE — Progress Notes (Signed)
RN in and out catheterized pt and got 400cc of urine out.

## 2017-02-18 NOTE — Progress Notes (Signed)
Marcello FennelPatel, Ankit Anil, MD  Physician  Physical Medicine and Rehabilitation  Consult Note  Signed  Date of Service:  02/17/2017 11:44 AM       Related encounter: ED to Hosp-Admission (Discharged[facility]      Signed      Expand All Collapse All       [] Hide copied text  [] Hover for details        Physical Medicine and Rehabilitation Consult Reason for Consult: Decreased functional mobility Referring Physician: [facility]   HPI: Tawni MillersUdell S Cruickshank is a 84 y.o. right handed female with history of diabetes mellitus, CVA, hypertension. Per chart review and familyu, patient lives with her son, who can only provide supervision at discharge. One level home with ramped entrance. Assistance is needed. Used a rolling walker at times prior to admission. Presented 02/15/2017 after mechanical fall landing on her right hip. Denied any loss of consciousness dizziness or chest pain. X-rays and imaging revealed displaced right femoral neck fracture. Underwent right hip hemiarthroplasty anterior approach 02/15/2017 per Dr. Linna CapriceSwinteck. Hospital course pain management. Weightbearing as tolerated. NO active abduction. Acute blood loss anemia 6.6 plan transfusion. Patient remains on Plavix as prior to hospital admission. Physical and occupational therapy evaluations completed with recommendations of physical medicine rehabilitation consult.  Review of Systems  Constitutional: Negative for chills and fever.  HENT: Positive for hearing loss.   Eyes: Negative for blurred vision and double vision.  Respiratory: Negative for cough and shortness of breath.   Cardiovascular: Negative for chest pain and palpitations.  Gastrointestinal: Positive for constipation. Negative for nausea and vomiting.  Genitourinary: Negative for dysuria, flank pain and hematuria.  Musculoskeletal: Positive for back pain, falls and myalgias.  Skin: Negative for rash.    Neurological: Positive for dizziness and weakness.  All other systems reviewed and are negative.      Past Medical History:  Diagnosis Date  . Arthritis   . Diabetes mellitus type II 02/19/2011  . Dizziness   . Gout   . Gout spondylitis   . 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 Hypertension   . Sciatica   . Stroke [facility](H[facility])         Past Surgical History:  Procedure Laterality Date  . ABDOMINAL HYSTERECTOMY    . HIP PINNING,CANNULATED Right 02/15/2017   Procedure: ANTERIOR APPROACH HEMI HIP ARTHROPLASTY;  Surgeon: Samson FredericSwinteck, Brian, MD;  Location: [facility] OR;  Service: Orthopedics;  Laterality: Right;        Family History  Problem Relation Age of Onset  . Stroke Mother   . Stroke Father    Social History:  reports that  has never smoked. she has never used smokeless tobacco. She reports that she does not drink alcohol or use drugs. Allergies:       Allergies  Allergen Reactions  . Bee Venom Swelling  . Penicillins Other (See Comments)    Has patient had a PCN reaction causing immediate rash, facial/tongue/throat swelling, SOB or lightheadedness with hypotension: Unknown Has patient had a PCN reaction causing severe rash involving mucus membranes or skin necrosis: Unknown Has patient had a PCN reaction that required hospitalization: Unknown Has patient had a PCN reaction occurring within the last 10 years: Unknown If all of the above answers are "NO", then may proceed with Cephalosporin use.          Medications Prior to Admission  Medication Sig Dispense Refill  . allopurinol (ZYLOPRIM)  100 MG tablet Take 300 mg by mouth at bedtime.      Marland Kitchen aspirin EC 81 MG tablet Take 81 mg by mouth daily.    Marland Kitchen atorvastatin (LIPITOR) 20 MG tablet Take 1 tablet by mouth daily.    . clopidogrel (PLAVIX) 75 MG tablet Take 75 mg by mouth daily.     . Coenzyme Q10 (COQ10 PO) Take 100 mg by mouth daily.    . colchicine 0.6 MG  tablet Take 0.6 mg by mouth daily as needed. gout     . famotidine (PEPCID) 20 MG tablet Take 20 mg by mouth daily.     Marland Kitchen loratadine (CLARITIN) 10 MG tablet Take 10 mg by mouth daily.    . meclizine (ANTIVERT) 25 MG tablet Take 25 mg by mouth 3 (three) times daily as needed. For nausea    . tolterodine (DETROL LA) 2 MG 24 hr capsule Take 4 mg by mouth daily.     Marland Kitchen torsemide (DEMADEX) 10 MG tablet Take 10 mg by mouth daily.       Home: Home Living Family/patient expects to be discharged to:: Private residence Living Arrangements: Children Available Help at Discharge: Family, Available 24 hours/day Type of Home: House Home Access: Ramped entrance Home Layout: One level Bathroom Shower/Tub: Engineer, manufacturing systems: Standard Home Equipment: Environmental consultant - 2 wheels  Functional History: Prior Function Level of Independence: Needs assistance Gait / Transfers Assistance Needed: Difficult to determine but per PT pt was using RW at all times.  ADL's / Homemaking Assistance Needed: Pt very tangential and difficult to understand her answers to PLOF questions. Per physical therapist pt occasionally cooks but son mostly brings in food for her to heat up. She reports independence with bathing tasks but does wash-ups instead of stepping into her tub.  Functional Status:  Mobility: Bed Mobility Overal bed mobility: Needs Assistance Bed Mobility: Sit to Supine Supine to sit: Mod assist, +2 for physical assistance, HOB elevated Sit to supine: Mod assist, +2 for physical assistance General bed mobility comments: Assist to return B LE into bed.  Transfers Overall transfer level: Needs assistance Equipment used: Rolling walker (2 wheeled), 2 person hand held assist Transfers: Squat Pivot Transfers Sit to Stand: Max assist, +2 physical assistance, From elevated surface Squat pivot transfers: Max assist, +2 physical assistance General transfer comment: Unable to utilize RW to come into  full standing position. Utilized 2 person assist without RW and support under hips to squat-pivot from bed to chair.  Ambulation/Gait General Gait Details: Unable at this time.   ADL: ADL Overall ADL's : Needs assistance/impaired Eating/Feeding: Set up, Sitting Grooming: Supervision/safety, Sitting Upper Body Bathing: Moderate assistance, Sitting Lower Body Bathing: Moderate assistance, Sitting/lateral leans Upper Body Dressing : Moderate assistance, Sitting Lower Body Dressing: Moderate assistance, Sitting/lateral leans Toilet Transfer: Maximal assistance, +2 for physical assistance, Squat-pivot Toileting- Clothing Manipulation and Hygiene: Total assistance, Bed level Functional mobility during ADLs: Maximal assistance(squat-pivot only) General ADL Comments: Pt very limited by pain   Cognition: Cognition Overall Cognitive Status: Difficult to assess Orientation Level: Oriented X4 Cognition Arousal/Alertness: Awake/alert Behavior During Therapy: WFL for tasks assessed/performed Overall Cognitive Status: Difficult to assess General Comments: Pt difficult to understand. She reports thinking her son had arrived to take her home today and did not remember that she had already had surgery. She was not able to remember her home phone number and demonstrated difficulty problem solving at times. Pt easily distracted by her pain.   Blood pressure (!) 135/51,  pulse 84, temperature 97.8 F (36.6 C), temperature source Oral, resp. rate 18, height 4\' 10"  (1.473 m), weight 58.1 kg (128 lb), SpO2 92 %. Physical Exam  Vitals reviewed. Constitutional: She appears well-developed.  Frail  HENT:  Head: Normocephalic and atraumatic.  Eyes: EOM are normal. Left eye exhibits discharge.  Neck: Normal range of motion. Neck supple. No thyromegaly present.  Cardiovascular: Normal rate, regular rhythm and normal heart sounds.  Respiratory: Effort normal and breath sounds normal. No respiratory  distress.  +[place]  GI: Soft. Bowel sounds are normal. She exhibits no distension.  Musculoskeletal: She exhibits edema (LE). She exhibits no tenderness.  Neurological: She is alert.  Patient is hard of hearing.  Make good eye contact with examiner. Motor: B/l UE 5/5 proximal to distal B/l LE:  2/5 proximal to distal (right weaker than left with some pain inhibition) Follows commands inconsistently with repitition  Skin:  Hip incision is dressed. Appropriately tender  Psychiatric: Her affect is blunt. Her speech is delayed. She is slowed. She exhibits abnormal remote memory.  ?Dementia             Assessment/Plan: Diagnosis: Right THA Labs independently reviewed.  Records reviewed and summated above. Oral pharmacological pain control  Bowel program: consider colace, miralax and/or Senna. PRN suppository Fall precautions Monitor surgical wound and skin especially over pressure sensitive areas Prevent immobility complications: pressure ulcers, contractures, HO Consider modalities, such as TENs, CPM  1. Does the need for close, 24 hr/day medical supervision in concert with the patient's rehab needs make it unreasonable for this patient to be served in a less intensive setting? Yes  2. Co-Morbidities requiring supervision/potential complications: post-op pain management (Biofeedback training with therapies to help reduce reliance on opiate pain medications, monitor pain control during therapies, and sedation at rest and titrate to maximum efficacy to ensure participation and gains in therapies), acute blood loss anemia (transfuse again if necessary to ensure appropriate perfusion for increased activity tolerance), history of CVA, Tachycardia (monitor in accordance with pain and increasing activity), steroid induced hyperglycemia (Monitor in accordance with exercise and adjust meds as necessary), Thrombocytopenia (< 60,000/mm3 no resistive exercise), AKI on CKD (avoid nephrotoxic meds),  ?dementia 3. Due to safety, skin/wound care, disease management, pain management and patient education, does the patient require 24 hr/day rehab nursing? Yes 4. Does the patient require coordinated care of a physician, rehab nurse, PT (1-2 hrs/day, 5 days/week), OT (1-2 hrs/day, 5 days/week) and SLP (1-2 hrs/day, 5 days/week) to address physical and functional deficits in the context of the above medical diagnosis(es)? Yes Addressing deficits in the following areas: balance, endurance, locomotion, strength, transferring, bathing, dressing, toileting, cognition and psychosocial support 5. Can the patient actively participate in an intensive therapy program of at least 3 hrs of therapy per day at least 5 days per week? Potentially 6. The potential for patient to make measurable gains while on inpatient rehab is excellent 7. Anticipated functional outcomes upon discharge from inpatient rehab are min assist and mod assist  with PT, min assist and mod assist with OT, supervision with SLP. 8. Estimated rehab length of stay to reach the above functional goals is: 17-20 days. 9. Anticipated D/C setting: Other 10. Anticipated post D/C treatments: SNF 11. Overall Rehab/Functional Prognosis: good and fair  RECOMMENDATIONS: This patient's condition is appropriate for continued rehabilitative care in the following setting: CIR to decreases burden of care. Patient has agreed to participate in recommended program. Yes Note that insurance prior authorization may be  required for reimbursement for recommended care.  Comment: Rehab Admissions Coordinator to follow up.  Maryla Morrow, MD, ABPMR Mcarthur Rossetti Angiulli, PA-C 02/17/2017          Revision History

## 2017-02-18 NOTE — Progress Notes (Signed)
Trish Mage, RN  Rehab Admission Coordinator  Physical Medicine and Rehabilitation  PMR Pre-admission  Signed  Date of Service:  02/18/2017 2:43 PM       Related encounter: ED to Hosp-Admission (Discharged) from 02/14/2017 in [facility] [place] [facility]      Signed            [] Hide copied text  [] Hover for details   PMR Admission Coordinator Pre-Admission Assessment  Patient: [name] [name] is an 84 y.o., female MRN: [id number] DOB: Oct 12, 1917 Height: 4\' 10"  (147.3 cm) Weight: 58.1 kg (128 lb)                                                                                                                          Insurance Information HMO: No    PPO:       PCP:       IPA:       80/20:       OTHER:   PRIMARY:  Medicare A and B      Policy#: [id number]      Subscriber: patient CM Name:        Phone#:       Fax#:   Pre-Cert#:        Employer:  Retired Benefits:  Phone #:       Name:  Checked in passport one source [employer]. Date: 05/17/84     Deduct: $1364      Out of Pocket Max: none      Life Max: N/A CIR: 100%      SNF: 100 days Outpatient: 80%     Co-Pay: 20% Home Health: 100%      Co-Pay: none DME: 80%     Co-Pay: 20% Providers: patient's choice  SECONDARY: Medicaid [place] access      Policy#: [id number]      Subscriber: patient CM Name:        Phone#:       Fax#:   Pre-Cert#:        Employer:  Retired Benefits:  Phone #: [phone]     Name: Automated Eff. Date: Eligible 02/18/17 with coverage code [id number]     Deduct:        Out of Pocket Max:        Life Max:   CIR:        SNF:   Outpatient:       Co-Pay:   Home Health:        Co-Pay:   DME:       Co-Pay:    Emergency Contact Information        Contact Information    Name Relation Home Work Mobile   Wiemann,Wendell Son [phone]  [phone]   Boris Sharper [phone]  [phone]     Current Medical History  Patient Admitting Diagnosis:  R THR post fall with R  hip fracture  History of Present Illness: A 84 y.o.right handed femalewith history  ofdiet-controlleddiabetes mellituson no oral diabetic medication,CKDstage III,CVA, hypertension. Per chart reviewand family,patient lives with herson, who can only provide supervision at discharge. One level home with ramped entrance. Used a rolling walker at times prior to admission. Presented 02/15/2017 after mechanical fall landing on her right hip. Denied any loss of consciousness dizziness or chest pain. X-rays and imaging revealed displaced right femoral neck fracture. Underwent right hip hemiarthroplasty anterior approach 02/15/2017 per Dr. Linna Caprice. Hospital course pain management. Weightbearing as tolerated.NOactive abduction. Acute blood loss anemia 6.6transfuse with follow-up hemoglobin 10.5. Patient remains on Plavix as prior to hospital admissionas well as aspirinwhich wasincreased to 81 mg twice a day. Physical and occupational therapy evaluations completed with recommendations of physical medicine rehabilitation consult.Patient to be admitted for a comprehensive inpatient rehabilitation program.  Past Medical History      Past Medical History:  Diagnosis Date  . Arthritis   . Diabetes mellitus type II 02/19/2011  . Dizziness   . Gout   . Gout spondylitis   . 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 Hypertension   . Sciatica   . Stroke [facility])     Family History  family history includes Stroke in her father and mother.  Prior Rehab/Hospitalizations: No previous rehab.  Has the patient had major surgery during 100 days prior to admission? No  Current Medications   Current Facility-Administered Medications:  .  acetaminophen (TYLENOL) tablet 650 mg, 650 mg, Oral, Q6H PRN **OR** acetaminophen (TYLENOL) suppository 650 mg, 650 mg, Rectal, Q6H PRN, Swinteck, Arlys John, MD .  aspirin chewable tablet 81 mg, 81 mg, Oral, BID WC,  Swinteck, Arlys John, MD, 81 mg at 02/18/17 0959 .  atorvastatin (LIPITOR) tablet 20 mg, 20 mg, Oral, Daily, Bobette Mo, MD, 20 mg at 02/18/17 0959 .  clopidogrel (PLAVIX) tablet 75 mg, 75 mg, Oral, Daily, Swinteck, Arlys John, MD, 75 mg at 02/18/17 0959 .  famotidine (PEPCID) tablet 20 mg, 20 mg, Oral, Daily, Bobette Mo, MD, 20 mg at 02/18/17 1000 .  feeding supplement (ENSURE ENLIVE) (ENSURE ENLIVE) liquid 237 mL, 237 mL, Oral, BID BM, Narda Bonds, MD, 237 mL at 02/18/17 1217 .  fesoterodine (TOVIAZ) tablet 4 mg, 4 mg, Oral, Daily, Bobette Mo, MD, 4 mg at 02/18/17 0959 .  HYDROcodone-acetaminophen (NORCO/VICODIN) 5-325 MG per tablet 1-2 tablet, 1-2 tablet, Oral, Q6H PRN, Samson Frederic, MD, 1 tablet at 02/18/17 0553 .  loratadine (CLARITIN) tablet 10 mg, 10 mg, Oral, Daily, Bobette Mo, MD, 10 mg at 02/18/17 1000 .  meclizine (ANTIVERT) tablet 25 mg, 25 mg, Oral, TID PRN, Bobette Mo, MD .  menthol-cetylpyridinium (CEPACOL) lozenge 3 mg, 1 lozenge, Oral, PRN **OR** phenol (CHLORASEPTIC) mouth spray 1 spray, 1 spray, Mouth/Throat, PRN, Swinteck, Arlys John, MD .  metoCLOPramide (REGLAN) tablet 5-10 mg, 5-10 mg, Oral, Q8H PRN **OR** metoCLOPramide (REGLAN) injection 5-10 mg, 5-10 mg, Intravenous, Q8H PRN, Swinteck, Arlys John, MD .  morphine 2 MG/ML injection 0.5 mg, 0.5 mg, Intravenous, Q2H PRN, Swinteck, Arlys John, MD .  ondansetron (ZOFRAN) tablet 4 mg, 4 mg, Oral, Q6H PRN **OR** ondansetron (ZOFRAN) injection 4 mg, 4 mg, Intravenous, Q6H PRN, Samson Frederic, MD  Patients Current Diet: Diet Heart Room service appropriate? Yes; Fluid consistency: Thin Diet - low sodium heart healthy  Precautions / Restrictions Precautions Precautions: Fall, Anterior Hip Precaution Comments: no active abduction  Restrictions Weight Bearing Restrictions: Yes RLE Weight Bearing: Weight bearing as tolerated Other Position/Activity Restrictions: NO active ABDuction   Has the patient  had 2 or more falls or a fall with injury in the past year?No.  Reports one fall which resulted in current hospital admission.  Prior Activity Level Limited Community (1-2x/wk): Went to church on Sunday, went out 1 X a week.  Home Assistive Devices / Equipment Home Assistive Devices/Equipment: Eyeglasses, Dentures (specify type), Walker (specify type), Bedside commode/3-in-1, Grab bars in shower(front wheel walker) Home Equipment: Walker - 2 wheels  Prior Device Use: Indicate devices/aids used by the patient prior to current illness, exacerbation or injury? Walker and [employer]ane  Prior Functional Level Prior Function Level of Independence: Needs assistance Gait / Transfers Assistance Needed: Difficult to determine but per PT pt was using RW at all times.  ADL's / Homemaking Assistance Needed: Pt very tangential and difficult to understand her answers to PLOF questions. Per physical therapist pt occasionally cooks but son mostly brings in food for her to heat up. She reports independence with bathing tasks but does wash-ups instead of stepping into her tub.   Self Care: Did the patient need help bathing, dressing, using the toilet or eating?  Needed some help  Indoor Mobility: Did the patient need assistance with walking from room to room (with or without device)? Independent  Stairs: Did the patient need assistance with internal or external stairs (with or without device)? Needed some help  Functional Cognition: Did the patient need help planning regular tasks such as shopping or remembering to take medications? Independent  Current Functional Level Cognition  Overall Cognitive Status: Difficult to assess Orientation Level: Oriented X4 General Comments: Pt very difficult to understand at times.     Extremity Assessment (includes Sensation/Coordination)  Upper Extremity Assessment: Overall WFL for tasks assessed  Lower Extremity Assessment: RLE deficits/detail RLE Deficits /  Details: Acute pain, decreased strength and AROM consistent with above mentioned procedure. Noted general weakness in LLE as well.  RLE: Unable to fully assess due to pain    ADLs  Overall ADL's : Needs assistance/impaired Eating/Feeding: Set up, Sitting Grooming: Supervision/safety, Sitting Upper Body Bathing: Moderate assistance, Sitting Lower Body Bathing: Moderate assistance, Sitting/lateral leans Upper Body Dressing : Moderate assistance, Sitting Lower Body Dressing: Moderate assistance, Sitting/lateral leans Toilet Transfer: Maximal assistance, +2 for physical assistance, Squat-pivot Toilet Transfer Details (indicate cue type and reason): simulated with EOB> recliner. 1-2 times pt close to briefly achieving full standing but mostly in trunk flexion position. Difficulty taking pivotal steps but able to do so minimally.  Toileting- Clothing Manipulation and Hygiene: Total assistance, Bed level Functional mobility during ADLs: Maximal assistance(squat pivot only) General ADL Comments: Bed mobility and squat-pivot to recliner. successful on second transfer attempt after seated rest break.     Mobility  Overal bed mobility: Needs Assistance Bed Mobility: Supine to Sit Supine to sit: +2 for physical assistance, Mod assist, HOB elevated Sit to supine: Mod assist, +2 for physical assistance General bed mobility comments: Pt received sitting up in chair.     Transfers  Overall transfer level: Needs assistance Equipment used: Rolling walker (2 wheeled), 2 person hand held assist Transfer via Lift Equipment: Stedy Transfers: Sit to/from Stand Sit to Stand: Max assist, +2 physical assistance, From elevated surface Squat pivot transfers: Max assist, +2 physical assistance General transfer comment: Stedy utilized for sit<>stand x2 during session. Pt required max to stand but once standing, pt able to hold with min assist to min guard assist for up to 2 minutes. Pt attempted weight  shifting and able to reposition RLE somewhat on the foot plate  of the [place], but unable to shift weight enough to the R to reposition the LLE.     Ambulation / Gait / Stairs / Wheelchair Mobility  Ambulation/Gait General Gait Details: Unable at this time.     Posture / Balance Dynamic Sitting Balance Sitting balance - Comments: At least 1 UE support at all times for sitting balance. Trunk flexion.  Balance Overall balance assessment: Needs assistance Sitting-balance support: Feet supported, No upper extremity supported Sitting balance-Leahy Scale: Fair Sitting balance - Comments: At least 1 UE support at all times for sitting balance. Trunk flexion.  Standing balance support: Bilateral upper extremity supported, During functional activity Standing balance-Leahy Scale: Zero Standing balance comment: +2 required for standing balance.     Special needs/care consideration BiPAP/CPAP No CPM No Continuous Drip IV No  Dialysis No    Life Vest No Oxygen No Special Bed No Trach Size No Wound Vac (area) No      Skin Right hip post op incision with dressing                           Bowel mgmt: Last BM was prior to admission Bladder mgmt: Incontinence Diabetic mgmt No    Previous Home Environment Living Arrangements: Children Available Help at Discharge: Family, Available 24 hours/day Type of Home: House Home Layout: One level Home Access: Ramped entrance Bathroom Shower/Tub: Engineer, manufacturing systems: Standard Home Care Services: Yes Type of Home Care Services: Homehealth aide Home Care Agency (if known): [facility]  Discharge Living Setting Plans for Discharge Living Setting: Patient's home, House, Lives with (comment)(Son lives with patient.) Type of Home at Discharge: House Discharge Home Layout: Two level, Laundry or work area in basement, Able to live on main level with bedroom/bathroom Alternate Level Stairs-Number of Steps: Flight Discharge Home Access: Ramped  entrance(Has a ramp a front entrance.) Does the patient have any problems obtaining your medications?: No  Social/Family/Support Systems Patient Roles: Parent(Has 3 sons.) Contact Information: Aeon Koors - son - [phone] Anticipated Caregiver: Melvyn Neth - son - can only provide supervision.  May need SNF post inpatient rehab Ability/Limitations of Caregiver: Remi Haggard is disabled and does not work Engineer, structural Availability: 24/7 Discharge Plan Discussed with Primary Caregiver: Yes Is Caregiver In Agreement with Plan?: Yes Does Caregiver/Family have Issues with Lodging/Transportation while Pt is in Rehab?: No  Goals/Additional Needs Patient/Family Goal for Rehab: PT/OT min to mod assist goals Expected length of stay: 17-20 days Cultural Considerations: Baptist Dietary Needs: Heart diet, thin liquids Equipment Needs: TBD Pt/Family Agrees to Admission and willing to participate: Yes Program Orientation Provided & Reviewed with Pt/Caregiver Including Roles  & Responsibilities: Yes  Decrease burden of Care through IP rehab admission: Decrease number of caregivers, Bowel and bladder program and Other will see if she can progress sufficiently for discharge home.  Possible need for SNF placement upon discharge: Yes, likely will need SNF after rehab if she cannot progress to level where son can provide supervision at home.  Patient Condition: This patient's condition remains as documented in the consult dated 02/17/17, in which the Rehabilitation Physician determined and documented that the patient's condition is appropriate for intensive rehabilitative care in an inpatient rehabilitation facility. Will admit to inpatient rehab today.  Preadmission Screen Completed By:  Trish Mage, 02/18/2017 2:59 PM ______________________________________________________________________   Discussed status with Dr. Wynn Banker on 02/18/17 at 1459 and received telephone approval for admission  today.  Admission Coordinator:  Trish Mage,  time 1459/Date 02/18/17             Cosigned by: Erick Colace, MD at 02/18/2017 3:02 PM  Revision History

## 2017-02-19 ENCOUNTER — Inpatient Hospital Stay (HOSPITAL_COMMUNITY): Payer: Medicare Other | Admitting: Physical Therapy

## 2017-02-19 ENCOUNTER — Inpatient Hospital Stay (HOSPITAL_COMMUNITY): Payer: Medicare Other | Admitting: Occupational Therapy

## 2017-02-19 ENCOUNTER — Inpatient Hospital Stay (HOSPITAL_COMMUNITY)
Admission: RE | Admit: 2017-02-19 | Discharge: 2017-02-19 | Disposition: A | Discharge status: Home / Self Care | Payer: Medicare Other | Source: Intra-hospital | Attending: Physician Assistant | Admitting: Physician Assistant

## 2017-02-19 DIAGNOSIS — M7989 Other specified soft tissue disorders: Secondary | ICD-10-CM

## 2017-02-19 DIAGNOSIS — S72001D Fracture of unspecified part of neck of right femur, subsequent encounter for closed fracture with routine healing: Secondary | ICD-10-CM

## 2017-02-19 DIAGNOSIS — F05 Delirium due to known physiological condition: Secondary | ICD-10-CM

## 2017-02-19 LAB — URINALYSIS, ROUTINE W REFLEX MICROSCOPIC
Bilirubin Urine: NEGATIVE
Glucose, UA: NEGATIVE mg/dL
Ketones, ur: NEGATIVE mg/dL
NITRITE: NEGATIVE
PH: 6 (ref 5.0–8.0)
Protein, ur: 30 mg/dL — AB
Specific Gravity, Urine: 1.01 (ref 1.005–1.030)

## 2017-02-19 LAB — CBC WITH DIFFERENTIAL/PLATELET
Basophils Absolute: 0 10*3/uL (ref 0.0–0.1)
Basophils Relative: 0 %
Eosinophils Absolute: 0.2 10*3/uL (ref 0.0–0.7)
Eosinophils Relative: 2 %
HEMATOCRIT: 33.6 % — AB (ref 36.0–46.0)
HEMOGLOBIN: 10.7 g/dL — AB (ref 12.0–15.0)
LYMPHS ABS: 1.9 10*3/uL (ref 0.7–4.0)
LYMPHS PCT: 20 %
MCH: 27.9 pg (ref 26.0–34.0)
MCHC: 31.8 g/dL (ref 30.0–36.0)
MCV: 87.5 fL (ref 78.0–100.0)
MONOS PCT: 9 %
Monocytes Absolute: 0.8 10*3/uL (ref 0.1–1.0)
NEUTROS PCT: 69 %
Neutro Abs: 6.6 10*3/uL (ref 1.7–7.7)
Platelets: 145 10*3/uL — ABNORMAL LOW (ref 150–400)
RBC: 3.84 MIL/uL — AB (ref 3.87–5.11)
RDW: 16.1 % — ABNORMAL HIGH (ref 11.5–15.5)
WBC: 9.5 10*3/uL (ref 4.0–10.5)

## 2017-02-19 LAB — URINALYSIS, MICROSCOPIC (REFLEX)

## 2017-02-19 LAB — COMPREHENSIVE METABOLIC PANEL
ALT: 74 U/L — AB (ref 14–54)
AST: 77 U/L — ABNORMAL HIGH (ref 15–41)
Albumin: 3.2 g/dL — ABNORMAL LOW (ref 3.5–5.0)
Alkaline Phosphatase: 83 U/L (ref 38–126)
Anion gap: 9 (ref 5–15)
BILIRUBIN TOTAL: 1.3 mg/dL — AB (ref 0.3–1.2)
BUN: 31 mg/dL — ABNORMAL HIGH (ref 6–20)
CO2: 23 mmol/L (ref 22–32)
Calcium: 8.6 mg/dL — ABNORMAL LOW (ref 8.9–10.3)
Chloride: 109 mmol/L (ref 101–111)
Creatinine, Ser: 1.31 mg/dL — ABNORMAL HIGH (ref 0.44–1.00)
GFR, EST AFRICAN AMERICAN: 38 mL/min — AB (ref >60)
GFR, EST NON AFRICAN AMERICAN: 33 mL/min — AB (ref >60)
Glucose, Bld: 136 mg/dL — ABNORMAL HIGH (ref 65–99)
Potassium: 4.5 mmol/L (ref 3.5–5.1)
Sodium: 141 mmol/L (ref 135–145)
Total Protein: 6.3 g/dL — ABNORMAL LOW (ref 6.5–8.1)

## 2017-02-19 LAB — GLUCOSE, CAPILLARY: Glucose-Capillary: 121 mg/dL — ABNORMAL HIGH (ref 65–99)

## 2017-02-19 MED ORDER — BISACODYL 10 MG RE SUPP
10.0000 mg | Freq: Every day | RECTAL | Status: DC | PRN
  Administered 2017-02-19: 10 mg via RECTAL
  Filled 2017-02-19: qty 1

## 2017-02-19 MED ORDER — QUETIAPINE FUMARATE 25 MG PO TABS
25.0000 mg | ORAL_TABLET | Freq: Every day | ORAL | Status: DC
  Administered 2017-02-19 – 2017-02-23 (×5): 25 mg via ORAL
  Filled 2017-02-19 (×5): qty 1

## 2017-02-19 MED ORDER — TRAMADOL HCL 50 MG PO TABS
50.0000 mg | ORAL_TABLET | Freq: Four times a day (QID) | ORAL | Status: DC | PRN
  Administered 2017-02-20 – 2017-02-28 (×9): 50 mg via ORAL
  Filled 2017-02-19 (×10): qty 1

## 2017-02-19 NOTE — Progress Notes (Signed)
Social Work Assessment and Plan Social Work Assessment and Plan  Patient Details  Name: [name] [name] MRN: [id number] Date of Birth: 09/01/1917  Today's Date: 02/19/2017  Problem List:  Patient Active Problem List   Diagnosis Date Noted  . Femoral neck fracture (HCC) 02/18/2017  . H/O: CVA (cerebrovascular accident)   . Post-operative pain   . Acute blood loss anemia   . Tachycardia   . Steroid-induced hyperglycemia   . Thrombocytopenia (HCC)   . Closed right hip fracture, initial encounter (HCC) 02/15/2017  . Atelectasis of left lung 02/15/2017  . Displaced fracture of right femoral neck (HCC) 02/15/2017  . Nausea and vomiting 03/09/2011  . Diabetes mellitus, type II (HCC) 03/09/2011  . Hyperkalemia 03/09/2011  . Acute renal failure (HCC) 03/09/2011  . Hypotension 03/09/2011  . Anemia 03/09/2011  . Coagulopathy (HCC) 03/09/2011  . HTN (hypertension) 02/19/2011  . Dizziness 02/19/2011  . Cerebellar infarct (HCC) 02/19/2011  . Bradycardia 02/19/2011  . H/O: CVA (cardiovascular accident) 02/19/2011  . SPONDYLOSIS 09/18/2009  . SCIATICA 09/18/2009  . SCOLIOSIS-IDIOPATHIC 09/18/2009  . SPONDYLOLITHESIS 09/18/2009   Past Medical History:  Past Medical History:  Diagnosis Date  . Arthritis   . Diabetes mellitus type II 02/19/2011  . Dizziness   . Gout   . Gout spondylitis   . 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 Hypertension   . Sciatica   . Stroke [facility])    Past Surgical History:  Past Surgical History:  Procedure Laterality Date  . ABDOMINAL HYSTERECTOMY    . HIP PINNING,CANNULATED Right 02/15/2017   Procedure: ANTERIOR APPROACH HEMI HIP ARTHROPLASTY;  Surgeon: Samson Frederic, MD;  Location: [facility] OR;  Service: Orthopedics;  Laterality: Right;   Social History:  reports that  has never smoked. she has never used smokeless tobacco. She reports that she does not drink alcohol or use drugs.  Family / Support Systems Marital  Status: Widow/Widower Patient Roles: Parent ChildrenRemi Haggard [phone]  [phone] Other Supports: [employer] [phone] Anticipated Caregiver: Wandra Scot only provide supervision level due to his own health issues Ability/Limitations of Caregiver: Lives with lewis but limited in his ability to provide care to Mom Caregiver Availability: 24/7(supervision level only) Family Dynamics: Pt has five children and numerous grandchildren who are involved and supportive. She lives with Melvyn Neth who is disabled himself and can only do so much for her. She has some church members who visit and see when they can. Pt has outlived most of her friends and church members.  Social History Preferred language: English Religion:  Cultural Background: No issues Education: Some school Read: Yes(limited) Write: Yes(limited) Employment Status: Retired Fish farm manager Issues: No issues Guardian/Conservator: None-according to MD pt is capable of making her own decisions while here, will make sure Remi Haggard is here due to ptr not always oriented   Abuse/Neglect Abuse/Neglect Assessment Can Be Completed: Yes Physical Abuse: Denies Verbal Abuse: Denies Sexual Abuse: Denies Exploitation of patient/patient's resources: Denies Self-Neglect: Denies  Emotional Status Pt's affect, behavior adn adjustment status: Pt is motivated and will try to do what is asked of her, she is not always sure what she needs to do but will try. Pt does have dementia and not able at times to follow directions. Lewis reports she could mover around the house with a walker and spnged bathed,. She would need to get back to this to go home with him. Recent Psychosocial Issues: other health issues-since CVA in 2013 has cognitive deficits, son provide supervision  level and does the home mangement Pyschiatric History: No history-deferred depression due to pt not able to complete and is not oriented at this time,  not sure why she is here and reason she is here. Will reassess while here and see once here and adjusted to unit is better oriented. Substance Abuse History: No issues  Patient / Family Perceptions, Expectations & Goals Pt/Family understanding of illness & functional limitations: Lewis can explain her hip fracture and has spoken with the MD and feels he has a basic understanding of her treatment plan. Pt does not seem to realize why she is here and what has happened to her. Will continue to discuss and see if she becomes more oriented the longer she is here. Premorbid pt/family roles/activities: Mom, grandmother, church member, retiree, etc Anticipated changes in roles/activities/participation: resume Pt/family expectations/goals: Pt states: " How are you? " She laughs and smiles a lot, seems happy. Lewis states: " I hope she does well here and I can take him home."  [employer] Community Agencies: Other (Comment)(had in past) Premorbid Home Care/DME Agencies: Other (Comment)(had in past) Transportation available at discharge: Family members  Discharge Planning Living Arrangements: Children Support Systems: Children, Other relatives, Church/faith community Type of Residence: Private residence Insurance Resources: [employer]Medicare, [employer] EnergyMedicaid (specify county) Architectinancial Resources: Tree surgeonocial Security, Family Support Financial Screen Referred: No Living Expenses: Lives with family Money Management: Family Does the patient have any problems obtaining your medications?: No Home Management: Lewis or other family members do Patient/Family Preliminary Plans: Discharge plan depends upon how well she does here and how much care she requires. Lewis can not provide physical care to Mom and she would need to go to a  NH if she does upon discharge from here. Will await team's evaluations and work on a safe plan for pt and son. Sw Barriers to Discharge: Decreased caregiver support Sw Barriers to Discharge  Comments: Lewis can not provide physical care due to own health issues Social Work Anticipated Follow Up Needs: HH/OP, SNF  Clinical Impression Pleasant elderly lady who smiles and giggles a lot. Posey ReaUnsure is she realizes why she is here and for what. Her children are involved and supportive, she lives with her son Melvyn NethLewis. She would need to be supervision to be able to return home due to [facility] issues. Will await team's evaluations and work on a safe plan. May become more oriented as she adjusts to the new unit and sleeps better at night. Work on a plan for her with children.  Lucy Chrisupree, Everline Mahaffy G 02/19/2017, 2:03 PM

## 2017-02-19 NOTE — Discharge Summary (Signed)
[facility] Discharge Summary   Patient: [name] [name] JYN:[id number]   PCP: Reynolds Bowl, MD DOB: 01/03/1918   Date of admission: 02/14/2017   Date of discharge: 02/18/2017    Discharge Diagnoses:  Principal Problem:   Closed right hip fracture, initial encounter (HCC) Active Problems:   HTN (hypertension)   Diabetes mellitus, type II (HCC)   Anemia   H/O: CVA (cardiovascular accident)   Atelectasis of left lung   Displaced fracture of right femoral neck (HCC)   H/O: CVA (cerebrovascular accident)   Post-operative pain   Acute blood loss anemia   Tachycardia   Steroid-induced hyperglycemia   Thrombocytopenia (HCC)   Admitted From: home Disposition:  CIR  Recommendations for Outpatient Follow-up:  1. Follow-up with PCP in 1 week, follow-up with orthopedic as recommended.  Follow-up Information    Swinteck, Arlys John, MD. Schedule an appointment as soon as possible for a visit in 2 week(s).   Specialty:  Orthopedic Surgery Why:  For wound re-check Contact information: [address] [place] [address] [phone]        Reynolds Bowl, MD. Schedule an appointment as soon as possible for a visit in 1 week(s).   Specialty:  Family Medicine Contact information: [address] [place] [address] [phone]          Diet recommendation: cardiac diet  Activity: The patient is advised to gradually reintroduce usual activities.  Discharge Condition: good  Code Status: full code  History of present illness: As per the H and P dictated on admission, "[name] [name] is a 84 y.o. female with medical history significant of osteoarthritis, type 2 diabetes, dizziness, gout, gouty spondylitis, history of CVA, hypertension, back pain, sciatica who is brought to the emergency department after having a mechanical fall at home hitting her right knee.  She initially refused transport to the emergency department, but subsequently decided to,  after the knee became swollen and painful.  She denies fever, chills, headaches, dizziness, dyspnea, chest pain, palpitations, diaphoresis, PND orthopnea.  No pitting edema of the lower extremities, however the patient has lymphedema.  Denies abdominal pain, nausea, emesis, diarrhea, constipation, melena or hematochezia.  She denies dysuria, frequency or hematuria.  ED Course: Initial vital signs in the emergency department temperature 36.4C, (97.6 F) pulse 59, respirations 16, blood pressure 130/62 mmHg and O2 sat 98% on room air.  Patient received fentanyl 25 mg in the emergency department.  Her workup shows a CBC with a white count of 6.8 with normal differential, hemoglobin 10.2 g/dL and platelets 440.  Her last hemoglobin on record is also 10.2 g/dL almost 6 years ago.  Her BMP shows a glucose of 152, BUN of 22 and creatinine of 1.21 mg/dL.  Electrolytes are normal.  Imaging: Right hip/femur shows probably impacted transverse subcapital fracture of the right proximal femur no displacement no dislocation.  Her chest radiograph showed left lower lobe atelectasis versus infiltrate.  Please see images and full radiology report for further detail."  Hospital Course:  Summary of her active problems in the hospital is as following. Closed right hip fracture Patient is s/p right hip arthroplasty on 02/15/2017. -PT recommendations: CIR -Orthopedic surgery recommendations  Essential hypertension Normotensive  Anemia Acute blood loss anemia No bleeding. Hemoglobin  went down to 6.7 with mild improvement to 6.9 on repeat. -2 units PRBC H&H remained stable after transfusion. Continue to monitor and recheck in 1 week with PCP.  Thrombocytopenia In setting of acute blood loss.  Stable.  History of CVA -Continue atorvastatin, aspirin and Plavix  Left lung atelectasis -Incentive spirometer  Acute kidney injury Likely secondary to acute blood loss and hypovolemia. IV fluids were  infiltrating into patient's arm Remained with IV fluids as well as PRBC.  All other chronic medical condition were stable during the hospitalization.  Patient was seen by physical therapy, who recommended CIR, which was arranged by Child psychotherapist and case Production designer, theatre/television/film. On the day of the discharge the patient's vitals were stable, and no other acute medical condition were reported by patient. the patient was felt safe to be discharge at [facility] with therapy.  Procedures and Results:  Right hip arthroplasty  Consultations:  Orthopedic surgery  DISCHARGE MEDICATION: Allergies as of 02/18/2017      Reactions   Bee Venom Swelling   Penicillins Other (See Comments)   Has patient had a PCN reaction causing immediate rash, facial/tongue/throat swelling, SOB or lightheadedness with hypotension: Unknown Has patient had a PCN reaction causing severe rash involving mucus membranes or skin necrosis: Unknown Has patient had a PCN reaction that required hospitalization: Unknown Has patient had a PCN reaction occurring within the last 10 years: Unknown If all of the above answers are "NO", then may proceed with Cephalosporin use.      Medication List    STOP taking these medications   aspirin EC 81 MG tablet Replaced by:  aspirin 81 MG chewable tablet     TAKE these medications   allopurinol 100 MG tablet Commonly known as:  ZYLOPRIM Take 300 mg by mouth at bedtime.   aspirin 81 MG chewable tablet Chew 1 tablet (81 mg total) by mouth 2 (two) times daily with a meal. Replaces:  aspirin EC 81 MG tablet   atorvastatin 20 MG tablet Commonly known as:  LIPITOR Take 1 tablet by mouth daily.   clopidogrel 75 MG tablet Commonly known as:  PLAVIX Take 75 mg by mouth daily.   colchicine 0.6 MG tablet Take 0.6 mg by mouth daily as needed. gout   COQ10 PO Take 100 mg by mouth daily.   famotidine 20 MG tablet Commonly known as:  PEPCID Take 20 mg by mouth daily.   feeding supplement (ENSURE  ENLIVE) Liqd Take 237 mLs by mouth 2 (two) times daily between meals.   HYDROcodone-acetaminophen 5-325 MG tablet Commonly known as:  NORCO/VICODIN Take 1-2 tablets by mouth every 6 (six) hours as needed (prn hip pain).   loratadine 10 MG tablet Commonly known as:  CLARITIN Take 10 mg by mouth daily.   meclizine 25 MG tablet Commonly known as:  ANTIVERT Take 25 mg by mouth 3 (three) times daily as needed. For nausea   tolterodine 2 MG 24 hr capsule Commonly known as:  DETROL LA Take 4 mg by mouth daily.   torsemide 10 MG tablet Commonly known as:  DEMADEX Take 1 tablet (10 mg total) by mouth daily as needed. What changed:    when to take this  reasons to take this      Allergies  Allergen Reactions  . Bee Venom Swelling  . Penicillins Other (See Comments)    Has patient had a PCN reaction causing immediate rash, facial/tongue/throat swelling, SOB or lightheadedness with hypotension: Unknown Has patient had a PCN reaction causing severe rash involving mucus membranes or skin necrosis: Unknown Has patient had a PCN reaction that required hospitalization: Unknown Has patient had a PCN reaction occurring within the last 10 years: Unknown If all of the above  answers are "NO", then may proceed with Cephalosporin use.    Discharge Instructions    Diet - low sodium heart healthy   Complete by:  As directed    Discharge instructions   Complete by:  As directed    It is important that you read following instructions as well as go over your medication list with RN to help you understand your care after this hospitalization.  Discharge Instructions: Please follow-up with PCP in one week  Please request your primary care physician to go over all Hospital Tests and Procedure/Radiological results at the follow up,  Please get all Hospital records sent to your PCP by signing hospital release before you go home.   Do not take more than prescribed Pain, Sleep and Anxiety  Medications. You were cared for by a hospitalist during your hospital stay. If you have any questions about your discharge medications or the care you received while you were in the hospital after you are discharged, you can call the unit and ask to speak with the hospitalist on call if the hospitalist that took care of you is not available.  Once you are discharged, your primary care physician will handle any further medical issues. Please note that NO REFILLS for any discharge medications will be authorized once you are discharged, as it is imperative that you return to your primary care physician (or establish a relationship with a primary care physician if you do not have one) for your aftercare needs so that they can reassess your need for medications and monitor your lab values. You Must read complete instructions/literature along with all the possible adverse reactions/side effects for all the Medicines you take and that have been prescribed to you. Take any new Medicines after you have completely understood and accept all the possible adverse reactions/side effects. Wear Seat belts while driving. If you have smoked or chewed Tobacco in the last 2 yrs please stop smoking and/or stop any Recreational drug use.   Increase activity slowly   Complete by:  As directed      Discharge Exam: Filed Weights   02/14/17 2147  Weight: 58.1 kg (128 lb)   Vitals:   02/18/17 0415 02/18/17 1300  BP: (!) 142/57 (!) 118/56  Pulse: 86 88  Resp: 16 18  Temp: 98.7 F (37.1 C) 97.7 F (36.5 C)  SpO2: 93% 95%   General: Appear in no distress, no Rash; Oral Mucosa moist. Cardiovascular: S1 and S2 Present, no Murmur, no JVD Respiratory: Bilateral Air entry present and Clear to Auscultation, no Crackles, no wheezes Abdomen: Bowel Sound present, Soft and no tenderness Extremities: no Pedal edema, no calf tenderness Neurology: Grossly no focal neuro deficit.  The results of significant diagnostics from  this hospitalization (including imaging, microbiology, ancillary and laboratory) are listed below for reference.    Significant Diagnostic Studies: Dg Chest 2 View  Result Date: 02/15/2017 CLINICAL DATA:  Right leg pain after a fall today. History of diabetes, hypertension, stroke EXAM: CHEST  2 VIEW COMPARISON:  03/09/2011 FINDINGS: Shallow inspiration. Heart size and pulmonary vascularity are normal for technique. Suggestion of infiltration or atelectasis in the left lung base behind the heart. No blunting of costophrenic angles. No pneumothorax. Calcification of the aorta. Degenerative changes in the spine and shoulders. Mediastinal contours appear intact. IMPRESSION: Infiltration or atelectasis in the left lung base behind the heart. Aortic atherosclerosis. Electronically Signed   By: Burman Nieves M.D.   On: 02/15/2017 00:09   Pelvis Portable  Result Date: 02/16/2017 CLINICAL DATA:  Right hip hemiarthroplasty. EXAM: PORTABLE PELVIS 1-2 VIEWS COMPARISON:  Intra op of fluoroscopy 02/15/2017 FINDINGS: Right hip hemiarthroplasty using non cemented component. Cerclage wire at the trochanteric region. Arthroplasty component appears well seated. No evidence of acute fracture or dislocation. Soft tissue gas consistent with recent surgery. Vascular calcifications. Incidental note of degenerative changes in the left hip. IMPRESSION: Postoperative right hip hemiarthroplasty. Component appears well seated. No acute complication is suggested. Electronically Signed   By: Burman NievesWilliam  Stevens M.D.   On: 02/16/2017 00:34   Dg Knee Complete 4 Views Right  Result Date: 02/14/2017 CLINICAL DATA:  Acute onset of right knee pain and swelling. EXAM: RIGHT KNEE - COMPLETE 4+ VIEW COMPARISON:  None. FINDINGS: There is no evidence of fracture or dislocation. There is significant narrowing at the lateral compartment, with associated sclerosis. Cortical irregularity is noted at all 3 compartments. Prominent marginal  osteophytes are seen at all 3 compartments, and along the tibial spine. Wall osteophytes are also seen. There is chronic flattening of the patella. Trace knee joint fluid remains within normal limits. Mild edema is noted at the level of the patellar tendon. IMPRESSION: 1. No evidence of fracture or dislocation. 2. Prominent tricompartmental osteoarthritis, most significant at the lateral compartment. Electronically Signed   By: Roanna RaiderJeffery  Chang M.D.   On: 02/14/2017 22:56   Dg C-arm 61-120 Min  Result Date: 02/15/2017 CLINICAL DATA:  Right hip arthroplasty. EXAM: OPERATIVE RIGHT HIP (WITH PELVIS IF PERFORMED) 2 VIEWS TECHNIQUE: Fluoroscopic spot image(s) were submitted for interpretation post-operatively. COMPARISON:  None. FINDINGS: 13 seconds of fluoroscopic time utilized for right bipolar uncemented hip arthroplasty with single cerclage wire noted. IMPRESSION: Fluoroscopic time utilized for right hip bipolar arthroplasty. No immediate intraoperative complications identified. Electronically Signed   By: Tollie Ethavid  Kwon M.D.   On: 02/15/2017 20:51   Dg Hip Operative Unilat W Or W/o Pelvis Right  Result Date: 02/15/2017 CLINICAL DATA:  Right hip arthroplasty. EXAM: OPERATIVE RIGHT HIP (WITH PELVIS IF PERFORMED) 2 VIEWS TECHNIQUE: Fluoroscopic spot image(s) were submitted for interpretation post-operatively. COMPARISON:  None. FINDINGS: 13 seconds of fluoroscopic time utilized for right bipolar uncemented hip arthroplasty with single cerclage wire noted. IMPRESSION: Fluoroscopic time utilized for right hip bipolar arthroplasty. No immediate intraoperative complications identified. Electronically Signed   By: Tollie Ethavid  Kwon M.D.   On: 02/15/2017 20:51   Dg Hip Unilat W Or Wo Pelvis 2-3 Views Right  Result Date: 02/15/2017 CLINICAL DATA:  Right leg pain after fall today. History of diabetes, hypertension, stroke. EXAM: DG HIP (WITH OR WITHOUT PELVIS) 2-3V RIGHT COMPARISON:  None. FINDINGS: Diffuse bone  demineralization. Degenerative changes in the lower lumbar spine and hips. Focal cortical irregularity and slight lucency along the subcapital right femoral neck consistent with a mildly impacted fracture. No displacement or dislocation. Pelvis appears intact. SI joints and symphysis pubis are not displaced. Calcification adjacent to the pubic symphysis consistent with osteitis pubis. Vascular calcifications. IMPRESSION: Probable impacted transverse subcapital fracture of the right proximal femur. No displacement or dislocation. Electronically Signed   By: Burman NievesWilliam  Stevens M.D.   On: 02/15/2017 00:06   Dg Femur Min 2 Views Right  Result Date: 02/15/2017 CLINICAL DATA:  Right leg pain after a fall today. EXAM: RIGHT FEMUR 2 VIEWS COMPARISON:  None. FINDINGS: Diffuse bone demineralization. Cortical irregularity with vague linear lucency across a subcapital femoral neck consistent with nondisplaced impacted fracture. Femoral shaft appears intact. Severe degenerative changes in the right knee with lateral greater than  medial compartment narrowing, osteophyte formation, and cartilaginous calcification. No significant effusion. Vascular calcifications in the soft tissues. IMPRESSION: Probable impacted subcapital fracture of the proximal right femur. Degenerative changes in the knee joint. No additional fractures identified. Electronically Signed   By: Burman Nieves M.D.   On: 02/15/2017 00:08    Microbiology: No results found for this or any previous visit (from the past 240 hour(s)).   Labs: CBC: Recent Labs  Lab 02/14/17 2356 02/16/17 0436 02/17/17 0452 02/17/17 0915 02/18/17 0916 02/19/17 0536  WBC 6.8 5.6 8.0  --  8.0 9.5  NEUTROABS 5.5  --   --   --   --  6.6  HGB 10.2* 7.8* 6.7* 6.9* 10.5* 10.7*  HCT 32.4* 24.3* 20.2* 21.4* 31.6* 33.6*  MCV 89.8 89.3 86.3  --  87.8 87.5  PLT 158 105* 113*  --  115* 145*   Basic Metabolic Panel: Recent Labs  Lab 02/14/17 2356 02/16/17 0436  02/17/17 0452 02/18/17 0916 02/19/17 0536  NA 144 143 139 141 141  K 3.8 4.1 4.6 4.7 4.5  CL 104 108 106 112* 109  CO2 28 23 24  21* 23  GLUCOSE 152* 173* 130* 152* 136*  BUN 22* 16 30* 31* 31*  CREATININE 1.21* 1.18* 1.78* 1.47* 1.31*  CALCIUM 9.0 8.5* 8.0* 8.1* 8.6*   Liver Function Tests: Recent Labs  Lab 02/19/17 0536  AST 77*  ALT 74*  ALKPHOS 83  BILITOT 1.3*  PROT 6.3*  ALBUMIN 3.2*   No results for input(s): LIPASE, AMYLASE in the last 168 hours. No results for input(s): AMMONIA in the last 168 hours. Cardiac Enzymes: No results for input(s): CKTOTAL, CKMB, CKMBINDEX, TROPONINI in the last 168 hours. BNP (last 3 results) No results for input(s): BNP in the last 8760 hours. CBG: Recent Labs  Lab 02/17/17 1642 02/18/17 0044 02/18/17 0619 02/18/17 1118 02/18/17 2122  GLUCAP 126* 123* 113* 171* 145*   Time spent: 35 minutes  Signed:  Lynden Oxford  [facility] 02/18/2017 4:12 PM

## 2017-02-19 NOTE — Evaluation (Signed)
Occupational Therapy Assessment and Plan  Patient Details  Name: RAYNEISHA BOUZA MRN: [id number] Date of Birth: 12-Oct-1917  OT Diagnosis: acute pain, cognitive deficits, muscle weakness (generalized), pain in joint and swelling of limb Rehab Potential: Rehab Potential (ACUTE ONLY): Good ELOS: 17-19 days   Today's Date: 02/19/2017 OT Individual Time: 1300-1401 OT Individual Time Calculation (min): 61 min     Problem List:  Patient Active Problem List   Diagnosis Date Noted  . Femoral neck fracture ([place]) 02/18/2017  . H/O: CVA (cerebrovascular accident)   . Post-operative pain   . Acute blood loss anemia   . Tachycardia   . Steroid-induced hyperglycemia   . Thrombocytopenia ([place])   . Closed right hip fracture, initial encounter ([place]) 02/15/2017  . Atelectasis of left lung 02/15/2017  . Displaced fracture of right femoral neck ([place]) 02/15/2017  . Nausea and vomiting 03/09/2011  . Diabetes mellitus, type II ([place]) 03/09/2011  . Hyperkalemia 03/09/2011  . Acute renal failure ([place]) 03/09/2011  . Hypotension 03/09/2011  . Anemia 03/09/2011  . Coagulopathy ([place]) 03/09/2011  . HTN (hypertension) 02/19/2011  . Dizziness 02/19/2011  . Cerebellar infarct ([place]) 02/19/2011  . Bradycardia 02/19/2011  . H/O: CVA (cardiovascular accident) 02/19/2011  . SPONDYLOSIS 09/18/2009  . SCIATICA 09/18/2009  . SCOLIOSIS-IDIOPATHIC 09/18/2009  . SPONDYLOLITHESIS 09/18/2009    Past Medical History:  Past Medical History:  Diagnosis Date  . Arthritis   . Diabetes mellitus type II 02/19/2011  . Dizziness   . Gout   . Gout spondylitis   . 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 Hypertension   . Sciatica   . Stroke [facility])    Past Surgical History:  Past Surgical History:  Procedure Laterality Date  . ABDOMINAL HYSTERECTOMY    . HIP PINNING,CANNULATED Right 02/15/2017   Procedure: ANTERIOR APPROACH HEMI HIP ARTHROPLASTY;  Surgeon: Rod Can, MD;   Location: [facility];  Service: Orthopedics;  Laterality: Right;    Assessment & Plan Clinical Impression: Patient is a 84 y.o. year old female with recent admission to the hospital on 02/15/2017 after mechanical fall landing on her right hip. Denied any loss of consciousness dizziness or chest pain. X-rays and imaging revealed displaced right femoral neck fracture. Underwent right hip hemiarthroplasty anterior approach 02/15/2017 per Dr. Lyla Glassing. Hospital course pain management. Weightbearing as tolerated.NOactive abduction.  Patient transferred to CIR on 02/18/2017 .    Patient currently requires total with basic self-care skills secondary to muscle weakness, decreased awareness, decreased problem solving, decreased safety awareness and decreased memory and decreased standing balance, decreased balance strategies and difficulty maintaining precautions.  Prior to hospitalization, patient could complete ADLs with modified independent .  Patient will benefit from skilled intervention to decrease level of assist with basic self-care skills prior to discharge home with care partner.  Anticipate patient will require minimal physical assistance and follow up home health.  OT - End of Session Activity Tolerance: Decreased this session Endurance Deficit: Yes OT Assessment Rehab Potential (ACUTE ONLY): Good OT Barriers to Discharge: Decreased caregiver support OT Barriers to Discharge Comments: Pt's son cannot physically assist per chart, will follow-up. OT Patient demonstrates impairments in the following area(s): Balance;Pain;Edema;Cognition;Safety OT Basic ADL's Functional Problem(s): Grooming;Bathing;Dressing;Toileting OT Transfers Functional Problem(s): Toilet;Tub/Shower OT Plan OT Intensity: Minimum of 1-2 x/day, 45 to 90 minutes OT Frequency: 5 out of 7 days OT Duration/Estimated Length of Stay: 17-19 days OT Treatment/Interventions: Balance/vestibular training;Cognitive  remediation/compensation;Community reintegration;DME/adaptive equipment instruction;Discharge planning;Pain management;Self Care/advanced ADL retraining;Therapeutic Activities;UE/LE Coordination  activities;Patient/family education;Therapeutic Exercise;Functional mobility training;UE/LE Strength taining/ROM OT Self Feeding Anticipated Outcome(s): independent OT Basic Self-Care Anticipated Outcome(s): min assist OT Toileting Anticipated Outcome(s): min assist OT Bathroom Transfers Anticipated Outcome(s): min assist OT Recommendation Patient destination: Home Follow Up Recommendations: 24 hour supervision/assistance Equipment Recommended: 3 in 1 bedside comode   Skilled Therapeutic Intervention Pt completed selfcare retraining sit to stand from the EOB.  She needed max assist for transition from supine to sit.  Once sitting EOB she was able to maintain sitting while bathing with close supervision.  She was unable to complete washing her feet secondary to decreased flexibility.  She needed total assist +2 (pt 25%) for sit to stand to pull pants over hips after therapist assisted with threading them.  Total assist also needed for squat pivot transfer to the right to wheelchair.  Pt left in wheelchair with call button and phone in reach.  Safety belt in place.    OT Evaluation Precautions/Restrictions  Precautions Precautions: Fall;Anterior Hip Precaution Comments: no active abduction  Restrictions Weight Bearing Restrictions: Yes RLE Weight Bearing: Weight bearing as tolerated  Pain Pain Assessment Pain Assessment: Faces Pain Score: Asleep Faces Pain Scale: Hurts little more Pain Type: Acute pain Pain Location: Hip Pain Orientation: Right Pain Descriptors / Indicators: Discomfort;Grimacing Pain Onset: With Activity Pain Intervention(s): Repositioned;RN made aware Home Living/Prior [facility] expects to be discharged to:: Private residence Living  Arrangements: Children Available Help at Discharge: Family, Available 24 hours/day Type of Home: House Home Access: Ramped entrance Home Layout: One level Bathroom Shower/Tub: Chiropodist: Standard  Lives With: Son IADL History Current License: No Occupation: Retired Prior Function Level of Independence: Independent with basic ADLs  Able to [place]?: Yes Driving: No Vocation: Retired Comments: Pt used cane PTA for for all mobility   ADL  See Function Section of chart for details  Vision Baseline Vision/History: Wears glasses Wears Glasses: At all times Patient Visual Report: No change from baseline Perception  Perception: Within Functional Limits Praxis Praxis: Intact Cognition Overall Cognitive Status: History of cognitive impairments - at baseline Arousal/Alertness: Awake/alert Orientation Level: Person;Place;Situation Person: Oriented Place: Disoriented Situation: Disoriented Year: Other (Comment)(1981) Month: April Day of Week: Incorrect Memory: Impaired Memory Impairment: Storage deficit;Decreased recall of new information Immediate Memory Recall: Sock;Blue;Bed Memory Recall: Blue Attention: Sustained Sustained Attention: Appears intact Awareness: Impaired Awareness Impairment: Intellectual impairment Problem Solving: Impaired Problem Solving Impairment: Verbal basic Safety/Judgment: Impaired Comments: Pt with no awareness of hip precaution to not abduct her leg.  Earlier attempts to transfer OOB without assistance secondary to confusion. Sensation Sensation Light Touch: Appears Intact Stereognosis: Appears Intact Hot/Cold: Appears Intact Proprioception: Appears Intact Additional Comments: Sensation intact in BUEs per gross assessment Coordination Gross Motor Movements are Fluid and Coordinated: Yes Fine Motor Movements are Fluid and Coordinated: Yes Motor  Motor Motor: Within Functional Limits Motor - Skilled Clinical  Observations: generalized weakness with pain during transitional movements Mobility    See Function Section of chart for details  Trunk/Postural Assessment  Cervical Assessment Cervical Assessment: Exceptions to WFL(forward head at rest) Thoracic Assessment Thoracic Assessment: Exceptions to WFL(thoracic rounding and kyphosis present) Lumbar Assessment Lumbar Assessment: Exceptions to WFL(posterior pelvic tilt in sitting)  Balance Balance Balance Assessed: Yes Static Sitting Balance Static Sitting - Balance Support: Feet supported Static Sitting - Level of Assistance: 5: Stand by assistance Dynamic Sitting Balance Dynamic Sitting - Balance Support: Feet supported;During functional activity Dynamic Sitting - Level of Assistance: 4: Min assist Static Standing  Balance Static Standing - Balance Support: During functional activity Static Standing - Level of Assistance: 1: +1 Total assist Dynamic Standing Balance Dynamic Standing - Balance Support: During functional activity Dynamic Standing - Level of Assistance: 1: +2 Total assist Extremity/Trunk Assessment RUE Assessment RUE Assessment: Exceptions to [facility] WFLS with strength 3+/5 throughout) LUE Assessment LUE Assessment: Exceptions to [facility] WFLs with strength 3+/5 throughout)   See Function Navigator for Current Functional Status.   Refer to Care Plan for Long Term Goals  Recommendations for other services: None    Discharge Criteria: Patient will be discharged from OT if patient refuses treatment 3 consecutive times without medical reason, if treatment goals not met, if there is a change in medical status, if patient makes no progress towards goals or if patient is discharged from hospital.  The above assessment, treatment plan, treatment alternatives and goals were discussed and mutually agreed upon: by patient  Cache Decoursey OTR/L 02/19/2017, 5:21 PM

## 2017-02-19 NOTE — Progress Notes (Signed)
Bilateral lower extremity venous duplex has been completed. Negative for DVT.  02/19/17 11:00 AM [name] [name] RVT

## 2017-02-19 NOTE — Care Management Note (Signed)
Inpatient Rehabilitation Center Individual Statement of Services  Patient Name:  [name] [name]  Date:  02/19/2017  Welcome to the Inpatient Rehabilitation Center.  Our goal is to provide you with an individualized program based on your diagnosis and situation, designed to meet your specific needs.  With this comprehensive rehabilitation program, you will be expected to participate in at least 3 hours of rehabilitation therapies Monday-Friday, with modified therapy programming on the weekends.  Your rehabilitation program will include the following services:  Physical Therapy (PT), Occupational Therapy (OT), 24 hour per day rehabilitation nursing, Case Management (Social Worker), Rehabilitation Medicine, Nutrition Services and Pharmacy Services  Weekly team conferences will be held on Wednesday to discuss your progress.  Your Social Worker will talk with you frequently to get your input and to update you on team discussions.  Team conferences with you and your family in attendance may also be held.  Expected length of stay: 17-19 days  Overall anticipated outcome: min assist level  Depending on your progress and recovery, your program may change. Your Social Worker will coordinate services and will keep you informed of any changes. Your Social Worker's name and contact numbers are listed  below.  The following services may also be recommended but are not provided by the Inpatient Rehabilitation Center:    Home Health Rehabiltiation Services  Outpatient Rehabilitation Services    Arrangements will be made to provide these services after discharge if needed.  Arrangements include referral to agencies that provide these services.  Your insurance has been verified to be:  Medicare & Medicaid Your primary doctor is:  Reynolds BowlLloyd Comstock  Pertinent information will be shared with your doctor and your insurance company.  Social Worker:  Dossie DerBecky [place], [place] [phone] or [phone]) [phone]  Information  discussed with and copy given to patient by: Lucy Chrisupree, Treina Arscott G, 02/19/2017, 10:52 AM

## 2017-02-19 NOTE — Evaluation (Signed)
Physical Therapy Assessment and Plan  Patient Details  Name: [name] [name] MRN: [id number] Date of Birth: April 11, 1917  PT Diagnosis: Difficulty walking, Muscle weakness and Pain in joint Rehab Potential: Fair ELOS: 2.5-3 weeks    Today's Date: 02/19/2017 PT Individual Time: 9622-2979 AND 1445-1600 PT Individual Time Calculation (min): 75 min  AND 75   Problem List:  Patient Active Problem List   Diagnosis Date Noted  . Femoral neck fracture ([place]) 02/18/2017  . H/O: CVA (cerebrovascular accident)   . Post-operative pain   . Acute blood loss anemia   . Tachycardia   . Steroid-induced hyperglycemia   . Thrombocytopenia ([place])   . Closed right hip fracture, initial encounter ([place]) 02/15/2017  . Atelectasis of left lung 02/15/2017  . Displaced fracture of right femoral neck ([place]) 02/15/2017  . Nausea and vomiting 03/09/2011  . Diabetes mellitus, type II ([place]) 03/09/2011  . Hyperkalemia 03/09/2011  . Acute renal failure ([place]) 03/09/2011  . Hypotension 03/09/2011  . Anemia 03/09/2011  . Coagulopathy ([place]) 03/09/2011  . HTN (hypertension) 02/19/2011  . Dizziness 02/19/2011  . Cerebellar infarct ([place]) 02/19/2011  . Bradycardia 02/19/2011  . H/O: CVA (cardiovascular accident) 02/19/2011  . SPONDYLOSIS 09/18/2009  . SCIATICA 09/18/2009  . SCOLIOSIS-IDIOPATHIC 09/18/2009  . SPONDYLOLITHESIS 09/18/2009    Past Medical History:  Past Medical History:  Diagnosis Date  . Arthritis   . Diabetes mellitus type II 02/19/2011  . Dizziness   . Gout   . Gout spondylitis   . 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 Hypertension   . Sciatica   . Stroke [facility])    Past Surgical History:  Past Surgical History:  Procedure Laterality Date  . ABDOMINAL HYSTERECTOMY    . HIP PINNING,CANNULATED Right 02/15/2017   Procedure: ANTERIOR APPROACH HEMI HIP ARTHROPLASTY;  Surgeon: Rod Can, MD;  Location: [place];  Service: Orthopedics;  Laterality: Right;     Assessment & Plan Clinical Impression: Patient is a 84 y.o.right handed femalewith history ofdiet-controlleddiabetes mellituson no oral diabetic medication,CKDstage III,CVA, hypertension. Per chart reviewand family,patient lives with herson, who can only provide supervision at discharge. One level home with ramped entrance. Used a rolling walker at times prior to admission. Presented 02/15/2017 after mechanical fall landing on her right hip. Denied any loss of consciousness dizziness or chest pain. X-rays and imaging revealed displaced right femoral neck fracture. Underwent right hip hemiarthroplasty anterior approach 02/15/2017 per Dr. Lyla Glassing. Hospital course pain management. Weightbearing as tolerated.NOactive abduction. Acute blood loss anemia 6.6transfuse with follow-up hemoglobin 10.5. Patient remains on Plavix as prior to hospital admissionas well as aspirinwhich wasincreased to 81 mg twice a day  Patient transferred to CIR on 02/18/2017 .   Patient currently requires total with mobility secondary to muscle weakness and muscle joint tightness, decreased attention, decreased awareness, decreased problem solving, decreased safety awareness, decreased memory and delayed processing and decreased standing balance and decreased postural control.  Prior to hospitalization, patient was modified independent  with mobility and lived with Son in a House home.  Home access is  Ramped entrance.  Patient will benefit from skilled PT intervention to maximize safe functional mobility, minimize fall risk and decrease caregiver burden for planned discharge home with 24 hour assist.  Anticipate patient will benefit from follow up [facility] at discharge.  PT - End of Session Activity Tolerance: Tolerates < 10 min activity, no significant change in vital signs Endurance Deficit: Yes PT Assessment Rehab Potential (ACUTE/IP ONLY): Fair PT Barriers to  Discharge: Decreased caregiver support;Home  environment access/layout PT Patient demonstrates impairments in the following area(s): Balance;Behavior;Edema;Endurance;Motor;Nutrition;Pain;Perception;Sensory;Safety;Skin Integrity PT Transfers Functional Problem(s): Bed Mobility;Bed to Chair;Car;Furniture;Floor PT Locomotion Functional Problem(s): Wheelchair Mobility;Stairs;Ambulation PT Plan PT Intensity: Minimum of 1-2 x/day ,45 to 90 minutes PT Frequency: 5 out of 7 days PT Duration Estimated Length of Stay: 2.5-3 weeks  PT Treatment/Interventions: Ambulation/gait training;Balance/vestibular training;Cognitive remediation/compensation;Community reintegration;Discharge planning;Disease management/prevention;DME/adaptive equipment instruction;Patient/family education;Functional mobility training;Functional electrical stimulation;Neuromuscular re-education;Pain management;Psychosocial support;Skin care/wound management;Splinting/orthotics;Therapeutic Exercise;Stair training;Therapeutic Activities;UE/LE Coordination activities;UE/LE Strength taining/ROM;Visual/perceptual remediation/compensation;Wheelchair propulsion/positioning PT Transfers Anticipated Outcome(s): min assist with LRAD  PT Locomotion Anticipated Outcome(s): Min assist ambulatory for short distances and supervision assit WC mobility  PT Recommendation Follow Up Recommendations: Home health PT Patient destination: Home Equipment Recommended: To be determined;Wheelchair (measurements);Wheelchair cushion (measurements)  Skilled Therapeutic Intervention Pt received sitting in WC and agreeable to PT. PT instructed patient in PT Evaluation and initiated treatment intervention; see below for results. PT educated patient in [place], rehab potential, rehab goals, and discharge recommendations. Pt returned to room and performed SB transfer transfer to bed with total assist. Sit>supine completed with total assist and pt left supine in bed with call bell in reach and all needs met.    Session  2.   Pt received sitting in WC and agreeable to PT. SB transfers to and from mat table, and to and from bed. Mod assist with moderate cues for technique and increased WB through BLE as well as anterior weight shifting.  PT provided pt with elevating leg rest on the R for pain management and swelling control.   WC mobility instructed by PT with min-supervision assist x 152f. Min cues for posture, ROM, and improved use of BUE.   Patient returned to room and left [facility] call bell in reach and all needs met.        PT Evaluation Precautions/Restrictions Precautions Precautions: Fall;Anterior Hip Precaution Comments: no active abduction  Restrictions Weight Bearing Restrictions: Yes RLE Weight Bearing: Weight bearing as tolerated General   Vital Signs Pain Pain Assessment Pain Assessment: No/denies pain Pain Score: 0-No pain Home Living/Prior Functioning Home Living Available Help at Discharge: Family;Available 24 hours/day Type of Home: House Home Access: Ramped entrance Home Layout: One level Bathroom Shower/Tub: TChiropodist Standard  Lives With: Son Prior Function Level of Independence: Requires assistive device for independence  Able to Take Stairs?: Yes Driving: No Vocation: Retired Comments: Pt used cane PTA for for all mobility    Cognition Overall Cognitive Status: History of cognitive impairments - at baseline Orientation Level: Oriented to person;Oriented to situation Memory: Impaired Awareness: Impaired Problem Solving: Impaired Safety/Judgment: Impaired Sensation Sensation Light Touch: (P) Impaired by gross assessment(BLE light touch impaired distal to knee) Coordination Gross Motor Movements are Fluid and Coordinated: (P) No Fine Motor Movements are Fluid and Coordinated: (P) Yes Motor  Motor Motor: Within Functional Limits Motor - Skilled Clinical Observations: generalized weakness with pain during transitional  movements  Mobility Bed Mobility Bed Mobility: Supine to Sit;Sit to Supine Rolling Right: 2: Max assist Supine to Sit: 1: +1 Total assist Sit to Supine: 1: +1 Total assist Transfers Transfers: Yes Sit to Stand: 1: +1 Total assist Squat Pivot Transfers: 1: +1 Total assist Lateral/Scoot Transfers: 3: Mod assist Locomotion  Ambulation Ambulation: No Gait Gait: No Stairs / Additional Locomotion Stairs: No Wheelchair Mobility Wheelchair Mobility: Yes Wheelchair Assistance: 4: Min aLexicographer Both upper extremities Wheelchair Parts Management: Supervision/cueing Distance: 125  Trunk/Postural Assessment  Cervical  Assessment Cervical Assessment: Exceptions to WFL(forward head at rest) Thoracic Assessment Thoracic Assessment: Exceptions to WFL(thoracic rounding and kyphosis present) Lumbar Assessment Lumbar Assessment: Exceptions to WFL(posterior pelvic tilt in sitting)  Balance Balance Balance Assessed: Yes Static Sitting Balance Static Sitting - Balance Support: Feet supported Static Sitting - Level of Assistance: 5: Stand by assistance Dynamic Sitting Balance Dynamic Sitting - Balance Support: Feet supported;During functional activity Dynamic Sitting - Level of Assistance: 4: Min assist Static Standing Balance Static Standing - Balance Support: During functional activity Static Standing - Level of Assistance: 1: +1 Total assist Dynamic Standing Balance Dynamic Standing - Balance Support: During functional activity Dynamic Standing - Level of Assistance: 1: +2 Total assist Extremity Assessment      RLE Assessment RLE Assessment: Exceptions to WFL(2/5 proximal to distal. limited knee flexion ) LLE Assessment LLE Assessment: Exceptions to WFL(pre-morbid hemiplegia. limited DF. hip flexion/extension 4/5 knee extension 4-/5. knee flexion 4/5)   See Function Navigator for Current Functional Status.   Refer to Care Plan for Long Term  Goals  Recommendations for other services: None   Discharge Criteria: Patient will be discharged from PT if patient refuses treatment 3 consecutive times without medical reason, if treatment goals not met, if there is a change in medical status, if patient makes no progress towards goals or if patient is discharged from hospital.  The above assessment, treatment plan, treatment alternatives and goals were discussed and mutually agreed upon: by patient  Lorie Phenix 02/19/2017, 11:07 AM

## 2017-02-19 NOTE — Plan of Care (Signed)
Pt voided continent No BM Pain controlled with current plan of care

## 2017-02-19 NOTE — Progress Notes (Signed)
Patient information reviewed and entered into eRehab system by Fiorela Pelzer, RN, CRRN, PPS Coordinator.  Information including medical coding and functional independence measure will be reviewed and updated through discharge.     Per nursing patient was given "Data Collection Information Summary for Patients in Inpatient Rehabilitation Facilities with attached "Privacy Act Statement-Health Care Records" upon admission.  

## 2017-02-19 NOTE — Progress Notes (Signed)
Subjective/Complaints: Confused up in chair at nsg desk  ROS- difficult to obtain poor attention  Objective: Vital Signs: Blood pressure (!) 165/82, pulse 99, temperature 98.4 F (36.9 C), temperature source Oral, resp. rate 16, height '4\' 10"'$  (1.473 m), weight 58 kg (127 lb 13.9 oz), SpO2 97 %. No results found. Results for orders placed or performed during the hospital encounter of 02/18/17 (from the past 72 hour(s))  Glucose, capillary     Status: Abnormal   Collection Time: 02/18/17  9:22 PM  Result Value Ref Range   Glucose-Capillary 145 (H) 65 - 99 mg/dL  CBC WITH DIFFERENTIAL     Status: Abnormal   Collection Time: 02/19/17  5:36 AM  Result Value Ref Range   WBC 9.5 4.0 - 10.5 K/uL   RBC 3.84 (L) 3.87 - 5.11 MIL/uL   Hemoglobin 10.7 (L) 12.0 - 15.0 g/dL   HCT 33.6 (L) 36.0 - 46.0 %   MCV 87.5 78.0 - 100.0 fL   MCH 27.9 26.0 - 34.0 pg   MCHC 31.8 30.0 - 36.0 g/dL   RDW 16.1 (H) 11.5 - 15.5 %   Platelets 145 (L) 150 - 400 K/uL   Neutrophils Relative % 69 %   Neutro Abs 6.6 1.7 - 7.7 K/uL   Lymphocytes Relative 20 %   Lymphs Abs 1.9 0.7 - 4.0 K/uL   Monocytes Relative 9 %   Monocytes Absolute 0.8 0.1 - 1.0 K/uL   Eosinophils Relative 2 %   Eosinophils Absolute 0.2 0.0 - 0.7 K/uL   Basophils Relative 0 %   Basophils Absolute 0.0 0.0 - 0.1 K/uL  Comprehensive metabolic panel     Status: Abnormal   Collection Time: 02/19/17  5:36 AM  Result Value Ref Range   Sodium 141 135 - 145 mmol/L   Potassium 4.5 3.5 - 5.1 mmol/L   Chloride 109 101 - 111 mmol/L   CO2 23 22 - 32 mmol/L   Glucose, Bld 136 (H) 65 - 99 mg/dL   BUN 31 (H) 6 - 20 mg/dL   Creatinine, Ser 1.31 (H) 0.44 - 1.00 mg/dL   Calcium 8.6 (L) 8.9 - 10.3 mg/dL   Total Protein 6.3 (L) 6.5 - 8.1 g/dL   Albumin 3.2 (L) 3.5 - 5.0 g/dL   AST 77 (H) 15 - 41 U/L   ALT 74 (H) 14 - 54 U/L   Alkaline Phosphatase 83 38 - 126 U/L   Total Bilirubin 1.3 (H) 0.3 - 1.2 mg/dL   GFR calc non Af Amer 33 (L) >60 mL/min   GFR  calc Af Amer 38 (L) >60 mL/min    Comment: (NOTE) The eGFR has been calculated using the CKD EPI equation. This calculation has not been validated in all clinical situations. eGFR's persistently <60 mL/min signify possible Chronic Kidney Disease.    Anion gap 9 5 - 15     HEENT: normal Cardio: RRR and no murmur Resp: CTA B/L and unlabored GI: BS positive and NT, ND Extremity:  Pulses positive and No Edema Skin:   Other RIght hip wound with post op dressing Neuro: Confused, Abnormal Sensory difficult to assess due to poor attention reports nl LT in feet, Abnormal Motor 5/5 in BUE, 4/5 LLE , 2- R HF 3- KE and ankle (pain) and Other oriented to person and reason for hospitalization not to time or place Musc/Skel:  Swelling RIght thigh and leg swelling and Other pain with passive R HF Gen NAD   Assessment/Plan: 1. Functional  deficits secondary to RIght hip subcapital fx which require 3+ hours per day of interdisciplinary therapy in a comprehensive inpatient rehab setting. Physiatrist is providing close team supervision and 24 hour management of active medical problems listed below. Physiatrist and rehab team continue to assess barriers to discharge/monitor patient progress toward functional and medical goals. FIM:                   Function - Comprehension Comprehension: Auditory Comprehension assist level: Follows basic conversation/direction with extra time/assistive device  Function - Expression Expression: Verbal Expression assist level: Expresses basic needs/ideas: With extra time/assistive device  Function - Social Interaction Social Interaction assist level: Interacts appropriately with others with medication or extra time (anti-anxiety, antidepressant).     Function - Memory Patient normally able to recall (first 3 days only): Current season, That he or she is in a hospital  Medical Problem List and Plan: 1.Decreased functional mobilitysecondary to  displaced right femoral neck fracture after fall. Status post right hip hemiarthroplasty anterior approach 02/15/2017. Weightbearing as tolerated no active abduction CIR PT, OT 2. DVT Prophylaxis/Anticoagulation: Aspirin 81 mg twice a day. Check vascular study-RLE swollen , no erythema 3. Pain Management:Hydrocodone as needed 4. Mood:Provide emotional support 5. Neuropsych: This patientiscapable of making decisions on herown behalf. 6. Skin/Wound Care:Routine skin checks 7. Fluids/Electrolytes/Nutrition:Routine I&O's with follow-up chemistries 8.Acute blood loss anemia. Follow-up CBC Hgb Stable 10.5 on 1/4 9.History of CVA. Continue Plavix aspirin prior to admission. 10.Diet controlled diabetes mellitus. Patient on no oral diabetic agents prior to admission.CBG (last 3)  Recent Labs    02/18/17 0619 02/18/17 1118 02/18/17 2122  GLUCAP 113* 171* 145*  Controlled 1/4  11.Hypertension. Demadex 10 mg daily. Monitor with increased mobility 12.CKDstage III, follow-up chemistries 13.History of dizziness. Continue Antivert as needed 14.History of gout. Zyloprim 300 mg at bedtime. Monitor for any gout flare ups 15.History of overactive bladder. Toviaz 4 mg daily. Check PVR 3 16.Hyperlipidemia. Lipitor 17.Constipation. Laxative assistance 18.  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

## 2017-02-20 LAB — GLUCOSE, CAPILLARY: Glucose-Capillary: 140 mg/dL — ABNORMAL HIGH (ref 65–99)

## 2017-02-20 MED ORDER — NITROFURANTOIN MONOHYD MACRO 100 MG PO CAPS
100.0000 mg | ORAL_CAPSULE | Freq: Two times a day (BID) | ORAL | Status: DC
  Administered 2017-02-20 – 2017-02-27 (×16): 100 mg via ORAL
  Filled 2017-02-20 (×17): qty 1

## 2017-02-20 NOTE — Progress Notes (Signed)
Subjective/Complaints: Slept well in bed last noc per RN  ROS- difficult to obtain poor attention  Objective: Vital Signs: Blood pressure (!) 142/66, pulse 100, temperature 98.6 F (37 C), temperature source Oral, resp. rate 18, height '4\' 10"'  (1.473 m), weight 58 kg (127 lb 13.9 oz), SpO2 100 %. No results found. Results for orders placed or performed during the hospital encounter of 02/18/17 (from the past 72 hour(s))  Glucose, capillary     Status: Abnormal   Collection Time: 02/18/17  9:22 PM  Result Value Ref Range   Glucose-Capillary 145 (H) 65 - 99 mg/dL  CBC WITH DIFFERENTIAL     Status: Abnormal   Collection Time: 02/19/17  5:36 AM  Result Value Ref Range   WBC 9.5 4.0 - 10.5 K/uL   RBC 3.84 (L) 3.87 - 5.11 MIL/uL   Hemoglobin 10.7 (L) 12.0 - 15.0 g/dL   HCT 33.6 (L) 36.0 - 46.0 %   MCV 87.5 78.0 - 100.0 fL   MCH 27.9 26.0 - 34.0 pg   MCHC 31.8 30.0 - 36.0 g/dL   RDW 16.1 (H) 11.5 - 15.5 %   Platelets 145 (L) 150 - 400 K/uL   Neutrophils Relative % 69 %   Neutro Abs 6.6 1.7 - 7.7 K/uL   Lymphocytes Relative 20 %   Lymphs Abs 1.9 0.7 - 4.0 K/uL   Monocytes Relative 9 %   Monocytes Absolute 0.8 0.1 - 1.0 K/uL   Eosinophils Relative 2 %   Eosinophils Absolute 0.2 0.0 - 0.7 K/uL   Basophils Relative 0 %   Basophils Absolute 0.0 0.0 - 0.1 K/uL  Comprehensive metabolic panel     Status: Abnormal   Collection Time: 02/19/17  5:36 AM  Result Value Ref Range   Sodium 141 135 - 145 mmol/L   Potassium 4.5 3.5 - 5.1 mmol/L   Chloride 109 101 - 111 mmol/L   CO2 23 22 - 32 mmol/L   Glucose, Bld 136 (H) 65 - 99 mg/dL   BUN 31 (H) 6 - 20 mg/dL   Creatinine, Ser 1.31 (H) 0.44 - 1.00 mg/dL   Calcium 8.6 (L) 8.9 - 10.3 mg/dL   Total Protein 6.3 (L) 6.5 - 8.1 g/dL   Albumin 3.2 (L) 3.5 - 5.0 g/dL   AST 77 (H) 15 - 41 U/L   ALT 74 (H) 14 - 54 U/L   Alkaline Phosphatase 83 38 - 126 U/L   Total Bilirubin 1.3 (H) 0.3 - 1.2 mg/dL   GFR calc non Af Amer 33 (L) >60 mL/min   GFR  calc Af Amer 38 (L) >60 mL/min    Comment: (NOTE) The eGFR has been calculated using the CKD EPI equation. This calculation has not been validated in all clinical situations. eGFR's persistently <60 mL/min signify possible Chronic Kidney Disease.    Anion gap 9 5 - 15  Urinalysis, Routine w reflex microscopic     Status: Abnormal   Collection Time: 02/19/17  6:41 PM  Result Value Ref Range   Color, Urine YELLOW YELLOW   APPearance TURBID (A) CLEAR   Specific Gravity, Urine 1.010 1.005 - 1.030   pH 6.0 5.0 - 8.0   Glucose, UA NEGATIVE NEGATIVE mg/dL   Hgb urine dipstick LARGE (A) NEGATIVE   Bilirubin Urine NEGATIVE NEGATIVE   Ketones, ur NEGATIVE NEGATIVE mg/dL   Protein, ur 30 (A) NEGATIVE mg/dL   Nitrite NEGATIVE NEGATIVE   Leukocytes, UA LARGE (A) NEGATIVE  Urinalysis, Microscopic (reflex)  Status: Abnormal   Collection Time: 02/19/17  6:41 PM  Result Value Ref Range   RBC / HPF TOO NUMEROUS TO COUNT 0 - 5 RBC/hpf   WBC, UA TOO NUMEROUS TO COUNT 0 - 5 WBC/hpf   Bacteria, UA MANY (A) NONE SEEN   Squamous Epithelial / LPF 0-5 (A) NONE SEEN  Glucose, capillary     Status: Abnormal   Collection Time: 02/19/17  9:02 PM  Result Value Ref Range   Glucose-Capillary 121 (H) 65 - 99 mg/dL     HEENT: normal Cardio: RRR and no murmur Resp: CTA B/L and unlabored GI: BS positive and NT, ND Extremity:  Pulses positive and No Edema Skin:   Other RIght hip wound with post op dressing Neuro: Confused, Abnormal Sensory difficult to assess due to poor attention reports nl LT in feet, Abnormal Motor 5/5 in BUE, 4/5 LLE , 2- R HF 3- KE and ankle (pain) and Other oriented to person and reason for hospitalization not to time or place Musc/Skel:  Swelling RIght thigh and leg swelling and Other pain with passive R HF Gen NAD   Assessment/Plan: 1. Functional deficits secondary to RIght hip subcapital fx which require 3+ hours per day of interdisciplinary therapy in a comprehensive inpatient  rehab setting. Physiatrist is providing close team supervision and 24 hour management of active medical problems listed below. Physiatrist and rehab team continue to assess barriers to discharge/monitor patient progress toward functional and medical goals. FIM: Function - Bathing Position: Sitting EOB Body parts bathed by patient: Right arm, Left arm, Chest, Abdomen, Right upper leg, Left upper leg Body parts bathed by helper: Front perineal area, Buttocks, Left lower leg, Right lower leg, Back Assist Level: 2 helpers  Function- Upper Body Dressing/Undressing What is the patient wearing?: Pull over shirt/dress Pull over shirt/dress - Perfomed by patient: Put head through opening Pull over shirt/dress - Perfomed by helper: Thread/unthread right sleeve, Thread/unthread left sleeve, Pull shirt over trunk Function - Lower Body Dressing/Undressing What is the patient wearing?: Pants Position: Sitting EOB Pants- Performed by helper: Thread/unthread right pants leg, Thread/unthread left pants leg, Pull pants up/down Assist for lower body dressing: 2 Helpers        Function - Chair/bed transfer Chair/bed transfer method: Squat pivot Chair/bed transfer assist level: 2 helpers Chair/bed transfer assistive device: Armrests  Function - Locomotion: Wheelchair Type: Manual Max wheelchair distance: 110f Assist Level: Touching or steadying assistance (Pt > 75%) Assist Level: Supervision or verbal cues Wheel 150 feet activity did not occur: Safety/medical concerns Turns around,maneuvers to table,bed, and toilet,negotiates 3% grade,maneuvers on rugs and over doorsills: No Function - Locomotion: Ambulation Ambulation activity did not occur: Safety/medical concerns Walk 10 feet activity did not occur: Safety/medical concerns Walk 50 feet with 2 turns activity did not occur: Safety/medical concerns Walk 150 feet activity did not occur: Safety/medical concerns Walk 10 feet on uneven surfaces  activity did not occur: Safety/medical concerns  Function - Comprehension Comprehension: Auditory Comprehension assist level: Understands basic 90% of the time/cues < 10% of the time  Function - Expression Expression: Verbal Expression assist level: Expresses basic 50 - 74% of the time/requires cueing 25 - 49% of the time. Needs to repeat parts of sentences.  Function - Social Interaction Social Interaction assist level: Interacts appropriately 75 - 89% of the time - Needs redirection for appropriate language or to initiate interaction.  Function - Problem Solving Problem solving assist level: Solves basic 25 - 49% of the time - needs  direction more than half the time to initiate, plan or complete simple activities  Function - Memory Memory assist level: Recognizes or recalls 25 - 49% of the time/requires cueing 50 - 75% of the time Patient normally able to recall (first 3 days only): Current season  Medical Problem List and Plan: 1.Decreased functional mobilitysecondary to displaced right femoral neck fracture after fall. Status post right hip hemiarthroplasty anterior approach 02/15/2017. Weightbearing as tolerated no active abduction CIR PT, OT 2. DVT Prophylaxis/Anticoagulation: Aspirin 81 mg twice a day. Check vascular study-RLE swollen , no erythema 3. Pain Management:Hydrocodone as needed 4. Mood:Provide emotional support 5. Neuropsych: This patientiscapable of making decisions on herown behalf. 6. Skin/Wound Care:Routine skin checks 7. Fluids/Electrolytes/Nutrition:Routine I&O's with follow-up chemistries, enc po fluids, po 25-75% meals 8.Acute blood loss anemia. Follow-up CBC Hgb Stable 10.5 on 1/4 9.History of CVA. Continue Plavix aspirin prior to admission. 10.Diet controlled diabetes mellitus. Patient on no oral diabetic agents prior to admission.CBG (last 3)  Recent Labs    02/18/17 1118 02/18/17 2122 02/19/17 2102  GLUCAP 171* 145* 121*  Controlled  1/4  11.Hypertension. Demadex 10 mg daily. Monitor with increased mobility 12.CKDstage III, follow-up chemistries, GFR at baseline , BUN mildly elevated 13.History of dizziness. Continue Antivert as needed 14.History of gout. Zyloprim 300 mg at bedtime. Monitor for any gout flare ups 15.History of overactive bladder. Toviaz 4 mg daily. Check PVR 3 16.Hyperlipidemia. Lipitor 17.Constipation. Laxative assistance 18.  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

## 2017-02-20 NOTE — Plan of Care (Signed)
  RH BLADDER ELIMINATION RH STG MANAGE BLADDER WITH ASSISTANCE Description STG Manage Bladder With Min Assistance  Incontinent at times 02/20/2017 1225 - Not Progressing by Pierre BaliLeonar, Sheron Tallman G, RN

## 2017-02-20 NOTE — IPOC Note (Signed)
Overall Plan of Care [facility](IPOC) Patient Details Name: [name] [name] S Twombly MRN: [id number] DOB: 1917-06-01  Admitting Diagnosis: <principal problem not specified>  Hospital Problems: Active Problems:   Femoral neck fracture (HCC)     Functional Problem List: Nursing Bladder, Bowel, Edema, Endurance, Medication Management, Motor, Pain, Skin Integrity  PT Balance, Behavior, Edema, Endurance, Motor, Nutrition, Pain, Perception, Sensory, Safety, Skin Integrity  OT Balance, Pain, Edema, Cognition, Safety  SLP    TR         Basic ADL's: OT Grooming, Bathing, Dressing, Toileting     Advanced  ADL's: OT       Transfers: PT Bed Mobility, Bed to Chair, Car, Furniture, Floor  OT Toilet, Research scientist (life sciences)Tub/Shower     Locomotion: PT Psychologist, prison and probation servicesWheelchair Mobility, Stairs, Ambulation     Additional Impairments: OT    SLP        TR      Anticipated Outcomes Item Anticipated Outcome  Self Feeding independent  Swallowing      Basic self-care  min assist  Toileting  min assist   Bathroom Transfers min assist  Bowel/Bladder  Pt will manage bowel and bladder with min assist at discharge   Transfers  min assist with LRAD   Locomotion  Min assist ambulatory for short distances and supervision assit WC mobility   Communication     Cognition     Pain  Pt will manage pain at 3 or less on a scale of 0-10 while in rehab.   Safety/Judgment  Pt will remain free of falls and injury with min assist    Therapy Plan: PT Intensity: Minimum of 1-2 x/day ,45 to 90 minutes PT Frequency: 5 out of 7 days PT Duration Estimated Length of Stay: 2.5-3 weeks  OT Intensity: Minimum of 1-2 x/day, 45 to 90 minutes OT Frequency: 5 out of 7 days OT Duration/Estimated Length of Stay: 17-19 days      Team Interventions: Nursing Interventions Patient/Family Education, Bladder Management, Disease Management/Prevention, Pain Management, Medication Management, Skin Care/Wound Management, Discharge Planning  PT interventions  Ambulation/gait training, Balance/vestibular training, Cognitive remediation/compensation, Community reintegration, Discharge planning, Disease management/prevention, DME/adaptive equipment instruction, Patient/family education, Functional mobility training, Functional electrical stimulation, Neuromuscular re-education, Pain management, Psychosocial support, Skin care/wound management, Splinting/orthotics, Therapeutic Exercise, Stair training, Therapeutic Activities, UE/LE Coordination activities, UE/LE Strength taining/ROM, Visual/perceptual remediation/compensation, Wheelchair propulsion/positioning  OT Interventions Warden/rangerBalance/vestibular training, Cognitive remediation/compensation, FirefighterCommunity reintegration, Fish farm managerDME/adaptive equipment instruction, Discharge planning, Pain management, Self Care/advanced ADL retraining, Therapeutic Activities, UE/LE Coordination activities, Patient/family education, Therapeutic Exercise, Functional mobility training, UE/LE Strength taining/ROM  SLP Interventions    TR Interventions    SW/CM Interventions Discharge Planning, Psychosocial Support, Patient/Family Education   Barriers to Discharge MD  Medical stability, Incontinence and Behavior  Nursing      PT Decreased caregiver support, Home environment access/layout    OT Decreased caregiver support Pt's son cannot physically assist per chart, will follow-up.  SLP      SW Decreased caregiver support Melvyn NethLewis can not provide physical care due to own health issues   Team Discharge Planning: Destination: PT-Home ,OT- Home , SLP-  Projected Follow-up: PT-Home health PT, OT-  24 hour supervision/assistance, SLP-  Projected Equipment Needs: PT-To be determined, Wheelchair (measurements), Wheelchair cushion (measurements), OT- 3 in 1 bedside comode, SLP-  Equipment Details: PT- , OT-  Patient/family involved in discharge planning: PT- Patient,  OT-Patient, SLP-   MD ELOS: 14-17d Medical Rehab Prognosis:  Good Assessment:   84 y.o.right handed femalewith history ofdiet-controlleddiabetes mellituson no oral  diabetic medication,CKDstage III,CVA, hypertension. Per chart reviewand family,patient lives with herson, who can only provide supervision at discharge. One level home with ramped entrance. Used a rolling walker at times prior to admission. Presented 02/15/2017 after mechanical fall landing on her right hip. Denied any loss of consciousness dizziness or chest pain. X-rays and imaging revealed displaced right femoral neck fracture. Underwent right hip hemiarthroplasty anterior approach 02/15/2017 per Dr. Linna Caprice. Hospital course pain management. Weightbearing as tolerated.NOactive abduction. Acute blood loss anemia 6.6transfuse with follow-up hemoglobin 10.5. Patient remains on Plavix as prior to hospital admissionas well as aspirinwhich wasincreased to 81 mg twice a day   Now requiring 24/7 Rehab RN,MD, as well as CIR level PT, OT and SLP.  Treatment team will focus on ADLs and mobility with goals set at [facility] See Team Conference Notes for weekly updates to the plan of care

## 2017-02-21 ENCOUNTER — Inpatient Hospital Stay (HOSPITAL_COMMUNITY): Payer: Medicare Other | Admitting: Physical Therapy

## 2017-02-21 ENCOUNTER — Inpatient Hospital Stay (HOSPITAL_COMMUNITY): Payer: Medicare Other

## 2017-02-21 DIAGNOSIS — M1711 Unilateral primary osteoarthritis, right knee: Secondary | ICD-10-CM

## 2017-02-21 MED ORDER — DICLOFENAC SODIUM 1 % TD GEL
2.0000 g | Freq: Four times a day (QID) | TRANSDERMAL | Status: DC
  Administered 2017-02-21 – 2017-03-02 (×32): 2 g via TOPICAL
  Filled 2017-02-21: qty 100

## 2017-02-21 NOTE — Progress Notes (Signed)
Occupational Therapy Session Note  Patient Details  Name: [name] [name] MRN: [id number] Date of Birth: [birth year]  Today's Date: 02/21/2017 OT Individual Time: 1500-1600 OT Individual Time Calculation (min): 60 min    Short Term Goals: Week 1:  OT Short Term Goal 1 (Week 1): Pt will complete LB bathing with sit to stand with max assist.   OT Short Term Goal 2 (Week 1): Pt will complete LB dressing with max assist sit to stand and AE PRN. OT Short Term Goal 3 (Week 1): Pt will complete UB dressing with supervision sit to stand.  OT Short Term Goal 4 (Week 1): Pt will complete stand pivot transfer with max assist using the RW for support.    Skilled Therapeutic Interventions/Progress Updates:    1:1. Pt in w/c finishing lunch with family in room. Pt able to introduce OT to family members with increased time. Pt reporting pain in R hip, however pt declines medication. Pt transfers throughout session w/c<>EOM and w/c>EOB with sliding board and MOD A to L with +2 just steadying w/c. Pt continues to requires cueing for head hips relationship and hand placement throughout transfer. Seated on EOM, pt completes various dynamic sitting balance/reaching activities such as using clothes pins on high target and ball toss 30 passes at a time to improve endurance and trunk flexion required for transfers.  Pt declines standing with walker or in standing frame this date d/t pain in hip. Pt returns to bed as stated above supine>MAX A +2 facilitating BLE d/t precautions. Exited session with pt seated in bed, call light in reach and family in room  Therapy Documentation Precautions:  Precautions Precautions: Fall, Anterior Hip Precaution Comments: no active abduction  Restrictions Weight Bearing Restrictions: Yes RLE Weight Bearing: Weight bearing as tolerated  See Function Navigator for Current Functional Status.   Therapy/Group: Individual Therapy  Shon HaleStephanie M Lajune Perine 02/21/2017, 4:19 PM

## 2017-02-21 NOTE — Progress Notes (Signed)
Orthopedic Tech Progress Note Patient Details:  [name] [name] August 30, 1917 [id number]  Ortho Devices Type of Ortho Device: Prafo boot/shoe Ortho Device/Splint Location: lle Ortho Device/Splint Interventions: Application   Post Interventions Patient Tolerated: Well Instructions Provided: Care of device   Nikki DomCrawford, Indianna Boran 02/21/2017, 11:39 AM

## 2017-02-21 NOTE — Progress Notes (Signed)
Physical Therapy Session Note  Patient Details  Name: [name] [name] MRN: [id number] Date of Birth: 06-10-1917  Today's Date: 02/21/2017 PT Individual Time: 0820-0935 PT Individual Time Calculation (min): 75 min   Short Term Goals: Week 1:  PT Short Term Goal 1 (Week 1): pt will perform bed mobility with mod assis t PT Short Term Goal 2 (Week 1): Pt will initiate gait training  PT Short Term Goal 3 (Week 1): Pt will perform sit<>stand transfer with Mod assist  PT Short Term Goal 4 (Week 1): Pt will perform bed<>WC transfers with mod assist consistently  PT Short Term Goal 5 (Week 1): Pt will propell WC [id number] with supervision assist   Skilled Therapeutic Interventions/Progress Updates:  Pt was seen bedside in the am finishing her breakfast. Pt rolled R/L with siderails and mod A with increased time. Pt dependent with hygiene and donning brief. Pt rolled multiple times to assist with puling up pants. Pt transferred supine to edge of bed with max A and siderail with increased time. Pt transferred edge of bed to w/c with sliding board and mod A with increased time and verbal cues. Pt propelled w/c about 20 feet with B UEs and min A with verbal cues. Pt performed AAROM R LE hip flex and LAQs,  AAROM to AROM L LE hip flex and LAQs, 2 sets x 10 reps each. Pt returned to room and left sitting up in w/c with quick release belt in place and call bell within reach.   Therapy Documentation Precautions:  Precautions Precautions: Fall, Anterior Hip Precaution Comments: no active abduction  Restrictions Weight Bearing Restrictions: Yes RLE Weight Bearing: Weight bearing as tolerated General:   Pain: Pt c/o soreness R shoulder and hip, no c/o pain as per pt.   See Function Navigator for Current Functional Status.   Therapy/Group: Individual Therapy  Rayford HalstedMitchell, [name] [name] 02/21/2017, 10:43 AM

## 2017-02-21 NOTE — Progress Notes (Signed)
Subjective/Complaints: RIght knee pain with movement, hip also sore  ROS- no CP, SOB, N/V/D, + freq of urine  Objective: Vital Signs: Blood pressure (!) 138/55, pulse 90, temperature 98.2 F (36.8 C), temperature source Oral, resp. rate 16, height '4\' 10"'  (1.473 m), weight 58 kg (127 lb 13.9 oz), SpO2 98 %. No results found. Results for orders placed or performed during the hospital encounter of 02/18/17 (from the past 72 hour(s))  Glucose, capillary     Status: Abnormal   Collection Time: 02/18/17  9:22 PM  Result Value Ref Range   Glucose-Capillary 145 (H) 65 - 99 mg/dL  CBC WITH DIFFERENTIAL     Status: Abnormal   Collection Time: 02/19/17  5:36 AM  Result Value Ref Range   WBC 9.5 4.0 - 10.5 K/uL   RBC 3.84 (L) 3.87 - 5.11 MIL/uL   Hemoglobin 10.7 (L) 12.0 - 15.0 g/dL   HCT 33.6 (L) 36.0 - 46.0 %   MCV 87.5 78.0 - 100.0 fL   MCH 27.9 26.0 - 34.0 pg   MCHC 31.8 30.0 - 36.0 g/dL   RDW 16.1 (H) 11.5 - 15.5 %   Platelets 145 (L) 150 - 400 K/uL   Neutrophils Relative % 69 %   Neutro Abs 6.6 1.7 - 7.7 K/uL   Lymphocytes Relative 20 %   Lymphs Abs 1.9 0.7 - 4.0 K/uL   Monocytes Relative 9 %   Monocytes Absolute 0.8 0.1 - 1.0 K/uL   Eosinophils Relative 2 %   Eosinophils Absolute 0.2 0.0 - 0.7 K/uL   Basophils Relative 0 %   Basophils Absolute 0.0 0.0 - 0.1 K/uL  Comprehensive metabolic panel     Status: Abnormal   Collection Time: 02/19/17  5:36 AM  Result Value Ref Range   Sodium 141 135 - 145 mmol/L   Potassium 4.5 3.5 - 5.1 mmol/L   Chloride 109 101 - 111 mmol/L   CO2 23 22 - 32 mmol/L   Glucose, Bld 136 (H) 65 - 99 mg/dL   BUN 31 (H) 6 - 20 mg/dL   Creatinine, Ser 1.31 (H) 0.44 - 1.00 mg/dL   Calcium 8.6 (L) 8.9 - 10.3 mg/dL   Total Protein 6.3 (L) 6.5 - 8.1 g/dL   Albumin 3.2 (L) 3.5 - 5.0 g/dL   AST 77 (H) 15 - 41 U/L   ALT 74 (H) 14 - 54 U/L   Alkaline Phosphatase 83 38 - 126 U/L   Total Bilirubin 1.3 (H) 0.3 - 1.2 mg/dL   GFR calc non Af Amer 33 (L) >60  mL/min   GFR calc Af Amer 38 (L) >60 mL/min    Comment: (NOTE) The eGFR has been calculated using the CKD EPI equation. This calculation has not been validated in all clinical situations. eGFR's persistently <60 mL/min signify possible Chronic Kidney Disease.    Anion gap 9 5 - 15  Urinalysis, Routine w reflex microscopic     Status: Abnormal   Collection Time: 02/19/17  6:41 PM  Result Value Ref Range   Color, Urine YELLOW YELLOW   APPearance TURBID (A) CLEAR   Specific Gravity, Urine 1.010 1.005 - 1.030   pH 6.0 5.0 - 8.0   Glucose, UA NEGATIVE NEGATIVE mg/dL   Hgb urine dipstick LARGE (A) NEGATIVE   Bilirubin Urine NEGATIVE NEGATIVE   Ketones, ur NEGATIVE NEGATIVE mg/dL   Protein, ur 30 (A) NEGATIVE mg/dL   Nitrite NEGATIVE NEGATIVE   Leukocytes, UA LARGE (A) NEGATIVE  Urinalysis, Microscopic (  reflex)     Status: Abnormal   Collection Time: 02/19/17  6:41 PM  Result Value Ref Range   RBC / HPF TOO NUMEROUS TO COUNT 0 - 5 RBC/hpf   WBC, UA TOO NUMEROUS TO COUNT 0 - 5 WBC/hpf   Bacteria, UA MANY (A) NONE SEEN   Squamous Epithelial / LPF 0-5 (A) NONE SEEN  Glucose, capillary     Status: Abnormal   Collection Time: 02/19/17  9:02 PM  Result Value Ref Range   Glucose-Capillary 121 (H) 65 - 99 mg/dL  Glucose, capillary     Status: Abnormal   Collection Time: 02/20/17  6:56 AM  Result Value Ref Range   Glucose-Capillary 140 (H) 65 - 99 mg/dL     HEENT: normal Cardio: RRR and no murmur Resp: CTA B/L and unlabored GI: BS positive and NT, ND Extremity:  Pulses positive and No Edema Skin:   Other RIght hip wound with post op dressing Neuro: Confused, Abnormal Sensory difficult to assess due to poor attention reports nl LT in feet, Abnormal Motor 5/5 in BUE, 4/5 LLE , 2- R HF 3- KE and ankle (pain) and Other oriented to person and reason for hospitalization not to time or place Musc/Skel:  Swelling RIght thigh and leg swelling and Other pain with passive R HF Gen  NAD   Assessment/Plan: 1. Functional deficits secondary to RIght hip subcapital fx which require 3+ hours per day of interdisciplinary therapy in a comprehensive inpatient rehab setting. Physiatrist is providing close team supervision and 24 hour management of active medical problems listed below. Physiatrist and rehab team continue to assess barriers to discharge/monitor patient progress toward functional and medical goals. FIM: Function - Bathing Position: Sitting EOB Body parts bathed by patient: Right arm, Left arm, Chest, Abdomen, Right upper leg, Left upper leg Body parts bathed by helper: Front perineal area, Buttocks, Left lower leg, Right lower leg, Back Assist Level: 2 helpers  Function- Upper Body Dressing/Undressing What is the patient wearing?: Pull over shirt/dress Pull over shirt/dress - Perfomed by patient: Put head through opening Pull over shirt/dress - Perfomed by helper: Thread/unthread right sleeve, Thread/unthread left sleeve, Pull shirt over trunk Function - Lower Body Dressing/Undressing What is the patient wearing?: Pants Position: Sitting EOB Pants- Performed by helper: Thread/unthread right pants leg, Thread/unthread left pants leg, Pull pants up/down Assist for lower body dressing: 2 Helpers        Function - Chair/bed transfer Chair/bed transfer activity did not occur: N/A Chair/bed transfer method: Squat pivot Chair/bed transfer assist level: 2 helpers Chair/bed transfer assistive device: Armrests  Function - Locomotion: Wheelchair Type: Manual Max wheelchair distance: 147f Assist Level: Touching or steadying assistance (Pt > 75%) Assist Level: Supervision or verbal cues Wheel 150 feet activity did not occur: Safety/medical concerns Turns around,maneuvers to table,bed, and toilet,negotiates 3% grade,maneuvers on rugs and over doorsills: No Function - Locomotion: Ambulation Ambulation activity did not occur: Safety/medical concerns Walk 10 feet  activity did not occur: Safety/medical concerns Walk 50 feet with 2 turns activity did not occur: Safety/medical concerns Walk 150 feet activity did not occur: Safety/medical concerns Walk 10 feet on uneven surfaces activity did not occur: Safety/medical concerns  Function - Comprehension Comprehension: Auditory Comprehension assist level: Understands basic 90% of the time/cues < 10% of the time  Function - Expression Expression: Verbal Expression assist level: Expresses basic 50 - 74% of the time/requires cueing 25 - 49% of the time. Needs to repeat parts of sentences.  Function -  Social Interaction Social Interaction assist level: Interacts appropriately 75 - 89% of the time - Needs redirection for appropriate language or to initiate interaction.  Function - Problem Solving Problem solving assist level: Solves basic 25 - 49% of the time - needs direction more than half the time to initiate, plan or complete simple activities  Function - Memory Memory assist level: Recognizes or recalls 25 - 49% of the time/requires cueing 50 - 75% of the time Patient normally able to recall (first 3 days only): Current season  Medical Problem List and Plan: 1.Decreased functional mobilitysecondary to displaced right femoral neck fracture after fall. Status post right hip hemiarthroplasty anterior approach 02/15/2017. Weightbearing as tolerated no active abduction CIR PT, OT Has R Knee pain with ROM- Xray no fx + tricompartmental OA will start voltaren gel 2. DVT Prophylaxis/Anticoagulation: Aspirin 81 mg twice a day. Check vascular study-Doppler Neg    3. Pain Management:Hydrocodone as needed 4. Mood:Provide emotional support 5. Neuropsych: This patientiscapable of making decisions on herown behalf. 6. Skin/Wound Care:Routine skin checks 7. Fluids/Electrolytes/Nutrition:Routine I&O's with follow-up chemistries, enc po fluids, po 25-75% meals 8.Acute blood loss anemia. Follow-up CBC Hgb  Stable 10.5 on 1/4 9.History of CVA. Continue Plavix aspirin prior to admission. 10.Diet controlled diabetes mellitus. Patient on no oral diabetic agents prior to admission.CBG (last 3)  Recent Labs    02/18/17 2122 02/19/17 2102 02/20/17 0656  GLUCAP 145* 121* 140*  Controlled 1/6  11.Hypertension. Demadex 10 mg daily. Monitor with increased mobility 12.CKDstage III, follow-up chemistries, GFR at baseline , BUN mildly elevated 13.History of dizziness. Continue Antivert as needed 14.History of gout. Zyloprim 300 mg at bedtime. Monitor for any gout flare ups 15.History of overactive bladder. Toviaz 4 mg daily. Check PVR 3 16.Hyperlipidemia. Lipitor 17.Constipation. Laxative assistance 18.  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 + UA + Cx on Macrodantin pending cultures LOS (Days) 3 A FACE TO FACE EVALUATION WAS PERFORMED  Charlett Blake 02/21/2017, 7:06 AM

## 2017-02-21 NOTE — Progress Notes (Signed)
Occupational Therapy Session Note  Patient Details  Name: [name] [name] MRN: [id number] Date of Birth: Aug 08, 1917  Today's Date: 02/21/2017 OT Individual Time: 1100-1156 OT Individual Time Calculation (min): 56 min    Short Term Goals: Week 1:  OT Short Term Goal 1 (Week 1): Pt will complete LB bathing with sit to stand with max assist.   OT Short Term Goal 2 (Week 1): Pt will complete LB dressing with max assist sit to stand and AE PRN. OT Short Term Goal 3 (Week 1): Pt will complete UB dressing with supervision sit to stand.  OT Short Term Goal 4 (Week 1): Pt will complete stand pivot transfer with max assist using the RW for support.    Skilled Therapeutic Interventions/Progress Updates:    1:1. Pt with no c/o pain at rest, however with mobility reporting pain in R hip. Pt requests to brush teeth at beginning of session requiring increased time and manual facilitation of anterior trunk flexion to spit in sink. Pt slide board transfer w/c<>EOB with MAX A and +2 steadying the board with Vc/manual facilitaion of anterior weight shifting, head hips relationship, hand placement and WB into BLE. While seated EOM pt reaches B to obtain playing cards and match on vertical surface to facilitate greater weight shifting outside BOS/trunk flexion in prep for transfers. Exited session with pt seated in w/c and set up for lunch.  Therapy Documentation Precautions:  Precautions Precautions: Fall, Anterior Hip Precaution Comments: no active abduction  Restrictions Weight Bearing Restrictions: Yes RLE Weight Bearing: Weight bearing as tolerated  See Function Navigator for Current Functional Status.   Therapy/Group: Individual Therapy  Shon HaleStephanie M Nereyda Bowler 02/21/2017, 12:12 PM

## 2017-02-22 ENCOUNTER — Inpatient Hospital Stay (HOSPITAL_COMMUNITY): Payer: Medicare Other | Admitting: Occupational Therapy

## 2017-02-22 ENCOUNTER — Inpatient Hospital Stay (HOSPITAL_COMMUNITY): Payer: Medicare Other

## 2017-02-22 DIAGNOSIS — B9620 Unspecified Escherichia coli [E. coli] as the cause of diseases classified elsewhere: Secondary | ICD-10-CM

## 2017-02-22 DIAGNOSIS — N390 Urinary tract infection, site not specified: Secondary | ICD-10-CM

## 2017-02-22 LAB — URINE CULTURE

## 2017-02-22 NOTE — Progress Notes (Signed)
Occupational Therapy Session Note  Patient Details  Name: [name] MRN: [id number] Date of Birth: 1917/11/25  Today's Date: 02/22/2017 OT Individual Time: 1515-1540 OT Individual Time Calculation (min): 25 min    Short Term Goals: Week 1:  OT Short Term Goal 1 (Week 1): Pt will complete LB bathing with sit to stand with max assist.   OT Short Term Goal 2 (Week 1): Pt will complete LB dressing with max assist sit to stand and AE PRN. OT Short Term Goal 3 (Week 1): Pt will complete UB dressing with supervision sit to stand.  OT Short Term Goal 4 (Week 1): Pt will complete stand pivot transfer with max assist using the RW for support.    Skilled Therapeutic Interventions/Progress Updates:    Pt worked on self feeding to start session with supervision.   Therapist noting that pt was having some trouble with her chopped up roast beef and she had actually had to spit some out as it was "too tough" for her to eat it.  Made nursing aware that pt had been working on eating her lunch for over 1 hour when this therapist came in for session.  Progressed to practicing sliding board transfers to the smaller drop arm commode during session.  She needed max assist to complete transfer on and off of the toilet, including positioning of the board.  Once this was completed, pt was positioned back in the wheelchair with call button and phone in reach.  She then continued working on self feeding.  Talked with pt's son [name] earlier in the session and discussed goals, progress, [facility]  He voiced understanding and wants to be involved in decision making process along with other family members.    Therapy Documentation Precautions:  Precautions Precautions: Fall, Anterior Hip Precaution Comments: no active abduction  Restrictions Weight Bearing Restrictions: No RLE Weight Bearing: Weight bearing as tolerated  Pain: Pain Assessment Pain Assessment:  Faces Pain Score: 8  Faces Pain Scale: Hurts a little bit Pain Type: Surgical pain Pain Location: Leg Pain Orientation: Right Pain Descriptors / Indicators: Discomfort Pain Onset: With Activity Pain Intervention(s): Repositioned ADL: See Function Navigator for Current Functional Status.   Therapy/Group: Individual Therapy  Louan Base OTR/L 02/22/2017, 4:23 PM

## 2017-02-22 NOTE — Progress Notes (Addendum)
Occupational Therapy Session Note  Patient Details  Name: [name] [name] MRN: [id number] Date of Birth: 11/02/1917  Today's Date: 02/22/2017 OT Individual Time: 1102-1200 OT Individual Time Calculation (min): 58 min    Short Term Goals: Week 1:  OT Short Term Goal 1 (Week 1): Pt will complete LB bathing with sit to stand with max assist.   OT Short Term Goal 2 (Week 1): Pt will complete LB dressing with max assist sit to stand and AE PRN. OT Short Term Goal 3 (Week 1): Pt will complete UB dressing with supervision sit to stand.  OT Short Term Goal 4 (Week 1): Pt will complete stand pivot transfer with max assist using the RW for support.    Skilled Therapeutic Interventions/Progress Updates:    Pt received sitting up in w/c agreeable to OT tx session. Transported Pt total assist via w/c to therapy gym. Pt completed slide board transfer w/c>mat table towards R side initially with ModA, required ModA+2 to fully advance hips onto table. Seated on mat table Pt participates in dynamic activity facilitating forward and lateral leans, trunk rotation, and increased UE strengthening. Pt completes card matching activity with light minA provided to facilitate forward and lateral leans. Pt with increased difficulty leaning towards R due to increased pain in R hip. Utilized Ship brokermirror for visual feed back to facilitate upright sitting posture at midline with max multimodal cues to reach and maintain midline as Pt favoring leaning towards L hip. Pt engaged in ball toss rotating to L, R, and forward to engage in activity, gradually increasing distance between tosses for increased challenge. Pt completes slide board transfer mat table>w/c with ModA+2, returned to room in manner describe above where pt was left seated in w/c, QRB donned, call bell and needs within reach.   Therapy Documentation Precautions:  Precautions Precautions: Fall, Anterior Hip Precaution Comments: no active abduction  Restrictions Weight  Bearing Restrictions: Yes RLE Weight Bearing: Weight bearing as tolerated   Pain: Pain Assessment Pain Assessment: 0-10 Pain Score: 8  Pain Type: Acute pain Pain Location: Hip Pain Orientation: Right Pain Descriptors / Indicators: Aching Pain Onset: With Activity Pain Intervention(s): Repositioned;Rest    See Function Navigator for Current Functional Status.   Therapy/Group: Individual Therapy  Orlando PennerBreanna L Lezli Danek 02/22/2017, 2:31 PM

## 2017-02-22 NOTE — Progress Notes (Signed)
Occupational Therapy Session Note  Patient Details  Name: [name] [name] MRN: [id number] Date of Birth: Feb 21, 1917  Today's Date: 02/22/2017 OT Individual Time: [id number] OT Individual Time Calculation (min): 52 min  Short Term Goals: Week 1:  OT Short Term Goal 1 (Week 1): Pt will complete LB bathing with sit to stand with max assist.   OT Short Term Goal 2 (Week 1): Pt will complete LB dressing with max assist sit to stand and AE PRN. OT Short Term Goal 3 (Week 1): Pt will complete UB dressing with supervision sit to stand.  OT Short Term Goal 4 (Week 1): Pt will complete stand pivot transfer with max assist using the RW for support.    Skilled Therapeutic Interventions/Progress Updates:    Pt greeted seated in w/c, agreeable to work on UB strengthening this session. Pt completed B UE stretches and AAROM exercises with max demonstrational and HOH cuing for proper technique. Manual cuing to minimize Rt shoulder hike during shoulder flexion and abduction. Afterwards she completed gentle UE exercises with use of 1# bar. Pt fatiguing quickly and required multiple rest breaks throughout session. At end of tx pt was set up for lunch. Safety belt applied and all needs within reach.   Therapy Documentation Precautions:  Precautions Precautions: Fall, Anterior Hip Precaution Comments: no active abduction  Restrictions Weight Bearing Restrictions: Yes RLE Weight Bearing: Weight bearing as tolerated Pain: Pain Assessment Pain Assessment: 0-10 Pain Score: 8  Pain Type: Acute pain Pain Location: Hip Pain Orientation: Right Pain Descriptors / Indicators: Aching Pain Onset: With Activity Pain Intervention(s): Repositioned;Rest ADL:      See Function Navigator for Current Functional Status.   Therapy/Group: Individual Therapy  Khaled Herda A Annelie Boak 02/22/2017, 3:20 PM

## 2017-02-22 NOTE — Progress Notes (Signed)
Physical Therapy Note  Patient Details  Name: Nonda LouUdell S Olds MRN: [id number] Date of Birth: June 10, 1917 Today's Date: 02/22/2017  [id number]; 5 min individual tx Pain: "not too bad" R hip; premedicated  R LE noted to be moderately edematous, with compressed area at ankle due to antislip socks.  TEDs donned by PT bilaterally.   Supine exs: 2 x 10 L short arc quad knee ext, L ankle pumps; 1 x 10 L active assistive:  ankle pumps, R short arc quad knee ext, R heel slide.  Bed mobility using bed features, mod assist with exra time.  Pt sat EOB x 5 min without dizziness.  Slide board transfer to R with mod assist.    Pt leaned over to clean dentures in sink, w/c level.  She had a large amount of food in her dentures and mouth.  Seated in w/c, 2 x 5 press ups to elevate hips, with mod assist.  Pt locked/unlocked w/c brakes with mod VCs and visual cues.  Pt left resting in w/c with quick release belt applied and all needs within reach.   See function navigator for current status.   [place] Kappes 02/22/2017, 7:52 AM

## 2017-02-22 NOTE — Progress Notes (Signed)
Subjective/Complaints: Pt remembered throwing a ball in therapy.  R knee less sore  ROS- no CP, SOB, N/V/D, + freq of urine  Objective: Vital Signs: Blood pressure (!) 147/58, pulse 92, temperature 98.1 F (36.7 C), temperature source Oral, resp. rate 16, height 4\' 10"  (1.473 m), weight 58 kg (127 lb 13.9 oz), SpO2 100 %. No results found. Results for orders placed or performed during the hospital encounter of 02/18/17 (from the past 72 hour(s))  Urinalysis, Routine w reflex microscopic     Status: Abnormal   Collection Time: 02/19/17  6:41 PM  Result Value Ref Range   Color, Urine YELLOW YELLOW   APPearance TURBID (A) CLEAR   Specific Gravity, Urine 1.010 1.005 - 1.030   pH 6.0 5.0 - 8.0   Glucose, UA NEGATIVE NEGATIVE mg/dL   Hgb urine dipstick LARGE (A) NEGATIVE   Bilirubin Urine NEGATIVE NEGATIVE   Ketones, ur NEGATIVE NEGATIVE mg/dL   Protein, ur 30 (A) NEGATIVE mg/dL   Nitrite NEGATIVE NEGATIVE   Leukocytes, UA LARGE (A) NEGATIVE  Urinalysis, Microscopic (reflex)     Status: Abnormal   Collection Time: 02/19/17  6:41 PM  Result Value Ref Range   RBC / HPF TOO NUMEROUS TO COUNT 0 - 5 RBC/hpf   WBC, UA TOO NUMEROUS TO COUNT 0 - 5 WBC/hpf   Bacteria, UA MANY (A) NONE SEEN   Squamous Epithelial / LPF 0-5 (A) NONE SEEN  Glucose, capillary     Status: Abnormal   Collection Time: 02/19/17  9:02 PM  Result Value Ref Range   Glucose-Capillary 121 (H) 65 - 99 mg/dL  Glucose, capillary     Status: Abnormal   Collection Time: 02/20/17  6:56 AM  Result Value Ref Range   Glucose-Capillary 140 (H) 65 - 99 mg/dL     HEENT: normal Cardio: RRR and no murmur Resp: CTA B/L and unlabored GI: BS positive and NT, ND Extremity:  Pulses positive and No Edema Skin:   Other RIght hip wound with post op dressing Neuro: Confused, Abnormal Sensory difficult to assess due to poor attention reports nl LT in feet, Abnormal Motor 5/5 in BUE, 4/5 LLE , 2- R HF 3- KE and ankle (pain) and Other  oriented to person and reason for hospitalization not to time or place Musc/Skel:  Swelling RIght thigh and leg swelling and Other pain with passive R HF Gen NAD   Assessment/Plan: 1. Functional deficits secondary to RIght hip subcapital fx which require 3+ hours per day of interdisciplinary therapy in a comprehensive inpatient rehab setting. Physiatrist is providing close team supervision and 24 hour management of active medical problems listed below. Physiatrist and rehab team continue to assess barriers to discharge/monitor patient progress toward functional and medical goals. FIM: Function - Bathing Position: Sitting EOB Body parts bathed by patient: Right arm, Left arm, Chest, Abdomen, Right upper leg, Left upper leg Body parts bathed by helper: Front perineal area, Buttocks, Left lower leg, Right lower leg, Back Assist Level: 2 helpers  Function- Upper Body Dressing/Undressing What is the patient wearing?: Pull over shirt/dress Pull over shirt/dress - Perfomed by patient: Put head through opening Pull over shirt/dress - Perfomed by helper: Thread/unthread right sleeve, Thread/unthread left sleeve, Pull shirt over trunk Function - Lower Body Dressing/Undressing What is the patient wearing?: Pants Position: Sitting EOB Pants- Performed by helper: Thread/unthread right pants leg, Thread/unthread left pants leg, Pull pants up/down Assist for lower body dressing: 2 Helpers  Function - Chair/bed transfer Chair/bed transfer activity did not occur: N/A Chair/bed transfer method: Lateral scoot Chair/bed transfer assist level: Moderate assist (Pt 50 - 74%/lift or lower) Chair/bed transfer assistive device: Armrests, Sliding board  Function - Locomotion: Wheelchair Type: Manual Max wheelchair distance: 20 Assist Level: Touching or steadying assistance (Pt > 75%) Assist Level: Supervision or verbal cues Wheel 150 feet activity did not occur: Safety/medical concerns Turns  around,maneuvers to table,bed, and toilet,negotiates 3% grade,maneuvers on rugs and over doorsills: No Function - Locomotion: Ambulation Ambulation activity did not occur: Safety/medical concerns Walk 10 feet activity did not occur: Safety/medical concerns Walk 50 feet with 2 turns activity did not occur: Safety/medical concerns Walk 150 feet activity did not occur: Safety/medical concerns Walk 10 feet on uneven surfaces activity did not occur: Safety/medical concerns  Function - Comprehension Comprehension: Auditory Comprehension assist level: Understands basic 90% of the time/cues < 10% of the time  Function - Expression Expression: Verbal Expression assist level: Expresses basic 50 - 74% of the time/requires cueing 25 - 49% of the time. Needs to repeat parts of sentences.  Function - Social Interaction Social Interaction assist level: Interacts appropriately 75 - 89% of the time - Needs redirection for appropriate language or to initiate interaction.  Function - Problem Solving Problem solving assist level: Solves basic 25 - 49% of the time - needs direction more than half the time to initiate, plan or complete simple activities  Function - Memory Memory assist level: Recognizes or recalls 25 - 49% of the time/requires cueing 50 - 75% of the time Patient normally able to recall (first 3 days only): That he or she is in a hospital  Medical Problem List and Plan: 1.Decreased functional mobilitysecondary to displaced right femoral neck fracture after fall. Status post right hip hemiarthroplasty anterior approach 02/15/2017. Weightbearing as tolerated no active abduction CIR PT, OT- Team conf in am Has R Knee pain with ROM- Xray no fx + tricompartmental OA will start voltaren gel 2. DVT Prophylaxis/Anticoagulation: Aspirin 81 mg twice a day. Check vascular study-Doppler Neg    3. Pain Management:Hydrocodone as needed 4. Mood:Provide emotional support 5. Neuropsych: This  patientiscapable of making decisions on herown behalf. 6. Skin/Wound Care:Routine skin checks 7. Fluids/Electrolytes/Nutrition:Routine I&O's with follow-up chemistries, enc po fluids, po 75-100% meals 8.Acute blood loss anemia. Follow-up CBC Hgb Stable 10.5 on 1/4 9.History of CVA. Continue Plavix aspirin prior to admission. 10.Diet controlled diabetes mellitus. Patient on no oral diabetic agents prior to admission.CBG (last 3)  Recent Labs    02/19/17 2102 02/20/17 0656  GLUCAP 121* 140*  Controlled 1/6  11.Hypertension. Demadex 10 mg daily. Monitor with increased mobility 12.CKDstage III, follow-up chemistries, GFR at baseline , BUN mildly elevated 13.History of dizziness. Continue Antivert as needed 14.History of gout. Zyloprim 300 mg at bedtime. Monitor for any gout flare ups 15.History of overactive bladder. Toviaz 4 mg daily. Check PVR 3 16.Hyperlipidemia. Lipitor 17.Constipation. Laxative assistance 18.  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 Ecoli UTI on Macrodantin pending cultures LOS (Days) 4 A FACE TO FACE EVALUATION WAS PERFORMED  [name] [name] [name] [name] 02/22/2017, 7:03 AM

## 2017-02-23 ENCOUNTER — Inpatient Hospital Stay (HOSPITAL_COMMUNITY): Payer: Medicare Other | Admitting: Physical Therapy

## 2017-02-23 ENCOUNTER — Inpatient Hospital Stay (HOSPITAL_COMMUNITY): Payer: Medicare Other | Admitting: Occupational Therapy

## 2017-02-23 DIAGNOSIS — S72001S Fracture of unspecified part of neck of right femur, sequela: Secondary | ICD-10-CM

## 2017-02-23 DIAGNOSIS — B9620 Unspecified Escherichia coli [E. coli] as the cause of diseases classified elsewhere: Secondary | ICD-10-CM

## 2017-02-23 DIAGNOSIS — M1711 Unilateral primary osteoarthritis, right knee: Secondary | ICD-10-CM

## 2017-02-23 DIAGNOSIS — N390 Urinary tract infection, site not specified: Secondary | ICD-10-CM

## 2017-02-23 DIAGNOSIS — I10 Essential (primary) hypertension: Secondary | ICD-10-CM

## 2017-02-23 NOTE — Progress Notes (Signed)
Occupational Therapy Session Note  Patient Details  Name: Nonda LouUdell S Trella MRN: [id number] Date of Birth: 06-02-1917  Today's Date: 02/23/2017 OT Individual Time: 1001-1103 OT Individual Time Calculation (min): 62 min    Short Term Goals: Week 1:  OT Short Term Goal 1 (Week 1): Pt will complete LB bathing with sit to stand with max assist.   OT Short Term Goal 2 (Week 1): Pt will complete LB dressing with max assist sit to stand and AE PRN. OT Short Term Goal 3 (Week 1): Pt will complete UB dressing with supervision sit to stand.  OT Short Term Goal 4 (Week 1): Pt will complete stand pivot transfer with max assist using the RW for support.    Skilled Therapeutic Interventions/Progress Updates:    Pt completed bathing and dressing at the sink.  Total assist +2 (pt 25%) for sit to stand X3 in order to wash peri area, donn new brief, and then pull pants over hips.  She was able to complete UB bathing with min assist but now pt with limited RUE shoulder AROM for washing the left arm.  On eval pt's AROM for BUEs for WFLs and for ADL.  Unsure of cause at this time but pt only able to exhibit less than 60 degrees AROM shoulder flexion.  She needed increased time for all tasks as well as therapist assist for washing below her knees, donning TEDs, and donning/doffing gripper socks.  Pt left in wheelchair at end of session with call button and phone in reach.    Therapy Documentation Precautions:  Precautions Precautions: Fall, Anterior Hip Precaution Comments: no active abduction  Restrictions Weight Bearing Restrictions: No RLE Weight Bearing: Weight bearing as tolerated  Pain: Pain Assessment Pain Assessment: Faces Faces Pain Scale: Hurts a little bit Pain Type: Surgical pain Pain Location: Leg Pain Orientation: Right Pain Intervention(s): Repositioned ADL: See Function Navigator for Current Functional Status.   Therapy/Group: Individual Therapy  Nalu Troublefield OTR/L 02/23/2017, 12:42  PM

## 2017-02-23 NOTE — Progress Notes (Signed)
Occupational Therapy Session Note  Patient Details  Name: [name] [name] MRN: [id number] Date of Birth: [birth year]  Today's Date: 02/23/2017 OT Individual Time: 1450-1530 OT Individual Time Calculation (min): 40 min    Short Term Goals: Week 1:  OT Short Term Goal 1 (Week 1): Pt will complete LB bathing with sit to stand with max assist.   OT Short Term Goal 2 (Week 1): Pt will complete LB dressing with max assist sit to stand and AE PRN. OT Short Term Goal 3 (Week 1): Pt will complete UB dressing with supervision sit to stand.  OT Short Term Goal 4 (Week 1): Pt will complete stand pivot transfer with max assist using the RW for support.    Skilled Therapeutic Interventions/Progress Updates:    Pt finished lunch at beginning of session with setup of grape juice.  Next, therapist and rehab tech took pt down to the therapy gym where she worked on sit to stand transitions and standing balance at the side of the parallel bars, with mirror for feedback.  Pt completed 2 intervals of standing for 3-4 mins each.  Total assist +2 (pt 25%) for sit to stand initially.  Once standing she needed assistance to place the RUE on the bar but she, did need initial max assist to let go of the wheelchair with the LUE and transition to the bar.  Pt demonstrates increased trunk flexion both forward and laterally to the left side in standing.  After using the mirror, and increased standing time, she was able to demonstrate more upright midline standing.   At one point she was able to maintain standing with BUEs on the bar with min guard assist for a few seconds before transitioning back to sitting.  Returned to room at end of session with pt completing sliding board transfer to the bed from the wheelchair with overall mod assist, after placement of the board.    Therapy Documentation Precautions:  Precautions Precautions: Fall, Anterior Hip Precaution Comments: no active abduction  Restrictions Weight Bearing  Restrictions: No RLE Weight Bearing: Weight bearing as tolerated  Pain: Pain Assessment Pain Assessment: Faces Faces Pain Scale: Hurts little more Pain Type: Surgical pain Pain Location: Leg Pain Orientation: Right Pain Descriptors / Indicators: Discomfort Pain Onset: With Activity Pain Intervention(s): Emotional support;Repositioned ADL: See Function Navigator for Current Functional Status.   Therapy/Group: Individual Therapy  Clevie Prout OTR/L 02/23/2017, 4:29 PM

## 2017-02-23 NOTE — Plan of Care (Signed)
  Not Progressing RH BLADDER ELIMINATION RH STG MANAGE BLADDER WITH ASSISTANCE Description STG Manage Bladder With Min Assistance  02/23/2017 1554 - Not Progressing by Alfonso RamusEvans, Melvern Ramone S, RN Note Total assist- incontinent at times

## 2017-02-23 NOTE — Progress Notes (Signed)
Physical Therapy Session Note  Patient Details  Name: [name] [name] MRN: [id number] Date of Birth: 02/20/1917  Today's Date: 02/23/2017 PT Individual Time: 0900-0955 AND 1305-1330 PT Individual Time Calculation (min): 55 min AND 25 min  Short Term Goals: Week 1:  PT Short Term Goal 1 (Week 1): pt will perform bed mobility with mod assis t PT Short Term Goal 2 (Week 1): Pt will initiate gait training  PT Short Term Goal 3 (Week 1): Pt will perform sit<>stand transfer with Mod assist  PT Short Term Goal 4 (Week 1): Pt will perform bed<>WC transfers with mod assist consistently  PT Short Term Goal 5 (Week 1): Pt will propell WC 150ft with supervision assist   Skilled Therapeutic Interventions/Progress Updates:   Session 1:  Pt supine upon arrival and agreeable to therapy, denies pain however grimaces w/ any active or passive movements of RLE. Transferred to EOB w/ max assist overall requiring increasing time 2/2 RLE pain and manual/verbal cues required for transfer. Maintained static sitting balance w/ min guard while sitting at EOB to eat breakfast. Manual/tactile cues for use of UEs to drink from cup and utilize eating utensils. Mod assist to pick up food w/ utensils. Increased fatigue w/ unsupported sitting, transferred to w/c via slide board transfer max assist and increased time. Verbal cues to reposition in w/c using armrests. Ended session sitting up in w/c, finishing breakfast. Call bell within reach and all needs met.   Session 2:  Pt in w/c upon arrival and agreeable to therapy, no c/o pain. Pt noted to still have lunch tray uneaten. Engaged in upright task utilizing both UEs to each lunch w/ verbal cues for encouragement. Verbal cues to utilize both UEs in reaching tasks across tray. Additionally transferred to new w/c via slide board transfer w/ max assist for RLE management and manual facilitation of weight shifting. New w/c had armrests that fit more appropriately to pt's size. Ended  session in w/c, call bell within reach and all needs met.   Therapy Documentation Precautions:  Precautions Precautions: Fall, Anterior Hip Precaution Comments: no active abduction  Restrictions Weight Bearing Restrictions: Yes RLE Weight Bearing: Weight bearing as tolerated  See Function Navigator for Current Functional Status.   Therapy/Group: Individual Therapy   K Arnette 02/23/2017, 9:44 AM  

## 2017-02-23 NOTE — Progress Notes (Signed)
Subjective/Complaints: Pt seen lying in bed this AM.  She slept well overnight.    ROS: Denies CP, SOB, N/V/D  Objective: Vital Signs: Blood pressure (!) 146/63, pulse 90, temperature (!) 97.5 F (36.4 C), temperature source Oral, resp. rate 18, height 4\' 10"  (1.473 m), weight 58 kg (127 lb 13.9 oz), SpO2 99 %. No results found. No results found for this or any previous visit (from the past 72 hour(s)).   Gen NAD. Vital signs reviewed. HEENT: normocephalic, atraumatic Cardio: RRR and no JVD Resp: CTA B/L and unlabored GI: BS positive and ND Musc/Skel:  Swelling RIght thigh and leg swelling and Other pain with passive R HF Neuro: Alert and Confused  Motor 5/5 in BUE 4/5 LLE 2-/5 R HF 3-/5 KE and ankle doris/plantar flexion(pain) and Other oriented to person and reason for hospitalization not to time or place Skin:   Right hip incision with dressing c/d/i   Assessment/Plan: 1. Functional deficits secondary to RIght hip subcapital fx which require 3+ hours per day of interdisciplinary therapy in a comprehensive inpatient rehab setting. Physiatrist is providing close team supervision and 24 hour management of active medical problems listed below. Physiatrist and rehab team continue to assess barriers to discharge/monitor patient progress toward functional and medical goals. FIM: Function - Bathing Position: Sitting EOB Body parts bathed by patient: Right arm, Left arm, Chest, Abdomen, Right upper leg, Left upper leg Body parts bathed by helper: Front perineal area, Buttocks, Left lower leg, Right lower leg, Back Assist Level: 2 helpers  Function- Upper Body Dressing/Undressing What is the patient wearing?: Pull over shirt/dress Pull over shirt/dress - Perfomed by patient: Put head through opening Pull over shirt/dress - Perfomed by helper: Thread/unthread right sleeve, Thread/unthread left sleeve, Pull shirt over trunk Function - Lower Body Dressing/Undressing What is the  patient wearing?: Pants, Non-skid slipper socks Position: Sitting EOB Pants- Performed by helper: Thread/unthread right pants leg, Thread/unthread left pants leg, Pull pants up/down Non-skid slipper socks- Performed by helper: Don/doff right sock, Don/doff left sock Assist for footwear: Dependant Assist for lower body dressing: 2 Helpers  Function - Toileting Toileting steps completed by helper: Adjust clothing prior to toileting, Performs perineal hygiene, Adjust clothing after toileting(per Laureen Abrahams, NT) Assist level: Two helpers(per Eliseo Squires, NT and Laureen Abrahams, NT)  Function - Toilet Transfers Toilet transfer activity did not occur: Safety/medical concerns Toilet transfer assistive device: Drop arm commode Assist level to toilet: Maximal assist (Pt 25 - 49%/lift and lower) Assist level from toilet: Maximal assist (Pt 25 - 49%/lift and lower)  Function - Chair/bed transfer Chair/bed transfer method: Lateral scoot Chair/bed transfer assist level: Moderate assist (Pt 50 - 74%/lift or lower) Chair/bed transfer assistive device: Sliding board Chair/bed transfer details: Manual facilitation for placement, Manual facilitation for weight shifting, Verbal cues for safe use of DME/AE, Verbal cues for precautions/safety  Function - Locomotion: Wheelchair Type: Manual Max wheelchair distance: 20 Assist Level: Touching or steadying assistance (Pt > 75%) Assist Level: Supervision or verbal cues Wheel 150 feet activity did not occur: Safety/medical concerns Turns around,maneuvers to table,bed, and toilet,negotiates 3% grade,maneuvers on rugs and over doorsills: No Function - Locomotion: Ambulation Ambulation activity did not occur: Safety/medical concerns Walk 10 feet activity did not occur: Safety/medical concerns Walk 50 feet with 2 turns activity did not occur: Safety/medical concerns Walk 150 feet activity did not occur: Safety/medical concerns Walk 10 feet on uneven surfaces  activity did not occur: Safety/medical concerns  Function - Comprehension Comprehension: Auditory Comprehension assist  level: Understands basic 90% of the time/cues < 10% of the time  Function - Expression Expression: Verbal Expression assist level: Expresses basic 50 - 74% of the time/requires cueing 25 - 49% of the time. Needs to repeat parts of sentences.  Function - Social Interaction Social Interaction assist level: Interacts appropriately 75 - 89% of the time - Needs redirection for appropriate language or to initiate interaction.  Function - Problem Solving Problem solving assist level: Solves basic 50 - 74% of the time/requires cueing 25 - 49% of the time  Function - Memory Memory assist level: Recognizes or recalls 25 - 49% of the time/requires cueing 50 - 75% of the time Patient normally able to recall (first 3 days only): That he or she is in a hospital  Medical Problem List and Plan: 1.Decreased functional mobilitysecondary to displaced right femoral neck fracture after fall. Status post right hip hemiarthroplasty anterior approach 02/15/2017. Weightbearing as tolerated no active abduction  Cont CIR    Notes reviewed, images reviewed 2. DVT Prophylaxis/Anticoagulation: Aspirin 81 mg twice a day.    Vascular study- Neg     3. Pain Management:Hydrocodone as needed 4. Mood:Provide emotional support 5. Neuropsych: This patientisnot fully capable of making decisions on herown behalf. 6. Skin/Wound Care:Routine skin checks 7. Fluids/Electrolytes/Nutrition:Routine I&O's   Enc po fluids 8.Acute blood loss anemia.    Hgb 10.5 on 1/4 9.History of CVA. Continue Plavix aspirin prior to admission. 10.Diet controlled diabetes mellitus. Patient on no oral diabetic agents prior to admission.CBG (last 3)  No results for input(s): GLUCAP in the last 72 hours.    Controlled 1/6 11.Hypertension. Demadex 10 mg daily. Monitor with increased mobility   Relatively controlled  on 1/8 12.CKDstage III    Cr 1/31 on 1/4   Cont to monitor 13.History of dizziness. Continue Antivert as needed 14.History of gout. Zyloprim 300 mg at bedtime. Monitor for any gout flare ups 15.History of overactive bladder. Toviaz 4 mg daily.  16.Hyperlipidemia. Lipitor 17.Constipation. Laxative assistance 18.  Restlessness and confusion- hx of R PICA and R opercular infarcts 2013 with SVD, MID with sundowning    Improved with seroquel qhs   Confounded by UTI 19. Ecoli UTI    Cont Macrodantin 1/5-1/11 20. Right knee OA    Xray reviewed, showing OA    Started on voltaren gel  LOS (Days) 5 A FACE TO FACE EVALUATION WAS PERFORMED  Ankit Karis Jubanil Patel 02/23/2017, 7:50 AM

## 2017-02-24 ENCOUNTER — Inpatient Hospital Stay (HOSPITAL_COMMUNITY): Payer: Medicare Other | Admitting: Physical Therapy

## 2017-02-24 ENCOUNTER — Inpatient Hospital Stay (HOSPITAL_COMMUNITY): Payer: Medicare Other | Admitting: Occupational Therapy

## 2017-02-24 DIAGNOSIS — N183 Chronic kidney disease, stage 3 unspecified: Secondary | ICD-10-CM

## 2017-02-24 MED ORDER — QUETIAPINE FUMARATE 25 MG PO TABS
12.5000 mg | ORAL_TABLET | Freq: Every day | ORAL | Status: DC
  Administered 2017-02-24 – 2017-02-25 (×2): 12.5 mg via ORAL
  Filled 2017-02-24 (×2): qty 1

## 2017-02-24 NOTE — Progress Notes (Signed)
Social Work   Rider Ermis, Elveria Risingebecca G, LCSW  Social Worker  Physical Medicine and Rehabilitation  Patient Care Conference  Signed  Date of Service:  02/24/2017  3:24 PM          Signed          [] Hide copied text  [] Hover for details   Inpatient RehabilitationTeam Conference and Plan of Care Update Date: 02/24/2017   Time: 2:00 PM      Patient Name: Nonda LouUdell S Summerlin      Medical Record Number: [id number]  Date of Birth: 1917/03/10 Sex: Female         Room/Bed: 4M01C/4M01C-01 Payor Info: Payor: MEDICARE / Plan: MEDICARE PART A AND B / Product Type: *No Product type* /     Admitting Diagnosis: Rt Hip FX R thr  Admit Date/Time:  02/18/2017  4:25 PM Admission Comments: No comment available    Primary Diagnosis:  <principal problem not specified> Principal Problem: <principal problem not specified>       Patient Active Problem List    Diagnosis Date Noted  . Stage 3 chronic kidney disease (HCC)    . E-coli UTI    . Primary osteoarthritis of right knee    . Benign essential HTN    . Femoral neck fracture (HCC) 02/18/2017  . H/O: CVA (cerebrovascular accident)    . Post-operative pain    . Acute blood loss anemia    . Tachycardia    . Steroid-induced hyperglycemia    . Thrombocytopenia (HCC)    . Closed right hip fracture, initial encounter (HCC) 02/15/2017  . Atelectasis of left lung 02/15/2017  . Displaced fracture of right femoral neck (HCC) 02/15/2017  . Nausea and vomiting 03/09/2011  . Diabetes mellitus, type II (HCC) 03/09/2011  . Hyperkalemia 03/09/2011  . Acute renal failure (HCC) 03/09/2011  . Hypotension 03/09/2011  . Anemia 03/09/2011  . Coagulopathy (HCC) 03/09/2011  . HTN (hypertension) 02/19/2011  . Dizziness 02/19/2011  . Cerebellar infarct (HCC) 02/19/2011  . Bradycardia 02/19/2011  . H/O: CVA (cardiovascular accident) 02/19/2011  . SPONDYLOSIS 09/18/2009  . SCIATICA 09/18/2009  . SCOLIOSIS-IDIOPATHIC 09/18/2009  . SPONDYLOLITHESIS 09/18/2009        Expected Discharge Date:     Team Members Present: Physician leading conference: Dr. Maryla MorrowAnkit Patel Social Worker Present: Dossie DerBecky Preesha Benjamin, LCSW Nurse Present: Allayne Stackhelsey Evans, RN PT Present: Midge MiniumBen Zaino, PT OT Present: Perrin MalteseJames McGuire, OT SLP Present: Jackalyn LombardNicole Page, SLP PPS Coordinator present : Tora DuckMarie Noel, RN, CRRN       Current Status/Progress Goal Weekly Team Focus  Medical     Decreased functional mobility secondary to displaced right femoral neck fracture after fall. Status post right hip hemiarthroplasty anterior approach 02/15/2017.   Improve mobility, transfers, ADLs, UTI, CKD  See above   Bowel/Bladder     (P) Incontinent at night  (P) Timed toileting to prevent incontinent episodes  (P) Timed toileting every 2-3 hours   Swallow/Nutrition/ Hydration               ADL's     Min assist for UB bathing with max assist for UB dressing.  Total assist +2 for LB selfcare sit to stand with mod to max for sliding board transfers.  min assist level overall  selfcare retraining, transfer training, balance retraining, pt/family education   Mobility     Mod-Max assist overall for bed mobility and slide board transfers, have not initiated gait, total assist to stand, supervision w/c mobility in short distances  min  assist overall  increase tolerance to active RLE movement during functional mobility including WB in standing, increase tolerance to standing, endurance, transfers and bed mobility strength    Communication               Safety/Cognition/ Behavioral Observations   (P) Alert and oriented x2-3. 2 assist with slideboard  (P) No injuries with transfers  (P) Re-orient patient when necessary   Pain     (P) C/O discomfort  to the right hip. Tramadol has been ordered. Last dose at bedtime  (P) Pain level of 3 or less  (P) Assess pain level every shift and address accordingly   Skin                 *See Care Plan and progress notes for long and short-term goals.      Barriers to Discharge    Current Status/Progress Possible Resolutions Date Resolved   Physician     Medical stability;Decreased caregiver support;Home environment access/layout;Lack of/limited family support     See above  Therapies, abx for UTI, follow labs      Nursing                 PT  Decreased caregiver support;Home environment access/layout  lives w/ son, may not be able to provide level of physical assistance pt will likely need at discharge              OT                 SLP            SW              Discharge Planning/Teaching Needs:  Will not be able to return home due to needing physical assist and son is unable to provide this. Will discuss with family and begin the NH search      Team Discussion:  Goals of min assist unsure if will meet these. Will need 24 hr physical care at discharge. Very slow in all of her activities and fatigues easily due to age and activity tolerance. Fear of falling with standing and requires plus 2 assist. Pain in right leg-MD aware. Treating her for UTI. Voltaren for shoulder and knee pain. Will need NHP when medically stable for discharge  Revisions to Treatment Plan:  NHP    Continued Need for Acute Rehabilitation Level of Care: The patient requires daily medical management by a physician with specialized training in physical medicine and rehabilitation for the following conditions: Daily direction of a multidisciplinary physical rehabilitation program to ensure safe treatment while eliciting the highest outcome that is of practical value to the patient.: Yes Daily medical management of patient stability for increased activity during participation in an intensive rehabilitation regime.: Yes Daily analysis of laboratory values and/or radiology reports with any subsequent need for medication adjustment of medical intervention for : Post surgical problems;Other   [name] 02/24/2017, 3:25 PM                 Patient ID: Nonda Lou, female   DOB:  1918/02/13, 84 y.o.   MRN: [id number]

## 2017-02-24 NOTE — Patient Care Conference (Signed)
Inpatient RehabilitationTeam Conference and Plan of Care Update Date: 02/24/2017   Time: 2:00 PM    Patient Name: [name] [name]      Medical Record Number: [id number]  Date of Birth: [birth year] Sex: Female         Room/Bed: 4M01C/4M01C-01 Payor Info: Payor: MEDICARE / Plan: MEDICARE PART A AND B / Product Type: *No Product type* /    Admitting Diagnosis: Rt Hip FX R thr  Admit Date/Time:  02/18/2017  4:25 PM Admission Comments: No comment available   Primary Diagnosis:  <principal problem not specified> Principal Problem: <principal problem not specified>  Patient Active Problem List   Diagnosis Date Noted  . Stage 3 chronic kidney disease (HCC)   . E-coli UTI   . Primary osteoarthritis of right knee   . Benign essential HTN   . Femoral neck fracture (HCC) 02/18/2017  . H/O: CVA (cerebrovascular accident)   . Post-operative pain   . Acute blood loss anemia   . Tachycardia   . Steroid-induced hyperglycemia   . Thrombocytopenia (HCC)   . Closed right hip fracture, initial encounter (HCC) 02/15/2017  . Atelectasis of left lung 02/15/2017  . Displaced fracture of right femoral neck (HCC) 02/15/2017  . Nausea and vomiting 03/09/2011  . Diabetes mellitus, type II (HCC) 03/09/2011  . Hyperkalemia 03/09/2011  . Acute renal failure (HCC) 03/09/2011  . Hypotension 03/09/2011  . Anemia 03/09/2011  . Coagulopathy (HCC) 03/09/2011  . HTN (hypertension) 02/19/2011  . Dizziness 02/19/2011  . Cerebellar infarct (HCC) 02/19/2011  . Bradycardia 02/19/2011  . H/O: CVA (cardiovascular accident) 02/19/2011  . SPONDYLOSIS 09/18/2009  . SCIATICA 09/18/2009  . SCOLIOSIS-IDIOPATHIC 09/18/2009  . SPONDYLOLITHESIS 09/18/2009    Expected Discharge Date:    Team Members Present: Physician leading conference: Dr. Maryla MorrowAnkit Patel Social Worker Present: Dossie DerBecky Ronne Stefanski, LCSW Nurse Present: Allayne Stackhelsey Evans, RN PT Present: Midge MiniumBen Zaino, PT OT Present: Perrin MalteseJames McGuire, OT SLP Present: Jackalyn LombardNicole Page, SLP PPS  Coordinator present : Tora DuckMarie Noel, RN, CRRN     Current Status/Progress Goal Weekly Team Focus  Medical   Decreased functional mobility secondary to displaced right femoral neck fracture after fall. Status post right hip hemiarthroplasty anterior approach 02/15/2017.   Improve mobility, transfers, ADLs, UTI, CKD  See above   Bowel/Bladder   (P) Incontinent at night  (P) Timed toileting to prevent incontinent episodes  (P) Timed toileting every 2-3 hours   Swallow/Nutrition/ Hydration             ADL's   Min assist for UB bathing with max assist for UB dressing.  Total assist +2 for LB selfcare sit to stand with mod to max for sliding board transfers.  min assist level overall  selfcare retraining, transfer training, balance retraining, pt/family education   Mobility   Mod-Max assist overall for bed mobility and slide board transfers, have not initiated gait, total assist to stand, supervision w/c mobility in short distances  min assist overall  increase tolerance to active RLE movement during functional mobility including WB in standing, increase tolerance to standing, endurance, transfers and bed mobility strength    Communication             Safety/Cognition/ Behavioral Observations  (P) Alert and oriented x2-3. 2 assist with slideboard  (P) No injuries with transfers  (P) Re-orient patient when necessary   Pain   (P) C/O discomfort  to the right hip. Tramadol has been ordered. Last dose at bedtime  (P) Pain level of 3 or  less  (P) Assess pain level every shift and address accordingly   Skin                *See Care Plan and progress notes for long and short-term goals.     Barriers to Discharge  Current Status/Progress Possible Resolutions Date Resolved   Physician    Medical stability;Decreased caregiver support;Home environment access/layout;Lack of/limited family support     See above  Therapies, abx for UTI, follow labs      Nursing                  PT  Decreased  caregiver support;Home environment access/layout  lives w/ son, may not be able to provide level of physical assistance pt will likely need at discharge              OT                  SLP                SW                Discharge Planning/Teaching Needs:  Will not be able to return home due to needing physical assist and son is unable to provide this. Will discuss with family and begin the NH search      Team Discussion:  Goals of min assist unsure if will meet these. Will need 24 hr physical care at discharge. Very slow in all of her activities and fatigues easily due to age and activity tolerance. Fear of falling with standing and requires plus 2 assist. Pain in right leg-MD aware. Treating her for UTI. Voltaren for shoulder and knee pain. Will need NHP when medically stable for discharge  Revisions to Treatment Plan:  NHP    Continued Need for Acute Rehabilitation Level of Care: The patient requires daily medical management by a physician with specialized training in physical medicine and rehabilitation for the following conditions: Daily direction of a multidisciplinary physical rehabilitation program to ensure safe treatment while eliciting the highest outcome that is of practical value to the patient.: Yes Daily medical management of patient stability for increased activity during participation in an intensive rehabilitation regime.: Yes Daily analysis of laboratory values and/or radiology reports with any subsequent need for medication adjustment of medical intervention for : Post surgical problems;Other  [name] 02/24/2017, 3:25 PM

## 2017-02-24 NOTE — Progress Notes (Signed)
Physical Therapy Session Note  Patient Details  Name: [name] [name] MRN: [id number] Date of Birth: [birth year]  Today's Date: 02/24/2017 PT Individual Time: 2111-5520 PT Individual Time Calculation (min): 40 min   Short Term Goals: Week 1:  PT Short Term Goal 1 (Week 1): pt will perform bed mobility with mod assis t PT Short Term Goal 2 (Week 1): Pt will initiate gait training  PT Short Term Goal 3 (Week 1): Pt will perform sit<>stand transfer with Mod assist  PT Short Term Goal 4 (Week 1): Pt will perform bed<>WC transfers with mod assist consistently  PT Short Term Goal 5 (Week 1): Pt will propell WC 120f with supervision assist   Skilled Therapeutic Interventions/Progress Updates: Pt presented in bed eating breakfast. Pt agreeable to participate in therapy and resume breakfast after session. PTA encouraged pt to perform ankle pumps to fatigue, pt participated in QS prior to bed mobility. Performed rolling L/R while PTA donned and pulled up pants total assist. Pt c/o of R knee pain during rolling (L and R) unable to provide numerical assessment. Pt required min to mod A for rolling L/R and cues for use of bed rail and use of LLE to facilitate rolling to R. Performed supine to sit modA with increased time and total assist for scooting to EOB. Pt performed SB transfer to R modA with tactile cues for increasing lateral lean to facilitate transfer. Total assist for repositioning in chair. Pt participated in AWinchesterto improve range and pillow squeezes to fatigue. Pt indicating decreased knee pain after ROM and activity.Pt left in w/c to complete breakfast with call bell within reach and current needs met.      Therapy Documentation Precautions:  Precautions Precautions: Fall, Anterior Hip Precaution Comments: no active abduction  Restrictions Weight Bearing Restrictions: No RLE Weight Bearing: Weight bearing as tolerated Other Position/Activity Restrictions: NO active ABDuction   See  Function Navigator for Current Functional Status.   Therapy/Group: Individual Therapy  Uchechi Denison  Kaya Klausing, PTA  02/24/2017, 1:02 PM

## 2017-02-24 NOTE — Progress Notes (Signed)
Social Work Patient ID: [name] [name], female   DOB: [birth year], 84 y.o.   MRN: [id number]  Met with pt and Zenda Alpers to discuss team conference goals min assist level and need for 24 hr physical care at discharge. Son voiced he can not provide this level of care and will need to go to a NH. Son would like a place in [place] since they live there. Will pursue this and begin the NH process. Pt feels this program is too much for her and son agrees.

## 2017-02-24 NOTE — Progress Notes (Signed)
Physical Therapy Session Note  Patient Details  Name: [name] [name] MRN: [id number] Date of Birth: 05-22-1917  Today's Date: 02/24/2017 PT Individual Time: 1505-1600 PT Individual Time Calculation (min): 55 min   Short Term Goals: Week 1:  PT Short Term Goal 1 (Week 1): pt will perform bed mobility with mod assis t PT Short Term Goal 2 (Week 1): Pt will initiate gait training  PT Short Term Goal 3 (Week 1): Pt will perform sit<>stand transfer with Mod assist  PT Short Term Goal 4 (Week 1): Pt will perform bed<>WC transfers with mod assist consistently  PT Short Term Goal 5 (Week 1): Pt will propell WC 153f with supervision assist   Skilled Therapeutic Interventions/Progress Updates:   Pt received sitting in recliner and agreeable to PT. Sit<>stand with stedy and mod assist to initiate and max assist for adequate hip extension to place seat pads. [facility] Min assist to sit into WC. SB transfer with min-mod assist to mat table in rehab gym. Sit>supine with max assist. Supine BLE therex; glute sets, quat sets, heel slides, SLR all completed x 6 RLE and x 10 LLE. Multiple prolonged rest breaks following each exercise with the RLE. Supine>suit with mod assist from PT. Sitting EOB LAQ x 8 BLE. Sit<>stand from elevated height. Sit<>stand from stedy x 8 with min assist from PT, cues for increased WB throughout the RLE, but pt actively resisting weight shift to the R. Patient returned to room and [facility] reach and all needs met.          Therapy Documentation Precautions:  Precautions Precautions: Fall, Anterior Hip Precaution Comments: no active abduction  Restrictions Weight Bearing Restrictions: No RLE Weight Bearing: Weight bearing as tolerated Other Position/Activity Restrictions: NO active ABDuction Vital Signs: Therapy Vitals Temp: 97.9 F (36.6 C) Temp Source: Oral Pulse Rate: 83 Resp: 16 BP: (!) 148/68 Patient Position (if  appropriate): Sitting Oxygen Therapy SpO2: 100 % O2 Device: Not Delivered Pain:   0/10 at rest   See Function Navigator for Current Functional Status.   Therapy/Group: Individual Therapy  ALorie Phenix1/10/2017, 4:04 PM

## 2017-02-24 NOTE — Progress Notes (Signed)
Subjective/Complaints: Pt seen lying in bed this AM.  She slept well overnight.  She notes soreness in her RLE.  ROS: Denies CP, SOB, N/V/D  Objective: Vital Signs: Blood pressure (!) 131/53, pulse 97, temperature 98.1 F (36.7 C), temperature source Oral, resp. rate 16, height 4\' 10"  (1.473 m), weight 58.1 kg (128 lb 2.2 oz), SpO2 99 %. No results found. No results found for this or any previous visit (from the past 72 hour(s)).   Gen NAD. Vital signs reviewed. HEENT: normocephalic, atraumatic Cardio: RRR and no JVD Resp: CTA B/L and unlabored GI: BS positive and ND Musc/Skel:  Edema and tenderness right thigh  Neuro: Alert  Motor 5/5 in BUE 2+/5 LLE 2-/5 R HF 3-/5 KE and ankle doris/plantar flexion(pain)  Skin: Right hip incision with dressing c/d/i   Assessment/Plan: 1. Functional deficits secondary to RIght hip subcapital fx which require 3+ hours per day of interdisciplinary therapy in a comprehensive inpatient rehab setting. Physiatrist is providing close team supervision and 24 hour management of active medical problems listed below. Physiatrist and rehab team continue to assess barriers to discharge/monitor patient progress toward functional and medical goals. FIM: Function - Bathing Position: Wheelchair/chair at sink Body parts bathed by patient: Right arm, Chest, Abdomen, Right upper leg, Left upper leg Body parts bathed by helper: Right arm, Front perineal area, Buttocks, Right lower leg, Left lower leg, Back Assist Level: 2 helpers  Function- Upper Body Dressing/Undressing What is the patient wearing?: Pull over shirt/dress Pull over shirt/dress - Perfomed by patient: Thread/unthread right sleeve, Thread/unthread left sleeve Pull over shirt/dress - Perfomed by helper: Put head through opening, Pull shirt over trunk Function - Lower Body Dressing/Undressing What is the patient wearing?: Pants, Non-skid slipper socks, Ted Hose Position: Wheelchair/chair at  [employer]- Performed by helper: Thread/unthread right pants leg, Thread/unthread left pants leg, Pull pants up/down Non-skid slipper socks- Performed by helper: Don/doff right sock, Don/doff left sock TED Hose - Performed by helper: Don/doff right TED hose, Don/doff left TED hose Assist for footwear: Dependant Assist for lower body dressing: 2 Helpers  Function - Toileting Toileting steps completed by helper: Adjust clothing prior to toileting, Performs perineal hygiene, Adjust clothing after toileting Assist level: Two helpers(per Eliseo Squires, NT and Laureen Abrahams, NT)  Function - Toilet Transfers Toilet transfer activity did not occur: Safety/medical concerns Toilet transfer assistive device: Drop arm commode Assist level to toilet: Maximal assist (Pt 25 - 49%/lift and lower) Assist level from toilet: Maximal assist (Pt 25 - 49%/lift and lower)  Function - Chair/bed transfer Chair/bed transfer method: Lateral scoot Chair/bed transfer assist level: Maximal assist (Pt 25 - 49%/lift and lower) Chair/bed transfer assistive device: Sliding board, Armrests Chair/bed transfer details: Manual facilitation for placement, Manual facilitation for weight shifting, Verbal cues for safe use of DME/AE, Verbal cues for precautions/safety  Function - Locomotion: Wheelchair Type: Manual Max wheelchair distance: 20 Assist Level: Touching or steadying assistance (Pt > 75%) Assist Level: Supervision or verbal cues Wheel 150 feet activity did not occur: Safety/medical concerns Turns around,maneuvers to table,bed, and toilet,negotiates 3% grade,maneuvers on rugs and over doorsills: No Function - Locomotion: Ambulation Ambulation activity did not occur: Safety/medical concerns Walk 10 feet activity did not occur: Safety/medical concerns Walk 50 feet with 2 turns activity did not occur: Safety/medical concerns Walk 150 feet activity did not occur: Safety/medical concerns Walk 10 feet on uneven  surfaces activity did not occur: Safety/medical concerns  Function - Comprehension Comprehension: Auditory Comprehension assist level: Understands basic 90%  of the time/cues < 10% of the time  Function - Expression Expression: Verbal Expression assist level: Expresses basic 50 - 74% of the time/requires cueing 25 - 49% of the time. Needs to repeat parts of sentences.  Function - Social Interaction Social Interaction assist level: Interacts appropriately 75 - 89% of the time - Needs redirection for appropriate language or to initiate interaction.  Function - Problem Solving Problem solving assist level: Solves basic 50 - 74% of the time/requires cueing 25 - 49% of the time  Function - Memory Memory assist level: Recognizes or recalls 25 - 49% of the time/requires cueing 50 - 75% of the time Patient normally able to recall (first 3 days only): That he or she is in a hospital  Medical Problem List and Plan: 1.Decreased functional mobilitysecondary to displaced right femoral neck fracture after fall. Status post right hip hemiarthroplasty anterior approach 02/15/2017. Weightbearing as tolerated no active abduction  Cont CIR  2. DVT Prophylaxis/Anticoagulation: Aspirin 81 mg twice a day.    Vascular study- Neg     3. Pain Management:Hydrocodone as needed 4. Mood:Provide emotional support 5. Neuropsych: This patientisnot fully capable of making decisions on herown behalf. 6. Skin/Wound Care:Routine skin checks 7. Fluids/Electrolytes/Nutrition:Routine I&O's   Enc po fluids 8.Acute blood loss anemia.    Hgb 10.5 on 1/4   Labs ordered for tomorrow  9.History of CVA. Continue Plavix aspirin prior to admission. 10.Diet controlled diabetes mellitus. Patient on no oral diabetic agents prior to admission.CBG (last 3)  No results for input(s): GLUCAP in the last 72 hours.    Controlled 1/6 11.Hypertension. Demadex 10 mg daily. Monitor with increased mobility   Relatively  controlled on 1/9 12.CKDstage III    Cr 1.31 on 1/4   Labs ordered for tomorrow    Cont to monitor 13.History of dizziness. Continue Antivert as needed 14.History of gout. Zyloprim 300 mg at bedtime. Monitor for any gout flare ups 15.History of overactive bladder. Toviaz 4 mg daily.  16.Hyperlipidemia. Lipitor 17.Constipation. Laxative assistance 18.  Restlessness and confusion- hx of R PICA and R opercular infarcts 2013 with SVD, MID with sundowning    Improved with seroquel qhs, decreased to 12.5 on 1/9   Confounded by UTI 19. Ecoli UTI    Cont Macrodantin 1/5-1/11 20. Right knee OA    Xray reviewed, showing OA    Started on voltaren gel  LOS (Days) 6 A FACE TO FACE EVALUATION WAS PERFORMED  Ankit Karis Jubanil Patel 02/24/2017, 7:50 AM

## 2017-02-24 NOTE — Progress Notes (Signed)
Occupational Therapy Session Note  Patient Details  Name: [name] [name] MRN: [id number] Date of Birth: 22-Mar-1917  Today's Date: 02/24/2017 OT Individual Time: 1000-1059 OT Individual Time Calculation (min): 59 min    Short Term Goals: Week 1:  OT Short Term Goal 1 (Week 1): Pt will complete LB bathing with sit to stand with max assist.   OT Short Term Goal 2 (Week 1): Pt will complete LB dressing with max assist sit to stand and AE PRN. OT Short Term Goal 3 (Week 1): Pt will complete UB dressing with supervision sit to stand.  OT Short Term Goal 4 (Week 1): Pt will complete stand pivot transfer with max assist using the RW for support.    Skilled Therapeutic Interventions/Progress Updates:    Pt completed bathing and dressing sit to stand at the sink during session.  Max assist needed for removal of UB clothing to wash with min assist needed to complete UB bathing.  Max assist for donning bra with mod assist for pullover shirt.  Pt did not attempt LB bathing and dressing secondary to decreased time and the need to toilet.  She needed total assist +2 for sit to stand in order for therapy tech to help pull down the brief and pants and bring 3:1 behind us for use.  She was able to successfully relieve her bladder but needed total +2 (pt 25%) for sit to stand and standing balance in order to wash per area, donn new brief, and pull pants over her hips.  Finished session with pt in wheelchair with call button and phone in reach and safety belt in place.    Therapy Documentation Precautions:  Precautions Precautions: Fall, Anterior Hip Precaution Comments: no active abduction  Restrictions Weight Bearing Restrictions: No RLE Weight Bearing: Weight bearing as tolerated Other Position/Activity Restrictions: NO active ABDuction  Pain: Pain Assessment Pain Assessment: Faces Faces Pain Scale: Hurts little more Pain Type: Surgical pain Pain Location: Hip Pain Orientation: Right Pain  Intervention(s): Repositioned ADL: See Function Navigator for Current Functional Status.   Therapy/Group: Individual Therapy  Lauralynn Loeb OTR/L 02/24/2017, 12:36 PM

## 2017-02-24 NOTE — Progress Notes (Signed)
Occupational Therapy Session Note  Patient Details  Name: [name] [name] MRN: [id number] Date of Birth: [birth year]  Today's Date: 02/24/2017 OT Individual Time: 1130-1200 OT Individual Time Calculation (min): 30 min    Short Term Goals: Week 1:  OT Short Term Goal 1 (Week 1): Pt will complete LB bathing with sit to stand with max assist.   OT Short Term Goal 2 (Week 1): Pt will complete LB dressing with max assist sit to stand and AE PRN. OT Short Term Goal 3 (Week 1): Pt will complete UB dressing with supervision sit to stand.  OT Short Term Goal 4 (Week 1): Pt will complete stand pivot transfer with max assist using the RW for support.    Skilled Therapeutic Interventions/Progress Updates:    1:1 Focus on sit to stands and modified sit to stands with use of STEDY.  Inititally pt was max A with +2 for putting down seat flaps for use of STEDY. Performed 5 modified sit to stands from perched seat with min guard with encouragement for anterior pelvic tilt and to come forward for increased bottom clearance. Pt performed sit to stand from w/c towards end of session with min A. Pt transferred to the toilet with BSC over the commode with min A for sit to stand with +2 assisting with clothing management. From BSC min A to come into standing from [facility] in the STEDY and transitioned into recliner. Pt able to control decents into w/c with min guard with more than reasonable amt of time.    Therapy Documentation Precautions:  Precautions Precautions: Fall, Anterior Hip Precaution Comments: no active abduction  Restrictions Weight Bearing Restrictions: No RLE Weight Bearing: Weight bearing as tolerated Other Position/Activity Restrictions: NO active ABDuction General:   Vital Signs: Therapy Vitals Temp: 97.9 F (36.6 C) Temp Source: Oral Pulse Rate: 83 Resp: 16 BP: (!) 148/68 Patient Position (if appropriate): Sitting Oxygen Therapy SpO2: 100 % O2 Device: Not Delivered Pain: soreness and  pain in right shoulder/ UE and in right LE- RN made aware- allowed for rest prn     See Function Navigator for Current Functional Status.   Therapy/Group: Individual Therapy  [name], [name] [name] 02/24/2017, 3:29 PM

## 2017-02-25 ENCOUNTER — Inpatient Hospital Stay (HOSPITAL_COMMUNITY): Payer: Medicare Other | Admitting: Occupational Therapy

## 2017-02-25 ENCOUNTER — Inpatient Hospital Stay (HOSPITAL_COMMUNITY): Payer: Medicare Other | Admitting: Physical Therapy

## 2017-02-25 LAB — CBC WITH DIFFERENTIAL/PLATELET
BASOS ABS: 0 10*3/uL (ref 0.0–0.1)
BASOS PCT: 0 %
EOS PCT: 2 %
Eosinophils Absolute: 0.2 10*3/uL (ref 0.0–0.7)
HEMATOCRIT: 34.7 % — AB (ref 36.0–46.0)
Hemoglobin: 10.8 g/dL — ABNORMAL LOW (ref 12.0–15.0)
Lymphocytes Relative: 14 %
Lymphs Abs: 1.5 10*3/uL (ref 0.7–4.0)
MCH: 27.8 pg (ref 26.0–34.0)
MCHC: 31.1 g/dL (ref 30.0–36.0)
MCV: 89.4 fL (ref 78.0–100.0)
MONO ABS: 1 10*3/uL (ref 0.1–1.0)
Monocytes Relative: 9 %
NEUTROS ABS: 7.8 10*3/uL — AB (ref 1.7–7.7)
Neutrophils Relative %: 75 %
PLATELETS: 232 10*3/uL (ref 150–400)
RBC: 3.88 MIL/uL (ref 3.87–5.11)
RDW: 16.5 % — AB (ref 11.5–15.5)
WBC: 10.5 10*3/uL (ref 4.0–10.5)

## 2017-02-25 LAB — BASIC METABOLIC PANEL
ANION GAP: 12 (ref 5–15)
BUN: 24 mg/dL — ABNORMAL HIGH (ref 6–20)
CALCIUM: 8.8 mg/dL — AB (ref 8.9–10.3)
CO2: 22 mmol/L (ref 22–32)
Chloride: 108 mmol/L (ref 101–111)
Creatinine, Ser: 1.03 mg/dL — ABNORMAL HIGH (ref 0.44–1.00)
GFR calc Af Amer: 51 mL/min — ABNORMAL LOW (ref >60)
GFR, EST NON AFRICAN AMERICAN: 44 mL/min — AB (ref >60)
GLUCOSE: 98 mg/dL (ref 65–99)
Potassium: 4.5 mmol/L (ref 3.5–5.1)
Sodium: 142 mmol/L (ref 135–145)

## 2017-02-25 NOTE — Progress Notes (Signed)
Occupational Therapy Weekly Progress Note  Patient Details  Name: [name] [name] MRN: [id number] Date of Birth: Mar 14, 1917  Beginning of progress report period: February 19, 2017 End of progress report period: February 25, 2017  Today's Date: 02/25/2017 OT Individual Time: 1001-1102 OT Individual Time Calculation (min): 61 min    Patient has met 0 of 4 short term goals.  Pt is making slower progress than originally expected with OT at this time.  She is able to complete UB selfcare at a min assist level overall, but exhibits difficulty with AROM of the right shoulder as needed for washing the left arm.  In addition, she needs min assist for donning bra and pullover shirts.  LB selfcare is currently at a total assist +2 (pt 25%) when standing to wash her peri area or pull garments over her hips.  In standing she demonstrates increased trunk flexion and lateral lean to the left, in order to keep weight off of her RLE, which is painful with weightbearing.  Overall feel that her progress with remain slow based on age, pain, and current diagnosis.  Recommend SNF for follow-up as pt does not have anyone at home that can physically assist him.  Recommend continued CIR level therapy until SNF can be established.    Patient continues to demonstrate the following deficits: muscle weakness and muscle joint tightness and decreased standing balance and decreased balance strategies and therefore will continue to benefit from skilled OT intervention to enhance overall performance with BADL and Reduce care partner burden.  Patient progressing toward long term goals..  Continue plan of care.  OT Short Term Goals Week 2:  OT Short Term Goal 1 (Week 2): Continue working on established LTGs downgraded to max assist overall.  Skilled Therapeutic Interventions/Progress Updates:    Pt completed bathing and dressing sit to stand from the edge of the bed.  Min assist for UB bathing and dressing sitting unsupported.  She  was able to complete fastening bra and donning up over shoulders.  Therapist had to assist with positioning all the way up to the shoulders.  She donned pullover over her arms and head with supervision as well.  She was able to complete sit to stand for LB selfcare with total assist + 2 (pt 30%) using the [name] [name] for support.  Therapist assisted with toilet hygiene and clothing management once standing.  Total assist for donning clothing over feet including pants, TEDs, and gripper socks.  Finished session with sliding board transfer to the wheelchair with mod assist.  Pt left at bedside with call button and phone in reach.  Safety belt in place as well.    Therapy Documentation Precautions:  Precautions Precautions: Fall, Anterior Hip Precaution Comments: no active abduction  Restrictions Weight Bearing Restrictions: No RLE Weight Bearing: Weight bearing as tolerated Other Position/Activity Restrictions: NO active ABDuction  Pain: Pain Assessment Pain Assessment: Faces Pain Type: Surgical pain Pain Location: Leg Pain Orientation: Right Pain Descriptors / Indicators: Discomfort Pain Onset: With Activity Pain Intervention(s): Repositioned ADL: See Function Navigator for Current Functional Status.   Therapy/Group: Individual Therapy  Permelia Bamba OTR/L 02/25/2017, 11:39 AM

## 2017-02-25 NOTE — NC FL2 (Signed)
[place] MEDICAID FL2 LEVEL OF CARE SCREENING TOOL     IDENTIFICATION  Patient Name: [name] [name] Birthdate: [birth year] Sex: female Admission Date (Current Location): 02/18/2017  [place]  [name] [id number] Facility and Address:  [facility], [address], [place], [place] [address]      Provider Number: [id number]  Attending Physician Name and Address:  Marcello FennelPatel, Ankit Anil, MD  Relative Name and Phone Number:  Gregary CromerWendall-son [phone]    Current Level of Care: Other (Comment)(Rehab) Recommended Level of Care: Skilled Nursing Facility Prior Approval Number:    Date Approved/Denied:   PASRR Number: [id number]  Discharge Plan: SNF    Current Diagnoses: Patient Active Problem List   Diagnosis Date Noted  . Stage 3 chronic kidney disease (HCC)   . E-coli UTI   . Primary osteoarthritis of right knee   . Benign essential HTN   . Femoral neck fracture (HCC) 02/18/2017  . H/O: CVA (cerebrovascular accident)   . Post-operative pain   . Acute blood loss anemia   . Tachycardia   . Steroid-induced hyperglycemia   . Thrombocytopenia (HCC)   . Closed right hip fracture, initial encounter (HCC) 02/15/2017  . Atelectasis of left lung 02/15/2017  . Displaced fracture of right femoral neck (HCC) 02/15/2017  . Nausea and vomiting 03/09/2011  . Diabetes mellitus, type II (HCC) 03/09/2011  . Hyperkalemia 03/09/2011  . Acute renal failure (HCC) 03/09/2011  . Hypotension 03/09/2011  . Anemia 03/09/2011  . Coagulopathy (HCC) 03/09/2011  . HTN (hypertension) 02/19/2011  . Dizziness 02/19/2011  . Cerebellar infarct (HCC) 02/19/2011  . Bradycardia 02/19/2011  . H/O: CVA (cardiovascular accident) 02/19/2011  . SPONDYLOSIS 09/18/2009  . SCIATICA 09/18/2009  . SCOLIOSIS-IDIOPATHIC 09/18/2009  . SPONDYLOLITHESIS 09/18/2009    Orientation RESPIRATION BLADDER Height & Weight     Self, Time, Situation, Place  Normal  Incontinent(urgency) Weight: 128 lb 2.2 oz (58.1 kg) Height:  4\' 10"  (147.3 cm)  BEHAVIORAL SYMPTOMS/MOOD NEUROLOGICAL BOWEL NUTRITION STATUS      Continent Diet(regular thin liquid diet)  AMBULATORY STATUS COMMUNICATION OF NEEDS Skin   Extensive Assist Verbally Surgical wounds                       Personal Care Assistance Level of Assistance  Bathing, Dressing Bathing Assistance: Limited assistance   Dressing Assistance: Limited assistance     Functional Limitations Info  Sight Sight Info: Impaired        SPECIAL CARE FACTORS FREQUENCY  PT (By licensed PT), OT (By licensed OT)     PT Frequency: 5x week OT Frequency: 5x week            Contractures Contractures Info: Not present    Additional Factors Info  Code Status, Allergies Code Status Info: Full Code Allergies Info: bee Venom, Pennicillins           Current Medications (02/25/2017):  This is the current hospital active medication list Current Facility-Administered Medications  Medication Dose Route Frequency Provider Last Rate Last Dose  . acetaminophen (TYLENOL) tablet 650 mg  650 mg Oral Q6H PRN Charlton Amorngiulli, Daniel J, PA-C   650 mg at 02/19/17 1222   Or  . acetaminophen (TYLENOL) suppository 650 mg  650 mg Rectal Q6H PRN Angiulli, Mcarthur Rossettianiel J, PA-C      . aspirin chewable tablet 81 mg  81 mg Oral BID WC AngiulliMcarthur Rossetti, Daniel J, PA-C   81 mg at 02/25/17 0755  . atorvastatin (  LIPITOR) tablet 20 mg  20 mg Oral Daily Charlton Amor, PA-C   20 mg at 02/25/17 0755  . bisacodyl (DULCOLAX) suppository 10 mg  10 mg Rectal Daily PRN Charlton Amor, PA-C   10 mg at 02/19/17 1841  . clopidogrel (PLAVIX) tablet 75 mg  75 mg Oral Daily Charlton Amor, PA-C   75 mg at 02/25/17 0755  . diclofenac sodium (VOLTAREN) 1 % transdermal gel 2 g  2 g Topical QID Marcello Fennel, MD   2 g at 02/24/17 2036  . famotidine (PEPCID) tablet 20 mg  20 mg Oral Daily Charlton Amor, PA-C   20 mg at 02/25/17 0755  . feeding  supplement (ENSURE ENLIVE) (ENSURE ENLIVE) liquid 237 mL  237 mL Oral BID BM Angiulli, Mcarthur Rossetti, PA-C   237 mL at 02/24/17 1101  . fesoterodine (TOVIAZ) tablet 4 mg  4 mg Oral Daily Charlton Amor, PA-C   4 mg at 02/25/17 0755  . loratadine (CLARITIN) tablet 10 mg  10 mg Oral Daily Charlton Amor, PA-C   10 mg at 02/25/17 0755  . meclizine (ANTIVERT) tablet 25 mg  25 mg Oral TID PRN Angiulli, Mcarthur Rossetti, PA-C      . nitrofurantoin (macrocrystal-monohydrate) (MACROBID) capsule 100 mg  100 mg Oral Q12H Kirsteins, Victorino Sparrow, MD   100 mg at 02/25/17 0755  . ondansetron (ZOFRAN) tablet 4 mg  4 mg Oral Q6H PRN Angiulli, Mcarthur Rossetti, PA-C       Or  . ondansetron [facility]) injection 4 mg  4 mg Intravenous Q6H PRN Angiulli, Mcarthur Rossetti, PA-C      . QUEtiapine (SEROQUEL) tablet 12.5 mg  12.5 mg Oral QHS Marcello Fennel, MD   12.5 mg at 02/24/17 2033  . senna-docusate (Senokot-S) tablet 2 tablet  2 tablet Oral BID Erick Colace, MD   2 tablet at 02/25/17 0755  . sorbitol 70 % solution 30 mL  30 mL Oral Daily PRN Charlton Amor, PA-C   30 mL at 02/18/17 1834  . traMADol (ULTRAM) tablet 50 mg  50 mg Oral Q6H PRN Charlton Amor, PA-C   50 mg at 02/24/17 1859     Discharge Medications: Please see discharge summary for a list of discharge medications.  Relevant Imaging Results:  Relevant Lab Results:   Additional Information SSN: [id number]  Carlus Stay, Lemar Livings, LCSW

## 2017-02-25 NOTE — Progress Notes (Signed)
Physical Therapy Session Note  Patient Details  Name: [name] [name] MRN: [id number] Date of Birth: 1917/09/16  Today's Date: 02/25/2017 PT Individual Time: 1105-1200 AND 1445-1553 PT Individual Time Calculation (min): 55 min AND 68 min  Short Term Goals: Week 1:  PT Short Term Goal 1 (Week 1): pt will perform bed mobility with mod assis t PT Short Term Goal 2 (Week 1): Pt will initiate gait training  PT Short Term Goal 3 (Week 1): Pt will perform sit<>stand transfer with Mod assist  PT Short Term Goal 4 (Week 1): Pt will perform bed<>WC transfers with mod assist consistently  PT Short Term Goal 5 (Week 1): Pt will propell WC 162f with supervision assist   Skilled Therapeutic Interventions/Progress Updates:   Session 1:  Pt in w/c upon arrival and agreeable to therapy, no c/o pain. Total assist w/c transport to gym for time management. Transferred to edge of mat w/ min assist via slide board transfer and worked on dynamic sitting tasks w/ emphasis on weight shifting to R side to increase tolerance to RLE weight-bearing. Performed multiple sit<>stands to RW from mat, mod assist x2 for safety and pt anxiety. Total assist for foot placement and verbal and manual cues for upright posture and weight shifting. Once in stance, pt able to maintain standing w/ min guard for 10-20 sec at a time. Able to take 1 small step w/ each foot during 1 bout of standing w/ mod assist to facilitate weightshifting. Transferred back to w/c via technique described above and pt self-propelled w/c towards room, 50' w/ supervision using BUEs. Verbal cues for technique. Ended session in w/c eating lunch, call bell within reach and all needs met. QRB donned.   Session 2:  Pt supine upon arrival and agreeable to therapy, no c/o pain. Transferred to EOB and to w/c w/ min-mod assist for LE management and manual facilitation of weight shifting. Pt required increased time w/ all functional mobility this session 2/2 anxiety and  fear of movement. Total assist w/c transport to gym for time management. Worked on sit<>stands in gym w/ mod assist x2 for safety using RW and cues described in previous session. Transferred to/from NuStep w/ mod assist and performed NuStep 10 min @ L1 w/ frequent rest breaks 2/2 fatigue. Emphasis on reciprocal movement pattern in addition to LE strengthening. Min assist for movement pattern to facilitate movement in non-painful range for pt. Returned to room in w/c total assist and transferred to EOB and to supine w/ mod assist. Total assist to boost in bed. Ended session in supine, call bell within reach and all needs met.   Therapy Documentation Precautions:  Precautions Precautions: Fall, Anterior Hip Precaution Comments: no active abduction  Restrictions Weight Bearing Restrictions: No RLE Weight Bearing: Weight bearing as tolerated Other Position/Activity Restrictions: NO active ABDuction Pain: Pain Assessment Pain Assessment: Faces Pain Type: Surgical pain Pain Location: Leg Pain Orientation: Right Pain Descriptors / Indicators: Discomfort Pain Onset: With Activity Pain Intervention(s): Repositioned  See Function Navigator for Current Functional Status.   Therapy/Group: Individual Therapy  Jafet Wissing K Arnette 02/25/2017, 12:07 PM

## 2017-02-25 NOTE — Progress Notes (Signed)
Subjective/Complaints: Pt seen lying in bed this AM.  She slept well overnight, but states she had a difficult morning due having difficulty with blood draw.    ROS: Denies CP, SOB, N/V/D  Objective: Vital Signs: Blood pressure (!) 151/62, pulse 89, temperature 98 F (36.7 C), temperature source Oral, resp. rate 16, height 4\' 10"  (1.473 m), weight 58.1 kg (128 lb 2.2 oz), SpO2 100 %. No results found. No results found for this or any previous visit (from the past 72 hour(s)).   Gen NAD. Vital signs reviewed. HEENT: normocephalic, atraumatic Cardio: RRR and no JVD Resp: CTA B/L and unlabored GI: BS positive and ND Musc/Skel:  Edema and tenderness right thigh  Neuro: Alert  HOH Motor 5/5 in BUE 2+/5 LLE (unchanged) 2-/5 R HF 3-/5 KE and ankle doris/plantar flexion (pain)  Skin: Right hip incision c/d/i, with small area of skin tear at site of adhesive   Assessment/Plan: 1. Functional deficits secondary to RIght hip subcapital fx which require 3+ hours per day of interdisciplinary therapy in a comprehensive inpatient rehab setting. Physiatrist is providing close team supervision and 24 hour management of active medical problems listed below. Physiatrist and rehab team continue to assess barriers to discharge/monitor patient progress toward functional and medical goals. FIM: Function - Bathing Position: Wheelchair/chair at sink Body parts bathed by patient: Right arm, Chest, Abdomen Body parts bathed by helper: Front perineal area, Buttocks, Left arm, Back Bathing not applicable: Left upper leg, Right upper leg, Left lower leg, Right lower leg Assist Level: 2 helpers  Function- Upper Body Dressing/Undressing What is the patient wearing?: Pull over shirt/dress Pull over shirt/dress - Perfomed by patient: Thread/unthread right sleeve, Thread/unthread left sleeve Pull over shirt/dress - Perfomed by helper: Put head through opening, Pull shirt over trunk Function - Lower Body  Dressing/Undressing What is the patient wearing?: Pants Position: Wheelchair/chair at sink Pants- Performed by helper: Thread/unthread right pants leg, Thread/unthread left pants leg Non-skid slipper socks- Performed by helper: Don/doff right sock, Don/doff left sock TED Hose - Performed by helper: Don/doff right TED hose, Don/doff left TED hose Assist for footwear: Dependant Assist for lower body dressing: 2 Helpers  Function - Toileting Toileting steps completed by helper: Adjust clothing prior to toileting, Performs perineal hygiene, Adjust clothing after toileting Assist level: Two helpers  Function - Archivist transfer activity did not occur: Safety/medical concerns Toilet transfer assistive device: Drop arm commode Assist level to toilet: 2 helpers Assist level from toilet: 2 helpers  Function - Chair/bed transfer Chair/bed transfer method: Lateral scoot Chair/bed transfer assist level: Moderate assist (Pt 50 - 74%/lift or lower) Chair/bed transfer assistive device: Mechanical lift Mechanical lift: Stedy Chair/bed transfer details: Manual facilitation for placement, Manual facilitation for weight shifting, Verbal cues for safe use of DME/AE, Verbal cues for precautions/safety  Function - Locomotion: Wheelchair Type: Manual Max wheelchair distance: 20 Assist Level: Touching or steadying assistance (Pt > 75%) Assist Level: Supervision or verbal cues Wheel 150 feet activity did not occur: Safety/medical concerns Turns around,maneuvers to table,bed, and toilet,negotiates 3% grade,maneuvers on rugs and over doorsills: No Function - Locomotion: Ambulation Ambulation activity did not occur: Safety/medical concerns Walk 10 feet activity did not occur: Safety/medical concerns Walk 50 feet with 2 turns activity did not occur: Safety/medical concerns Walk 150 feet activity did not occur: Safety/medical concerns Walk 10 feet on uneven surfaces activity did not occur:  Safety/medical concerns  Function - Comprehension Comprehension: Auditory Comprehension assist level: Understands basic 75 - 89% of  the time/ requires cueing 10 - 24% of the time  Function - Expression Expression: Verbal Expression assist level: Expresses basic 75 - 89% of the time/requires cueing 10 - 24% of the time. Needs helper to occlude trach/needs to repeat words.  Function - Social Interaction Social Interaction assist level: Interacts appropriately 75 - 89% of the time - Needs redirection for appropriate language or to initiate interaction.  Function - Problem Solving Problem solving assist level: Solves basic 50 - 74% of the time/requires cueing 25 - 49% of the time  Function - Memory Memory assist level: Recognizes or recalls 25 - 49% of the time/requires cueing 50 - 75% of the time Patient normally able to recall (first 3 days only): That he or she is in a hospital  Medical Problem List and Plan: 1.Decreased functional mobilitysecondary to displaced right femoral neck fracture after fall. Status post right hip hemiarthroplasty anterior approach 02/15/2017. Weightbearing as tolerated no active abduction  Cont CIR  2. DVT Prophylaxis/Anticoagulation: Aspirin 81 mg twice a day.    Vascular study- Neg     3. Pain Management:Hydrocodone as needed 4. Mood:Provide emotional support 5. Neuropsych: This patientisnot fully capable of making decisions on herown behalf. 6. Skin/Wound Care:Routine skin checks 7. Fluids/Electrolytes/Nutrition:Routine I&O's   Enc po fluids 8.Acute blood loss anemia.    Hgb 10.5 on 1/4   Labs pending 9.History of CVA. Continue Plavix aspirin prior to admission. 10.Diet controlled diabetes mellitus. Patient on no oral diabetic agents prior to admission.CBG (last 3)  No results for input(s): GLUCAP in the last 72 hours.    Controlled 1/6 11.Hypertension. Demadex 10 mg daily. Monitor with increased mobility   ?Trending up 1/10, cont to  monitor 12.CKDstage III    Cr 1.31 on 1/4   Labs pending   Cont to monitor 13.History of dizziness. Continue Antivert as needed 14.History of gout. Zyloprim 300 mg at bedtime. Monitor for any gout flare ups 15.History of overactive bladder. Toviaz 4 mg daily.  16.Hyperlipidemia. Lipitor 17.Constipation. Laxative assistance 18.  Restlessness and confusion- hx of R PICA and R opercular infarcts 2013 with SVD, MID with sundowning    Improved with seroquel qhs, decreased to 12.5 on 1/9   Confounded by UTI 19. Ecoli UTI    Cont Macrodantin 1/5-1/11 20. Right knee OA    Xray reviewed, showing OA    Started on voltaren gel  LOS (Days) 7 A FACE TO FACE EVALUATION WAS PERFORMED  Dontrey Snellgrove Karis JubaAnil Sammie Schermerhorn 02/25/2017, 8:08 AM

## 2017-02-26 ENCOUNTER — Inpatient Hospital Stay (HOSPITAL_COMMUNITY): Payer: Medicare Other | Admitting: Physical Therapy

## 2017-02-26 ENCOUNTER — Inpatient Hospital Stay (HOSPITAL_COMMUNITY): Payer: Medicare Other | Admitting: Occupational Therapy

## 2017-02-26 DIAGNOSIS — E119 Type 2 diabetes mellitus without complications: Secondary | ICD-10-CM

## 2017-02-26 NOTE — Progress Notes (Signed)
Occupational Therapy Session Note  Patient Details  Name: [name] [name] MRN: [id number] Date of Birth: [birth year]  Today's Date: 02/26/2017 OT Individual Time: 1001-1100 OT Individual Time Calculation (min): 59 min    Short Term Goals: Week 2:  OT Short Term Goal 1 (Week 2): Continue working on established LTGs downgraded to max assist overall.  Skilled Therapeutic Interventions/Progress Updates:   fPt completed grooming and bathing and dressing tasks sit to stand at the sink.  She was able to complete sit to stand intervals for LB selfcare with total assist +2 (pt 30%) using the sink for UE support.  Once standing she could maintain her static standing balance with mod assist while therapist provided total assist for peri washing and pulling garments over hips.  She completed UB bathing with supervision and min assist for donning pullover shirt.  Max assist for donning all clothing over her hips.  Finished session with pt in wheelchair with call button and phone in reach and safety belt in place.     Therapy Documentation Precautions:  Precautions Precautions: Fall Precaution Comments: no active abduction  Restrictions Weight Bearing Restrictions: No RLE Weight Bearing: Weight bearing as tolerated Other Position/Activity Restrictions: NO active ABDuction  Pain: Pain Assessment Pain Assessment: Faces Faces Pain Scale: Hurts a little bit Pain Type: Surgical pain Pain Location: Hip Pain Descriptors / Indicators: Discomfort Pain Onset: With Activity Pain Intervention(s): Emotional support;Repositioned ADL: See Function Navigator for Current Functional Status.   Therapy/Group: Individual Therapy  Isabellarose Kope OTR/L 02/26/2017, 11:03 AM

## 2017-02-26 NOTE — Progress Notes (Signed)
Subjective/Complaints: Patient seen lying in bed this morning. She states she slept well overnight. She states she does not feel like getting up this morning. She notes her right shoulder remained sore, but is feeling better.  ROS: Denies CP, SOB, N/V/D  Objective: Vital Signs: Blood pressure (!) 136/56, pulse 62, temperature (!) 97.5 F (36.4 C), temperature source Oral, resp. rate 16, height 4' 10" (1.473 m), weight 58.1 kg (128 lb 2.2 oz), SpO2 98 %. No results found. Results for orders placed or performed during the hospital encounter of 02/18/17 (from the past 72 hour(s))  Basic metabolic panel     Status: Abnormal   Collection Time: 02/25/17  7:14 AM  Result Value Ref Range   Sodium 142 135 - 145 mmol/L   Potassium 4.5 3.5 - 5.1 mmol/L   Chloride 108 101 - 111 mmol/L   CO2 22 22 - 32 mmol/L   Glucose, Bld 98 65 - 99 mg/dL   BUN 24 (H) 6 - 20 mg/dL   Creatinine, Ser 1.03 (H) 0.44 - 1.00 mg/dL   Calcium 8.8 (L) 8.9 - 10.3 mg/dL   GFR calc non Af Amer 44 (L) >60 mL/min   GFR calc Af Amer 51 (L) >60 mL/min    Comment: (NOTE) The eGFR has been calculated using the CKD EPI equation. This calculation has not been validated in all clinical situations. eGFR's persistently <60 mL/min signify possible Chronic Kidney Disease.    Anion gap 12 5 - 15  CBC with Differential/Platelet     Status: Abnormal   Collection Time: 02/25/17  7:14 AM  Result Value Ref Range   WBC 10.5 4.0 - 10.5 K/uL   RBC 3.88 3.87 - 5.11 MIL/uL   Hemoglobin 10.8 (L) 12.0 - 15.0 g/dL   HCT 34.7 (L) 36.0 - 46.0 %   MCV 89.4 78.0 - 100.0 fL   MCH 27.8 26.0 - 34.0 pg   MCHC 31.1 30.0 - 36.0 g/dL   RDW 16.5 (H) 11.5 - 15.5 %   Platelets 232 150 - 400 K/uL   Neutrophils Relative % 75 %   Neutro Abs 7.8 (H) 1.7 - 7.7 K/uL   Lymphocytes Relative 14 %   Lymphs Abs 1.5 0.7 - 4.0 K/uL   Monocytes Relative 9 %   Monocytes Absolute 1.0 0.1 - 1.0 K/uL   Eosinophils Relative 2 %   Eosinophils Absolute 0.2 0.0 -  0.7 K/uL   Basophils Relative 0 %   Basophils Absolute 0.0 0.0 - 0.1 K/uL     Gen NAD. Vital signs reviewed. HEENT: normocephalic, atraumatic Cardio: RRR and no JVD Resp: CTA B/L and unlaboured GI: BS positive and ND Musc/Skel:  Edema and tenderness right thigh  Neuro: Alert  HOH Motor 5/5 in BUE 2+/5 LLE (stable) 2-/5 R HF 3-/5 KE and ankle doris/plantar flexion (some pain inhibition)  Skin: Right hip incision c/d/i   Assessment/Plan: 1. Functional deficits secondary to RIght hip subcapital fx which require 3+ hours per day of interdisciplinary therapy in a comprehensive inpatient rehab setting. Physiatrist is providing close team supervision and 24 hour management of active medical problems listed below. Physiatrist and rehab team continue to assess barriers to discharge/monitor patient progress toward functional and medical goals. FIM: Function - Bathing Position: Sitting EOB Body parts bathed by patient: Right arm, Chest, Abdomen, Right upper leg, Left upper leg Body parts bathed by helper: Left arm, Right lower leg, Left lower leg, Back, Buttocks, Front perineal area Bathing not applicable: Left upper  leg, Right upper leg, Left lower leg, Right lower leg Assist Level: 2 helpers  Function- Upper Body Dressing/Undressing What is the patient wearing?: Pull over shirt/dress, Bra Bra - Perfomed by patient: Thread/unthread right bra strap, Thread/unthread left bra strap, Hook/unhook bra (pull down sports bra) Pull over shirt/dress - Perfomed by patient: Pull shirt over trunk, Thread/unthread left sleeve, Thread/unthread right sleeve, Put head through opening Pull over shirt/dress - Perfomed by helper: Put head through opening, Pull shirt over trunk Function - Lower Body Dressing/Undressing What is the patient wearing?: Pants, Ted Hose, Non-skid slipper socks Position: Sitting EOB Pants- Performed by helper: Thread/unthread right pants leg, Thread/unthread left pants leg, Pull  pants up/down Non-skid slipper socks- Performed by helper: Don/doff right sock, Don/doff left sock TED Hose - Performed by helper: Don/doff right TED hose, Don/doff left TED hose Assist for footwear: Dependant Assist for lower body dressing: 2 Helpers  Function - Toileting Toileting steps completed by helper: Adjust clothing prior to toileting, Performs perineal hygiene, Adjust clothing after toileting Assist level: Two helpers  Function - Air cabin crew transfer activity did not occur: Safety/medical concerns Toilet transfer assistive device: Drop arm commode Assist level to toilet: 2 helpers Assist level from toilet: 2 helpers  Function - Chair/bed transfer Chair/bed transfer method: Lateral scoot Chair/bed transfer assist level: Touching or steadying assistance (Pt > 75%) Chair/bed transfer assistive device: Sliding board Mechanical lift: Stedy Chair/bed transfer details: Manual facilitation for placement, Manual facilitation for weight shifting, Verbal cues for safe use of DME/AE, Verbal cues for precautions/safety  Function - Locomotion: Wheelchair Will patient use wheelchair at discharge?: Yes Type: Manual Max wheelchair distance: 50' Assist Level: Supervision or verbal cues Assist Level: Supervision or verbal cues Wheel 150 feet activity did not occur: Safety/medical concerns Turns around,maneuvers to table,bed, and toilet,negotiates 3% grade,maneuvers on rugs and over doorsills: No Function - Locomotion: Ambulation Ambulation activity did not occur: Safety/medical concerns Walk 10 feet activity did not occur: Safety/medical concerns Walk 50 feet with 2 turns activity did not occur: Safety/medical concerns Walk 150 feet activity did not occur: Safety/medical concerns Walk 10 feet on uneven surfaces activity did not occur: Safety/medical concerns  Function - Comprehension Comprehension: Auditory Comprehension assist level: Understands basic 75 - 89% of the time/  requires cueing 10 - 24% of the time  Function - Expression Expression: Verbal Expression assist level: Expresses basic 75 - 89% of the time/requires cueing 10 - 24% of the time. Needs helper to occlude trach/needs to repeat words.  Function - Social Interaction Social Interaction assist level: Interacts appropriately 75 - 89% of the time - Needs redirection for appropriate language or to initiate interaction.  Function - Problem Solving Problem solving assist level: Solves basic 50 - 74% of the time/requires cueing 25 - 49% of the time  Function - Memory Memory assist level: Recognizes or recalls 25 - 49% of the time/requires cueing 50 - 75% of the time Patient normally able to recall (first 3 days only): That he or she is in a hospital  Medical Problem List and Plan: 1.Decreased functional mobilitysecondary to displaced right femoral neck fracture after fall. Status post right hip hemiarthroplasty anterior approach 02/15/2017. Weightbearing as tolerated no active abduction  Cont CIR  2. DVT Prophylaxis/Anticoagulation: Aspirin 81 mg twice a day.    Vascular study- Neg     3. Pain Management:Hydrocodone as needed   Added Voltaren gel for right shoulder as well 4. Mood:Provide emotional support 5. Neuropsych: This patientisnot fully capable of  making decisions on herown behalf. 6. Skin/Wound Care:Routine skin checks 7. Fluids/Electrolytes/Nutrition:Routine I&O's 8.Acute blood loss anemia.    Hgb 10.8 on 1/10 9.History of CVA. Continue Plavix aspirin prior to admission. 10.Diet controlled diabetes mellitus. Patient on no oral diabetic agents prior to admission. CBG (last 3)  No results for input(s): GLUCAP in the last 72 hours.    Controlled 1/10 11.Hypertension. Demadex 10 mg daily. Monitor with increased mobility   ? Improving control on 1/10 12.CKDstage III    Cr 1.03 on 1/10   Cont to monitor 13.History of dizziness. Continue Antivert as needed 14.History  of gout. Zyloprim 300 mg at bedtime. Monitor for any gout flare ups 15.History of overactive bladder. Toviaz 4 mg daily.  16.Hyperlipidemia. Lipitor 17.Constipation. Laxative assistance 18.  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 Ecoli UTI    Cont Macrodantin 1/5-1/11 20. Right knee OA    Xray reviewed, showing OA    Started on voltaren gel  LOS (Days) 8 A FACE TO FACE EVALUATION WAS PERFORMED   Lorie Phenix 02/26/2017, 8:10 AM

## 2017-02-26 NOTE — Progress Notes (Signed)
Physical Therapy Weekly Progress Note  Patient Details  Name: [name] [name] MRN: [id number] Date of Birth: 09/08/1917  Beginning of progress report period: February 19, 2017 End of progress report period: February 26, 2017  Today's Date: 02/26/2017 PT Individual Time: 0915-1000 AND 1400-1500 AND 4081-4481 PT Individual Time Calculation (min): 45 min AND 60 min AND 25 min   Patient has met 4 of 5 short term goals. Pt is consistently performing functional mobility at a mod assist level including bed mobility, slide board transfers, and sit<>stands. However, pt does require occasional +2 assist for safety and to decrease pt's anxiety and fear of movement. She additionally moves at an extremely slow pace due to pain inhibition and fear. Gait training has been initiated, but remains non-functional due to motivation required to complete ~10 steps and behavior. Locomotion is primarily by means of w/c as she is able to self-propel w/c in 50' bouts w/ supervision using BUEs, however she is limited in distance by fatigue.   Patient continues to demonstrate the following deficits muscle weakness and muscle joint tightness, decreased cardiorespiratoy endurance and decreased sitting balance, decreased standing balance, decreased postural control, decreased balance strategies and difficulty maintaining precautions and therefore will continue to benefit from skilled PT intervention to increase functional independence with mobility.   Patient progressing toward long term goals..  Continue plan of care. Discontinued gait goal, anticipate pt will remain non-functional w/ ambulation at discharge. Bed mobility goal downgraded to min assist 2/2 pt fear and anxiety w/ active movement of RLE.   PT Short Term Goals Week 1:  PT Short Term Goal 1 (Week 1): pt will perform bed mobility with mod assis t PT Short Term Goal 1 - Progress (Week 1): Met PT Short Term Goal 2 (Week 1): Pt will initiate gait training  PT Short Term  Goal 2 - Progress (Week 1): Met PT Short Term Goal 3 (Week 1): Pt will perform sit<>stand transfer with Mod assist  PT Short Term Goal 3 - Progress (Week 1): Met PT Short Term Goal 4 (Week 1): Pt will perform bed<>WC transfers with mod assist consistently  PT Short Term Goal 4 - Progress (Week 1): Met PT Short Term Goal 5 (Week 1): Pt will propell WC 167f with supervision assist  PT Short Term Goal 5 - Progress (Week 1): Progressing toward goal Week 2:  PT Short Term Goal 1 (Week 2): =LTGs due to ELOS  Skilled Therapeutic Interventions/Progress Updates:   Session 1:  Pt sitting upright in bed upon arrival eating breakfast, agreeable to therapy but wanting to finish eating. Agreeable to sit at side of bed to finish breakfast. Provided w/ max assist to don LE garments in supine and transferred to EOB w/ mod assist. She maintained static sitting balance w/o back support and close supervision while finishing breakfast, no increase in fatigue w/ sitting upright. Transferred to w/c via slide board transfer w/ mod assist and increased time 2/2 pain and fear. Pt reported needing to toilet. Transferred to bedside commode via stedy w/ mod assist to stand. Manual and verbal cues for technique to stand. Total assist for LE garment management and pericare. Returned to w/c via stedy. Ended session sitting up in w/c and in care of OT.  Session 2: Pt in w/c upon arrival and agreeable to therapy, c/o R knee pain throughout session w/ any active or passive movement, unable to quantify and resolves w/ rest. Pt self propelled w/c to gym using BUEs in 50' bouts w/  supervision, required brief rest in between bouts 2/2 UE soreness. Transferred to edge of mat and to supine on mat w/ mod assist and increased time 2/2 pain and fear of movement. Tactile and verbal cues for technique. Educated pt on importance of increasing tolerance to active movement of RLE within surgical precautions to decrease caregiver burden. Worked on  gentle passive ROM of R hip and knee in supine to promote decreased anxiety w/ movement. Returned to edge of mat w/ mod assist and practiced sit<>stands w/ walker from average height surface, min assist to boost however mod assist for cues and facilitation of anterior weight shifting. Pt ambulated 7' using RW w/ max assist x2, manual cues for lateral weight-shifting, posture, and step placement. Total assist to turn and sit on mat 2/2 anxious behavior at end of gait bout. Transferred back to w/c via slide board w/ mod assist and total assist w/c transport back to room 2/2 time management. Ended session in w/c, call bell within reach and all needs met. QRB engaged.   Session 3:  Pt in w/c and agreeable to therapy, c/o R knee soreness. Continued w/ gentle R knee and hip passive ROM for relief of soreness. Educated pt and son in room regarding strategies to relieve pain and soreness. Total assist to position in w/c for pain relief and tolerance to upright sitting, while educating both on purposes of positioning. Both appreciative of education. Ended session in w/c and in care of family. Ice packs applied to both R knee and hip.     Therapy Documentation Precautions:  Precautions Precautions: Fall, Anterior Hip Precaution Comments: no active abduction  Restrictions Weight Bearing Restrictions: Yes RLE Weight Bearing: Weight bearing as tolerated Other Position/Activity Restrictions: NO active ABDuction Vital Signs: Therapy Vitals Temp: (!) 97.5 F (36.4 C) Temp Source: Oral Pulse Rate: 62 Resp: 16 BP: (!) 136/56 Patient Position (if appropriate): Lying Oxygen Therapy SpO2: 98 % O2 Device: Not Delivered  See Function Navigator for Current Functional Status.  Therapy/Group: Individual Therapy  Prynce Jacober K Arnette 02/26/2017, 10:43 AM

## 2017-02-27 ENCOUNTER — Inpatient Hospital Stay (HOSPITAL_COMMUNITY): Payer: Medicare Other | Admitting: Physical Therapy

## 2017-02-27 ENCOUNTER — Inpatient Hospital Stay (HOSPITAL_COMMUNITY): Payer: Medicare Other

## 2017-02-27 DIAGNOSIS — R0989 Other specified symptoms and signs involving the circulatory and respiratory systems: Secondary | ICD-10-CM

## 2017-02-27 DIAGNOSIS — H9193 Unspecified hearing loss, bilateral: Secondary | ICD-10-CM

## 2017-02-27 NOTE — Progress Notes (Signed)
Subjective/Complaints: Patient seen lying in bed this morning. She states she slept well overnight.  ROS: Denies CP, SOB, N/V/D  Objective: Vital Signs: Blood pressure (!) 161/57, pulse 89, temperature 98.1 F (36.7 C), temperature source Oral, resp. rate 16, height 4' 10" (1.473 m), weight 58.1 kg (128 lb 2.2 oz), SpO2 95 %. No results found. Results for orders placed or performed during the hospital encounter of 02/18/17 (from the past 72 hour(s))  Basic metabolic panel     Status: Abnormal   Collection Time: 02/25/17  7:14 AM  Result Value Ref Range   Sodium 142 135 - 145 mmol/L   Potassium 4.5 3.5 - 5.1 mmol/L   Chloride 108 101 - 111 mmol/L   CO2 22 22 - 32 mmol/L   Glucose, Bld 98 65 - 99 mg/dL   BUN 24 (H) 6 - 20 mg/dL   Creatinine, Ser 1.03 (H) 0.44 - 1.00 mg/dL   Calcium 8.8 (L) 8.9 - 10.3 mg/dL   GFR calc non Af Amer 44 (L) >60 mL/min   GFR calc Af Amer 51 (L) >60 mL/min    Comment: (NOTE) The eGFR has been calculated using the CKD EPI equation. This calculation has not been validated in all clinical situations. eGFR's persistently <60 mL/min signify possible Chronic Kidney Disease.    Anion gap 12 5 - 15  CBC with Differential/Platelet     Status: Abnormal   Collection Time: 02/25/17  7:14 AM  Result Value Ref Range   WBC 10.5 4.0 - 10.5 K/uL   RBC 3.88 3.87 - 5.11 MIL/uL   Hemoglobin 10.8 (L) 12.0 - 15.0 g/dL   HCT 34.7 (L) 36.0 - 46.0 %   MCV 89.4 78.0 - 100.0 fL   MCH 27.8 26.0 - 34.0 pg   MCHC 31.1 30.0 - 36.0 g/dL   RDW 16.5 (H) 11.5 - 15.5 %   Platelets 232 150 - 400 K/uL   Neutrophils Relative % 75 %   Neutro Abs 7.8 (H) 1.7 - 7.7 K/uL   Lymphocytes Relative 14 %   Lymphs Abs 1.5 0.7 - 4.0 K/uL   Monocytes Relative 9 %   Monocytes Absolute 1.0 0.1 - 1.0 K/uL   Eosinophils Relative 2 %   Eosinophils Absolute 0.2 0.0 - 0.7 K/uL   Basophils Relative 0 %   Basophils Absolute 0.0 0.0 - 0.1 K/uL     Gen NAD. Vital signs reviewed. HEENT:  normocephalic, atraumatic Cardio: RRR and no JVD Resp: CTA B/L and unlabored GI: BS positive and ND Musc/Skel:  Edema and tenderness right thigh  Neuro: Alert  HOH Motor 5/5 in BUE 2+/5 LLE (stable) 3-/5 R HF 3-/5 KE and ankle doris/plantar flexion (some pain inhibition)  Skin: Right hip incision c/d/i   Assessment/Plan: 1. Functional deficits secondary to RIght hip subcapital fx which require 3+ hours per day of interdisciplinary therapy in a comprehensive inpatient rehab setting. Physiatrist is providing close team supervision and 24 hour management of active medical problems listed below. Physiatrist and rehab team continue to assess barriers to discharge/monitor patient progress toward functional and medical goals. FIM: Function - Bathing Position: Wheelchair/chair at sink Body parts bathed by patient: Left arm, Chest, Right arm, Abdomen, Right upper leg, Left upper leg Body parts bathed by helper: Right lower leg, Left lower leg, Back, Buttocks, Front perineal area Bathing not applicable: Left upper leg, Right upper leg, Left lower leg, Right lower leg Assist Level: 2 helpers  Function- Upper Body Dressing/Undressing What is the  patient wearing?: Pull over shirt/dress Bra - Perfomed by patient: Thread/unthread right bra strap, Thread/unthread left bra strap, Hook/unhook bra (pull down sports bra) Pull over shirt/dress - Perfomed by patient: Pull shirt over trunk, Thread/unthread right sleeve, Put head through opening Pull over shirt/dress - Perfomed by helper: Thread/unthread left sleeve Function - Lower Body Dressing/Undressing What is the patient wearing?: Pants, Ted Hose, Non-skid slipper socks Position: Wheelchair/chair at sink Pants- Performed by helper: Thread/unthread right pants leg, Thread/unthread left pants leg, Pull pants up/down Non-skid slipper socks- Performed by helper: Don/doff right sock, Don/doff left sock TED Hose - Performed by helper: Don/doff right TED  hose, Don/doff left TED hose Assist for footwear: Dependant Assist for lower body dressing: 2 Helpers  Function - Toileting Toileting steps completed by helper: Adjust clothing prior to toileting, Performs perineal hygiene, Adjust clothing after toileting Assist level: Two helpers  Function - Air cabin crew transfer activity did not occur: Safety/medical concerns Toilet transfer assistive device: Drop arm commode Assist level to toilet: 2 helpers Assist level from toilet: 2 helpers  Function - Chair/bed transfer Chair/bed transfer method: Lateral scoot Chair/bed transfer assist level: Touching or steadying assistance (Pt > 75%) Chair/bed transfer assistive device: Sliding board Mechanical lift: Stedy Chair/bed transfer details: Manual facilitation for placement, Manual facilitation for weight shifting, Verbal cues for safe use of DME/AE, Verbal cues for precautions/safety  Function - Locomotion: Wheelchair Will patient use wheelchair at discharge?: Yes Type: Manual Max wheelchair distance: 50' Assist Level: Supervision or verbal cues Assist Level: Supervision or verbal cues Wheel 150 feet activity did not occur: Safety/medical concerns Turns around,maneuvers to table,bed, and toilet,negotiates 3% grade,maneuvers on rugs and over doorsills: No Function - Locomotion: Ambulation Ambulation activity did not occur: Safety/medical concerns Assistive device: Walker-rolling Max distance: 7' Assist level: 2 helpers Walk 10 feet activity did not occur: Safety/medical concerns Walk 50 feet with 2 turns activity did not occur: Safety/medical concerns Walk 150 feet activity did not occur: Safety/medical concerns Walk 10 feet on uneven surfaces activity did not occur: Safety/medical concerns  Function - Comprehension Comprehension: Auditory Comprehension assist level: Understands basic 75 - 89% of the time/ requires cueing 10 - 24% of the time  Function - Expression Expression:  Verbal Expression assist level: Expresses basic 75 - 89% of the time/requires cueing 10 - 24% of the time. Needs helper to occlude trach/needs to repeat words.  Function - Social Interaction Social Interaction assist level: Interacts appropriately 75 - 89% of the time - Needs redirection for appropriate language or to initiate interaction.  Function - Problem Solving Problem solving assist level: Solves basic 50 - 74% of the time/requires cueing 25 - 49% of the time  Function - Memory Memory assist level: Recognizes or recalls 25 - 49% of the time/requires cueing 50 - 75% of the time Patient normally able to recall (first 3 days only): That he or she is in a hospital  Medical Problem List and Plan: 1.Decreased functional mobilitysecondary to displaced right femoral neck fracture after fall. Status post right hip hemiarthroplasty anterior approach 02/15/2017. Weightbearing as tolerated no active abduction  Cont CIR  2. DVT Prophylaxis/Anticoagulation: Aspirin 81 mg twice a day.    Vascular study- Neg     3. Pain Management:Hydrocodone as needed   Added Voltaren gel for right shoulder as well, with benefit  4. Mood:Provide emotional support 5. Neuropsych: This patientisnot fully capable of making decisions on herown behalf. 6. Skin/Wound Care:Routine skin checks 7. Fluids/Electrolytes/Nutrition:Routine I&O's 8.Acute blood loss anemia.  Hgb 10.8 on 1/10 9.History of CVA. Continue Plavix aspirin prior to admission. 10.Diet controlled diabetes mellitus. Patient on no oral diabetic agents prior to admission. CBG (last 3)  No results for input(s): GLUCAP in the last 72 hours.    Controlled 1/10 11.Hypertension. Demadex 10 mg daily. Monitor with increased mobility   Slightly labile, however overall controlled on 1/12 12.CKDstage III    Cr 1.03 on 1/10   Cont to monitor 13.History of dizziness. Continue Antivert as needed 14.History of gout. Zyloprim 300 mg at  bedtime. Monitor for any gout flare ups 15.History of overactive bladder. Toviaz 4 mg daily.  16.Hyperlipidemia. Lipitor 17.Constipation. Laxative assistance 18.  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 Ecoli UTI    Macrodantin completed 1/5-1/11 20. Right knee OA    Xray reviewed, showing OA    Started on voltaren gel  LOS (Days) 9 A FACE TO FACE EVALUATION WAS PERFORMED   Lorie Phenix 02/27/2017, 7:48 AM

## 2017-02-27 NOTE — Progress Notes (Signed)
Physical Therapy Session Note  Patient Details  Name: [name] [name] MRN: [id number] Date of Birth: [birth year]  Today's Date: 02/27/2017 PT Individual Time: 1502-1530 PT Individual Time Calculation (min): 28 min   Short Term Goals: Week 2:  PT Short Term Goal 1 (Week 2): =LTGs due to ELOS  Skilled Therapeutic Interventions/Progress Updates:    Pt seated in w/c upon PT arrival, agreeable to therapy tx and reports pain in R hip/knee but does not rate. Pt propelled w/c from room>gym x 100 ft with supervision using B UEs and increased time to complete, requiring encouragement to continue. Pt performed sit<>stand in parallel bars with mod assist, worked on standing balance with min-mod assist while taking left UE off parallel bars, verbal and tactile cues for hip extension. Pt able to maintain standing for about 5 minutes while engaging in conversation. Pt transported back to room in w/c, left seated in w/c with ice applied to R LE for pain relief, needs in reach.   Therapy Documentation Precautions:  Precautions Precautions: Fall Precaution Comments: no active abduction  Restrictions Weight Bearing Restrictions: Yes RLE Weight Bearing: Weight bearing as tolerated Other Position/Activity Restrictions: NO active ABDuction   See Function Navigator for Current Functional Status.   Therapy/Group: Individual Therapy  Cresenciano GenreEmily van Schagen, PT, DPT 02/27/2017, 4:06 PM

## 2017-02-27 NOTE — Progress Notes (Signed)
Physical Therapy Session Note  Patient Details  Name: [name] [name] MRN: [id number] Date of Birth: 07/10/1917  Today's Date: 02/27/2017 PT Missed Time: 75 Minutes Missed Time Reason: Patient unwilling to participate  Pt in w/c upon arrival and starting to eat lunch, requesting to eat prior to therapy. Returned 15, and 45 minutes later, pt was still eating lunch and visitors were in room. Refusing to participate in therapy. Missed 75 minutes of skilled PT due to refusal. Discussed pt eating lunch when lunch arrived at noon, pt stated "it takes me a long time to eat". Pt noted to still be eating lunch when walking past room at 3pm.   [name] [name] 02/27/2017, 3:51 PM

## 2017-02-28 ENCOUNTER — Inpatient Hospital Stay (HOSPITAL_COMMUNITY): Payer: Medicare Other

## 2017-02-28 NOTE — Progress Notes (Signed)
Subjective/Complaints: Pt seen laying in bed this AM.  She states she slept well overnight and feels "fair" this AM.    ROS: Denies CP, SOB, N/V/D  Objective: Vital Signs: Blood pressure (!) 142/56, pulse 90, temperature 98.3 F (36.8 C), temperature source Oral, resp. rate 18, height 4\' 10"  (1.473 m), weight 58.1 kg (128 lb 2.2 oz), SpO2 97 %. No results found. No results found for this or any previous visit (from the past 72 hour(s)).   Gen NAD. Vital signs reviewed. HEENT: normocephalic, atraumatic Cardio: RRR and no JVD Resp: CTA B/L and unlabored GI: BS positive and ND Musc/Skel:  Edema and tenderness right thigh  Neuro: Alert  HOH Motor 5/5 in BUE 2+/5 LLE (unchanged) 3-/5 R HF 3-/5 KE and ankle doris/plantar flexion (some pain inhibition)  Skin: Right hip incision c/d/i   Assessment/Plan: 1. Functional deficits secondary to RIght hip subcapital fx which require 3+ hours per day of interdisciplinary therapy in a comprehensive inpatient rehab setting. Physiatrist is providing close team supervision and 24 hour management of active medical problems listed below. Physiatrist and rehab team continue to assess barriers to discharge/monitor patient progress toward functional and medical goals. FIM: Function - Bathing Position: Wheelchair/chair at sink Body parts bathed by patient: Left arm, Chest, Right arm, Abdomen, Right upper leg, Left upper leg Body parts bathed by helper: Right lower leg, Left lower leg, Back, Buttocks, Front perineal area Bathing not applicable: Left upper leg, Right upper leg, Left lower leg, Right lower leg Assist Level: 2 helpers  Function- Upper Body Dressing/Undressing What is the patient wearing?: Pull over shirt/dress Bra - Perfomed by patient: Thread/unthread right bra strap, Thread/unthread left bra strap, Hook/unhook bra (pull down sports bra) Pull over shirt/dress - Perfomed by patient: Pull shirt over trunk, Thread/unthread right sleeve,  Put head through opening Pull over shirt/dress - Perfomed by helper: Thread/unthread left sleeve Function - Lower Body Dressing/Undressing What is the patient wearing?: Pants, Ted Hose, Non-skid slipper socks Position: Wheelchair/chair at sink Pants- Performed by helper: Thread/unthread right pants leg, Thread/unthread left pants leg, Pull pants up/down Non-skid slipper socks- Performed by helper: Don/doff right sock, Don/doff left sock TED Hose - Performed by helper: Don/doff right TED hose, Don/doff left TED hose Assist for footwear: Dependant Assist for lower body dressing: 2 Helpers  Function - Toileting Toileting steps completed by helper: Adjust clothing prior to toileting, Performs perineal hygiene, Adjust clothing after toileting Assist level: Two helpers  Function - ArchivistToilet Transfers Toilet transfer activity did not occur: Safety/medical concerns Toilet transfer assistive device: Systems developerMechanical lift Mechanical lift: Stedy Assist level to toilet: 2 helpers Assist level from toilet: 2 helpers  Function - Chair/bed transfer Chair/bed transfer method: Lateral scoot Chair/bed transfer assist level: Touching or steadying assistance (Pt > 75%) Chair/bed transfer assistive device: Sliding board Mechanical lift: Stedy Chair/bed transfer details: Manual facilitation for placement, Manual facilitation for weight shifting, Verbal cues for safe use of DME/AE, Verbal cues for precautions/safety  Function - Locomotion: Wheelchair Will patient use wheelchair at discharge?: Yes Type: Manual Max wheelchair distance: 100 ft Assist Level: Supervision or verbal cues Assist Level: Supervision or verbal cues Wheel 150 feet activity did not occur: Safety/medical concerns Turns around,maneuvers to table,bed, and toilet,negotiates 3% grade,maneuvers on rugs and over doorsills: No Function - Locomotion: Ambulation Ambulation activity did not occur: Safety/medical concerns Assistive device:  Walker-rolling Max distance: 7' Assist level: 2 helpers Walk 10 feet activity did not occur: Safety/medical concerns Walk 50 feet with 2 turns activity  did not occur: Safety/medical concerns Walk 150 feet activity did not occur: Safety/medical concerns Walk 10 feet on uneven surfaces activity did not occur: Safety/medical concerns  Function - Comprehension Comprehension: Auditory Comprehension assist level: Understands basic 75 - 89% of the time/ requires cueing 10 - 24% of the time  Function - Expression Expression: Verbal Expression assist level: Expresses basic 75 - 89% of the time/requires cueing 10 - 24% of the time. Needs helper to occlude trach/needs to repeat words.  Function - Social Interaction Social Interaction assist level: Interacts appropriately 75 - 89% of the time - Needs redirection for appropriate language or to initiate interaction.  Function - Problem Solving Problem solving assist level: Solves basic 75 - 89% of the time/requires cueing 10 - 24% of the time  Function - Memory Memory assist level: Recognizes or recalls 25 - 49% of the time/requires cueing 50 - 75% of the time Patient normally able to recall (first 3 days only): That he or she is in a hospital  Medical Problem List and Plan: 1.Decreased functional mobilitysecondary to displaced right femoral neck fracture after fall. Status post right hip hemiarthroplasty anterior approach 02/15/2017. Weightbearing as tolerated no active abduction  Cont CIR  2. DVT Prophylaxis/Anticoagulation: Aspirin 81 mg twice a day.    Vascular study- Neg     3. Pain Management:Hydrocodone as needed   Added Voltaren gel for right shoulder as well, with benefit    Controlled on 1/13 4. Mood:Provide emotional support 5. Neuropsych: This patientisnot fully capable of making decisions on herown behalf. 6. Skin/Wound Care:Routine skin checks 7. Fluids/Electrolytes/Nutrition:Routine I&O's 8.Acute blood loss anemia.     Hgb 10.8 on 1/10 9.History of CVA. Continue Plavix aspirin prior to admission. 10.Diet controlled diabetes mellitus. Patient on no oral diabetic agents prior to admission. CBG (last 3)  No results for input(s): GLUCAP in the last 72 hours.    Controlled 1/10 11.Hypertension. Demadex 10 mg daily. Monitor with increased mobility   Relatively controlled on 1/13 12.CKDstage III    Cr 1.03 on 1/10   Cont to monitor 13.History of dizziness. Continue Antivert as needed 14.History of gout. Zyloprim 300 mg at bedtime. Monitor for any gout flare ups 15.History of overactive bladder. Toviaz 4 mg daily.  16.Hyperlipidemia. Lipitor 17.Constipation. Laxative assistance 18.  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 Ecoli UTI    Macrodantin completed 1/5-1/11 20. Right knee OA    Xray reviewed, showing OA    Cont voltaren gel  LOS (Days) 10 A FACE TO FACE EVALUATION WAS PERFORMED  Ankit Karis Juba 02/28/2017, 8:15 AM

## 2017-02-28 NOTE — Progress Notes (Signed)
Physical Therapy Session Note  Patient Details  Name: [name] [name] MRN: [id number] Date of Birth: [birth year]  Today's Date: 02/28/2017 PT Individual Time: [id number] PT Individual Time Calculation (min): 58 min   Short Term Goals: Week 2:  PT Short Term Goal 1 (Week 2): =LTGs due to ELOS  Skilled Therapeutic Interventions/Progress Updates:    Pt seated in w/c upon PT arrival, agreeable to therapy tx and denies pain at rest. Pt reports needing to use the bathroom. Pt transferred from sitting>standing within stedy with min assist and transferred from w/c>toilet total assist using stedy. Pt performed multiple sit<>stands from stedy with supervision and verbal cues for technique while therapist assisted with clothing management. Pt standing working on standing balance within stedy with single UE support while using other UE to clean peri area. Pt transferred from toilet>sink and performed sit<>stand to stand and wash hands at the sink. Pt transferred back to w/c with stedy total assist. Pt transported to gym in w/c total assist. Pt performed x 2 sit<>stands within the parallel bars with min assist. While standing pt worked on standing balance, posture, and pre-gait stepping forward/retro with each foot, mod assist required to maintain standing balance. During seated rest breaks pt performed seated marches and LAQ for LE strengthening. Pt left seated in w/c at end of session with needs in reach.   Therapy Documentation Precautions:  Precautions Precautions: Fall Precaution Comments: no active abduction  Restrictions Weight Bearing Restrictions: Yes RLE Weight Bearing: Weight bearing as tolerated Other Position/Activity Restrictions: NO active ABDuction   See Function Navigator for Current Functional Status.   Therapy/Group: Individual Therapy  Cresenciano GenreEmily van Schagen, PT, DPT 02/28/2017, 7:55 AM

## 2017-03-01 ENCOUNTER — Inpatient Hospital Stay (HOSPITAL_COMMUNITY): Payer: Medicare Other

## 2017-03-01 ENCOUNTER — Inpatient Hospital Stay (HOSPITAL_COMMUNITY): Payer: Medicare Other | Admitting: Occupational Therapy

## 2017-03-01 DIAGNOSIS — N3281 Overactive bladder: Secondary | ICD-10-CM

## 2017-03-01 NOTE — Progress Notes (Signed)
Physical Therapy Session Note  Patient Details  Name: [name] [name] MRN: [id number] Date of Birth: [birth year]  Today's Date: 03/01/2017 PT Individual Time: [id number], [id number] PT Individual Time Calculation (min): 43 min , 72 min   Short Term Goals: Week 2:  PT Short Term Goal 1 (Week 2): =LTGs due to ELOS  Skilled Therapeutic Interventions/Progress Updates:    Session 1: Pt supine in bed upon PT arrival, agreeable to therapy tx and reports pain in R LE 6/10 from hip to knee. Therapist performed gentle R LE PROM for hip flexion, hip abd/adduction, and knee flexion/extension working on ROM and pain relief. Pt performed 2 x 10 active assist straight leg raises and 2 x 10 SAQ while supine in bed with R LE for strengthening. Pt transferred from supine>sitting EOB with mod assist to help move LEs, increased time secondary to pain. Pt transferred from sitting>standing within stedy with min assist and transferred from w/c>toilet total assist using stedy. Pt performed multiple sit<>stands from stedy with supervision and verbal cues for technique while therapist assisted with clothing management. Pt standing working on standing balance within stedy with single UE support while using other UE to clean peri area. Pt transferred from toilet>sink and performed sit<>stand to stand and wash hands at the sink. Pt transferred back to w/c with stedy total assist. Pt left seated in w/c at end of session with ice applied to R hip and knee for pain relief, needs in reach and QRB in place.   Session 2: Pt seated in w/c upon PT arrival, agreeable to therapy tx and reports pain 6/10 in R LE. Pt propelled w/c x 60 ft using B UEs, increased time to complete and limited by fatigue. Pt seated in w/c performed LE strengthening exercises, 2 x 10 each: LAQ, R active assisted hip flexion, L hip flexion, ball squeezes and ankle pumps/ankle circles. Pt performed sit<>stand from w/c using RW for UE support, max assist, verbal and  manual facilitation for hip/trunk extension, pt able to maintain standing x 50 seconds before fatiguing. Pt used cybex kinetron on 90 cm/sec while seated in w/c working on LE strength and ROM, pt performed 3 bouts of 1 minute. Pt performed scooting in w/c and therapist provided towel roll between knees to promote better positioning in w/c. Pt performed UE strengthening exercises with 1# weights, 2 x 10 each: bicep curls, shoulder flexion, shoulder abduction. Pt transported back to room and left seated in w/c with needs in reach, QRB in place.     Therapy Documentation Precautions:  Precautions Precautions: Fall Precaution Comments: no active abduction  Restrictions Weight Bearing Restrictions: Yes RLE Weight Bearing: Weight bearing as tolerated Other Position/Activity Restrictions: NO active ABDuction   See Function Navigator for Current Functional Status.   Therapy/Group: Individual Therapy  Cresenciano GenreEmily van Schagen, PT, DPT 03/01/2017, 7:51 AM

## 2017-03-01 NOTE — Discharge Summary (Signed)
Discharge summary job # [id number]

## 2017-03-01 NOTE — Progress Notes (Addendum)
Social Work Patient ID: [name] [name], female   DOB: [birth year], 84 y.o.   MRN: [id number]  [facility] reports will have a bed for pt but not sure if tomorrow or Wed, since moving beds around. Have contacted son-Wendall to let him know. [facility]ing [facility] and will let son know and staff know when I hear. Pt aware of potential plan. Spoke with [facility] who will have a bed for pt tomorrow afternoon. Son and pt aware and have made staff aware of plans tomorrow. [facility]

## 2017-03-01 NOTE — Plan of Care (Signed)
  Progressing RH BLADDER ELIMINATION RH STG MANAGE BLADDER WITH ASSISTANCE Description STG Manage Bladder With Min Assistance  03/01/2017 1225 - Progressing by Dani Gobbleeardon, Jolisa Intriago J, RN RH SKIN INTEGRITY RH STG SKIN FREE OF INFECTION/BREAKDOWN Description No new breakdown with min assist   03/01/2017 1225 - Progressing by Dani Gobbleeardon, Cheyanne Lamison J, RN RH STG ABLE TO PERFORM INCISION/WOUND CARE W/ASSISTANCE Description STG Able To Perform Incision[facility]ound Care [facility]  03/01/2017 1225 - Progressing by Dani Gobbleeardon, Mostyn Varnell J, RN RH PAIN MANAGEMENT RH STG PAIN MANAGED AT OR BELOW PT'S PAIN GOAL Description <4 out of 10.   03/01/2017 1225 - Progressing by Dani Gobbleeardon, Argyle Gustafson J, RN

## 2017-03-01 NOTE — Discharge Instructions (Signed)
Inpatient Rehab Discharge Instructions  [name] [name] Discharge date and time: No discharge date for patient encounter.   Activities/Precautions/ Functional Status: Activity: weight bearing as tolerated NO active abduction Diet: regular Wound Care: keep wound clean and dry Functional status:  ___ No restrictions     ___ Walk up steps independently ___ 24/7 supervision/assistance   ___ Walk up steps with assistance ___ Intermittent supervision/assistance  ___ Bathe/dress independently ___ Walk with walker     _x__ Bathe/dress with assistance ___ Walk Independently    ___ Shower independently ___ Walk with assistance    ___ Shower with assistance ___ No alcohol     ___ Return to work/school ________  Special Instructions:    My questions have been answered and I understand these instructions. I will adhere to these goals and the provided educational materials after my discharge from the hospital.  Patient/Caregiver Signature _______________________________ Date __________  Clinician Signature _______________________________________ Date __________  Please bring this form and your medication list with you to all your follow-up doctor's appointments.

## 2017-03-01 NOTE — Progress Notes (Signed)
Occupational Therapy Session Note  Patient Details  Name: [name] [name] MRN: [id number] Date of Birth: Dec 23, 1917  Today's Date: 03/01/2017 OT Individual Time: 1002-1100 OT Individual Time Calculation (min): 58 min    Short Term Goals: Week 2:  OT Short Term Goal 1 (Week 2): Continue working on established LTGs downgraded to max assist overall.  Skilled Therapeutic Interventions/Progress Updates:    Pt completed bathing and dressing sit to stand at the sink this session.  Supervision for UB bathing with min assist needed for UB dressing to fasten her bra after she had donned it over her arms and adjusted it.  She was able to wash her upper and part of her lower legs, but could not reach her feet.  Max assist needed for washing feet, donning TEDs, and donning gripper socks.  She also needed max assist for donning pants over feet and pulling over her hips as well.  Max assist for sit to stand with washing her buttocks and for pulling pants over hips.  She completed oral hygiene in sitting with setup for dentures.  She was left in the wheelchair with call button and phone in reach. Safety belt in place as well.  Ice applied to the right knee and hip.    Therapy Documentation Precautions:  Precautions Precautions: Fall Precaution Comments: no active abduction  Restrictions Weight Bearing Restrictions: No RLE Weight Bearing: Weight bearing as tolerated Other Position/Activity Restrictions: NO active ABDuction  Pain: Pain Assessment Pain Assessment: Faces Pain Score: 2  Faces Pain Scale: Hurts a little bit Pain Type: Acute pain Pain Location: Leg Pain Orientation: Right Pain Descriptors / Indicators: Discomfort Pain Onset: Gradual Patients Stated Pain Goal: 0 Pain Intervention(s): Repositioned ADL: See Function Navigator for Current Functional Status.   Therapy/Group: Individual Therapy  Taletha Twiford OTR/L 03/01/2017, 12:30 PM

## 2017-03-01 NOTE — Progress Notes (Signed)
Subjective/Complaints: Pt seen lying in bed this AM.  She states she slept well overnight.    ROS: Denies CP, SOB, N/V/D  Objective: Vital Signs: Blood pressure (!) 135/55, pulse 89, temperature 98.4 F (36.9 C), temperature source Oral, resp. rate 16, height 4\' 10"  (1.473 m), weight 58.1 kg (128 lb 2.2 oz), SpO2 94 %. No results found. No results found for this or any previous visit (from the past 72 hour(s)).   Gen NAD. Vital signs reviewed. HEENT: normocephalic, atraumatic Cardio: RRR and no JVD Resp: CTA B/L and unabored GI: BS positive and ND Musc/Skel:  Edema and tenderness right thigh  Neuro: Alert  HOH Motor 5/5 in BUE 2+/5 LLE (stable) 3-/5 R HF 3-/5 KE and ankle doris/plantar flexion (some pain inhibition)  Skin: Right hip incision c/d/i   Assessment/Plan: 1. Functional deficits secondary to RIght hip subcapital fx which require 3+ hours per day of interdisciplinary therapy in a comprehensive inpatient rehab setting. Physiatrist is providing close team supervision and 24 hour management of active medical problems listed below. Physiatrist and rehab team continue to assess barriers to discharge/monitor patient progress toward functional and medical goals. FIM: Function - Bathing Position: Wheelchair/chair at sink Body parts bathed by patient: Left arm, Chest, Right arm, Abdomen, Right upper leg, Left upper leg Body parts bathed by helper: Right lower leg, Left lower leg, Back, Buttocks, Front perineal area Bathing not applicable: Left upper leg, Right upper leg, Left lower leg, Right lower leg Assist Level: 2 helpers  Function- Upper Body Dressing/Undressing What is the patient wearing?: Pull over shirt/dress Bra - Perfomed by patient: Thread/unthread right bra strap, Thread/unthread left bra strap, Hook/unhook bra (pull down sports bra) Pull over shirt/dress - Perfomed by patient: Pull shirt over trunk, Thread/unthread right sleeve, Put head through opening Pull  over shirt/dress - Perfomed by helper: Thread/unthread left sleeve Function - Lower Body Dressing/Undressing What is the patient wearing?: Pants, Ted Hose, Non-skid slipper socks Position: Wheelchair/chair at sink Pants- Performed by helper: Thread/unthread right pants leg, Thread/unthread left pants leg, Pull pants up/down Non-skid slipper socks- Performed by helper: Don/doff right sock, Don/doff left sock TED Hose - Performed by helper: Don/doff right TED hose, Don/doff left TED hose Assist for footwear: Dependant Assist for lower body dressing: 2 Helpers  Function - Toileting Toileting steps completed by helper: Adjust clothing prior to toileting, Performs perineal hygiene, Adjust clothing after toileting Assist level: Touching or steadying assistance (Pt.75%)  Function - Archivist transfer activity did not occur: Safety/medical concerns Toilet transfer assistive device: Mechanical lift Mechanical lift: Stedy Assist level to toilet: 2 helpers Assist level from toilet: 2 helpers  Function - Chair/bed transfer Chair/bed transfer method: Other Chair/bed transfer assist level: Total assist (Pt < 25%) Chair/bed transfer assistive device: Sliding board Mechanical lift: Stedy Chair/bed transfer details: Manual facilitation for placement, Manual facilitation for weight shifting, Verbal cues for safe use of DME/AE, Verbal cues for precautions/safety  Function - Locomotion: Wheelchair Will patient use wheelchair at discharge?: Yes Type: Manual Max wheelchair distance: 100 ft Assist Level: Supervision or verbal cues Assist Level: Supervision or verbal cues Wheel 150 feet activity did not occur: Safety/medical concerns Turns around,maneuvers to table,bed, and toilet,negotiates 3% grade,maneuvers on rugs and over doorsills: No Function - Locomotion: Ambulation Ambulation activity did not occur: Safety/medical concerns Assistive device: Walker-rolling Max distance:  7' Assist level: 2 helpers Walk 10 feet activity did not occur: Safety/medical concerns Walk 50 feet with 2 turns activity did not occur: Safety/medical concerns  Walk 150 feet activity did not occur: Safety/medical concerns Walk 10 feet on uneven surfaces activity did not occur: Safety/medical concerns  Function - Comprehension Comprehension: Auditory Comprehension assist level: Understands basic 75 - 89% of the time/ requires cueing 10 - 24% of the time  Function - Expression Expression: Verbal Expression assist level: Expresses basic 75 - 89% of the time/requires cueing 10 - 24% of the time. Needs helper to occlude trach/needs to repeat words.  Function - Social Interaction Social Interaction assist level: Interacts appropriately 75 - 89% of the time - Needs redirection for appropriate language or to initiate interaction.  Function - Problem Solving Problem solving assist level: Solves basic 75 - 89% of the time/requires cueing 10 - 24% of the time  Function - Memory Memory assist level: Recognizes or recalls 25 - 49% of the time/requires cueing 50 - 75% of the time Patient normally able to recall (first 3 days only): That he or she is in a hospital  Medical Problem List and Plan: 1.Decreased functional mobilitysecondary to displaced right femoral neck fracture after fall. Status post right hip hemiarthroplasty anterior approach 02/15/2017. Weightbearing as tolerated no active abduction  Cont CIR  2. DVT Prophylaxis/Anticoagulation: Aspirin 81 mg twice a day.    Vascular study- Neg     3. Pain Management:Hydrocodone as needed   Added Voltaren gel for right shoulder as well, with benefit    Controlled on 1/13 4. Mood:Provide emotional support 5. Neuropsych: This patientisnot fully capable of making decisions on herown behalf. 6. Skin/Wound Care:Routine skin checks 7. Fluids/Electrolytes/Nutrition:Routine I&O's 8.Acute blood loss anemia.    Hgb 10.8 on  1/10 9.History of CVA. Continue Plavix aspirin prior to admission. 10.Diet controlled diabetes mellitus. Patient on no oral diabetic agents prior to admission. CBG (last 3)  No results for input(s): GLUCAP in the last 72 hours.    Controlled 1/10 11.Hypertension. Demadex 10 mg daily. Monitor with increased mobility   Relatively controlled on 1/14 12.CKDstage III    Cr 1.03 on 1/10   Cont to monitor 13.History of dizziness. Continue Antivert as needed 14.History of gout. Zyloprim 300 mg at bedtime. Monitor for any gout flare ups 15.History of overactive bladder. Toviaz 4 mg daily.  16.Hyperlipidemia. Lipitor 17.Constipation. Laxative assistance 18.  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 Ecoli UTI    Macrodantin completed 1/5-1/11 20. Right knee OA    Xray reviewed, showing OA    Cont voltaren gel  LOS (Days) 11 A FACE TO FACE EVALUATION WAS PERFORMED  Roddy Bellamy Karis Jubanil Arvel Oquinn 03/01/2017, 7:51 AM

## 2017-03-01 NOTE — Progress Notes (Signed)
Physical Therapy Session Note  Patient Details  Name: [name] [name] MRN: [id number] Date of Birth: [birth year]  Today's Date: 03/01/2017 PT Individual Time: 1135-1202 PT Individual Time Calculation (min): 27 min   Short Term Goals: Week 2:  PT Short Term Goal 1 (Week 2): =LTGs due to ELOS  Skilled Therapeutic Interventions/Progress Updates: Pt presented in w/c agreeable to therapy. Pt indicated pain has decreased from earlier in am approx 6/10. Pt transported to rehab gym total assist for energy conservation. Participated in sit to/from stand in parallel bars and engaged in advancing RLE forward/backwards for tolerance to wt shifting. Pt able to perform up to x 3 times before requiring sitting. Pt performed x 3 trials sit/stand in bars. Pt then participated in [place] progressing to AROM/LAQ to fatigue. Pt returned to room and remained in w/c at end of session, PTA set up lunch tray and left pt with current needs met.      Therapy Documentation Precautions:  Precautions Precautions: Fall Precaution Comments: no active abduction  Restrictions Weight Bearing Restrictions: No RLE Weight Bearing: Weight bearing as tolerated Other Position/Activity Restrictions: NO active ABDuction General:   Vital Signs: Therapy Vitals Temp: (!) 97.4 F (36.3 C) Temp Source: Oral Pulse Rate: 86 Resp: 14 BP: (!) 124/38 Patient Position (if appropriate): Sitting Oxygen Therapy SpO2: 100 % O2 Device: Not Delivered   See Function Navigator for Current Functional Status.   Therapy/Group: Individual Therapy  Willis Holquin  Vasilios [place]taway, PTA 03/01/2017, 4:37 PM

## 2017-03-02 ENCOUNTER — Inpatient Hospital Stay (HOSPITAL_COMMUNITY): Payer: Medicare Other | Admitting: Occupational Therapy

## 2017-03-02 ENCOUNTER — Inpatient Hospital Stay (HOSPITAL_COMMUNITY): Payer: Medicare Other

## 2017-03-02 ENCOUNTER — Inpatient Hospital Stay (HOSPITAL_COMMUNITY): Payer: Medicare Other | Admitting: Physical Therapy

## 2017-03-02 MED ORDER — DICLOFENAC SODIUM 1 % TD GEL: 2.0000 g | Freq: Four times a day (QID) | TRANSDERMAL | Status: AC

## 2017-03-02 MED ORDER — TRAMADOL HCL 50 MG PO TABS: 50.0000 mg | ORAL_TABLET | Freq: Four times a day (QID) | ORAL | 0 refills | Status: DC | PRN

## 2017-03-02 MED ORDER — SENNOSIDES-DOCUSATE SODIUM 8.6-50 MG PO TABS: 2.0000 | ORAL_TABLET | Freq: Two times a day (BID) | ORAL | Status: AC

## 2017-03-02 MED ORDER — FESOTERODINE FUMARATE ER 4 MG PO TB24: 4.0000 mg | ORAL_TABLET | Freq: Every day | ORAL | Status: DC

## 2017-03-02 NOTE — Progress Notes (Signed)
Subjective/Complaints: Pt seen lying in bed this AM.  She slept well overnight.  He was not aware of discharge today.   ROS: Denies CP, SOB, N/V/D  Objective: Vital Signs: Blood pressure (!) 138/91, pulse 97, temperature 98.5 F (36.9 C), temperature source Oral, resp. rate 16, height 4\' 10"  (1.473 m), weight 58.1 kg (128 lb 2.2 oz), SpO2 100 %. No results found. No results found for this or any previous visit (from the past 72 hour(s)).   Gen NAD. Vital signs reviewed. HEENT: normocephalic, atraumatic Cardio: RRR and no JVD Resp: CTA B/L and unabored GI: BS positive and ND Musc/Skel:  Edema and tenderness right thigh  Neuro: Alert  HOH Motor 5/5 in BUE 2+/5 LLE (unchanged) 3-/5 R HF 3-/5 KE and ankle doris/plantar flexion (some pain inhibition)  Skin: Right hip incision c/d/i   Assessment/Plan: 1. Functional deficits secondary to RIght hip subcapital fx which require 3+ hours per day of interdisciplinary therapy in a comprehensive inpatient rehab setting. Physiatrist is providing close team supervision and 24 hour management of active medical problems listed below. Physiatrist and rehab team continue to assess barriers to discharge/monitor patient progress toward functional and medical goals. FIM: Function - Bathing Position: Wheelchair/chair at sink Body parts bathed by patient: Left arm, Chest, Right arm, Abdomen, Right upper leg, Left upper leg Body parts bathed by helper: Right lower leg, Left lower leg, Back, Buttocks, Front perineal area Bathing not applicable: Left upper leg, Right upper leg, Left lower leg, Right lower leg Assist Level: 2 helpers  Function- Upper Body Dressing/Undressing What is the patient wearing?: Pull over shirt/dress, Bra Bra - Perfomed by patient: Thread/unthread right bra strap, Thread/unthread left bra strap Bra - Perfomed by helper: Hook/unhook bra (pull down sports bra) Pull over shirt/dress - Perfomed by patient: Thread/unthread right  sleeve, Thread/unthread left sleeve, Put head through opening, Pull shirt over trunk Pull over shirt/dress - Perfomed by helper: Thread/unthread left sleeve Assist Level: Supervision or verbal cues Function - Lower Body Dressing/Undressing What is the patient wearing?: Pants, Ted Hose, Non-skid slipper socks Position: Wheelchair/chair at sink Pants- Performed by helper: Thread/unthread right pants leg, Thread/unthread left pants leg, Pull pants up/down Non-skid slipper socks- Performed by helper: Don/doff right sock, Don/doff left sock TED Hose - Performed by helper: Don/doff right TED hose, Don/doff left TED hose Assist for footwear: Dependant Assist for lower body dressing: 2 Helpers  Function - Toileting Toileting steps completed by helper: Adjust clothing prior to toileting, Performs perineal hygiene, Adjust clothing after toileting Assist level: Two helpers  Function - Archivist transfer activity did not occur: Safety/medical concerns Toilet transfer assistive device: Systems developer lift: Stedy Assist level to toilet: Maximal assist (Pt 25 - 49%/lift and lower) Assist level from toilet: Maximal assist (Pt 25 - 49%/lift and lower)  Function - Chair/bed transfer Chair/bed transfer method: Other Chair/bed transfer assist level: Total assist (Pt < 25%) Chair/bed transfer assistive device: Sliding board Mechanical lift: Stedy Chair/bed transfer details: Verbal cues for safe use of DME/AE, Verbal cues for precautions/safety  Function - Locomotion: Wheelchair Will patient use wheelchair at discharge?: Yes Type: Manual Max wheelchair distance: 100 ft Assist Level: Supervision or verbal cues Assist Level: Supervision or verbal cues Wheel 150 feet activity did not occur: Safety/medical concerns Turns around,maneuvers to table,bed, and toilet,negotiates 3% grade,maneuvers on rugs and over doorsills: No Function - Locomotion: Ambulation Ambulation activity  did not occur: Safety/medical concerns Assistive device: Walker-rolling Max distance: 7' Assist level: 2 helpers Walk  10 feet activity did not occur: Safety/medical concerns Walk 50 feet with 2 turns activity did not occur: Safety/medical concerns Walk 150 feet activity did not occur: Safety/medical concerns Walk 10 feet on uneven surfaces activity did not occur: Safety/medical concerns  Function - Comprehension Comprehension: Auditory Comprehension assist level: Understands basic 75 - 89% of the time/ requires cueing 10 - 24% of the time  Function - Expression Expression: Verbal Expression assist level: Expresses basic 75 - 89% of the time/requires cueing 10 - 24% of the time. Needs helper to occlude trach/needs to repeat words.  Function - Social Interaction Social Interaction assist level: Interacts appropriately 75 - 89% of the time - Needs redirection for appropriate language or to initiate interaction.  Function - Problem Solving Problem solving assist level: Solves basic 75 - 89% of the time/requires cueing 10 - 24% of the time  Function - Memory Memory assist level: Recognizes or recalls 50 - 74% of the time/requires cueing 25 - 49% of the time Patient normally able to recall (first 3 days only): That he or she is in a hospital  Medical Problem List and Plan: 1.Decreased functional mobilitysecondary to displaced right femoral neck fracture after fall. Status post right hip hemiarthroplasty anterior approach 02/15/2017. Weightbearing as tolerated no active abduction  D/c today  Will see patient for hospital follow up in 1 month 2. DVT Prophylaxis/Anticoagulation: Aspirin 81 mg twice a day.    Vascular study- Neg     3. Pain Management:Hydrocodone as needed   Added Voltaren gel for right shoulder as well, with benefit    Controlled on 1/15 4. Mood:Provide emotional support 5. Neuropsych: This patientisnot fully capable of making decisions on herown behalf. 6.  Skin/Wound Care:Routine skin checks 7. Fluids/Electrolytes/Nutrition:Routine I&O's 8.Acute blood loss anemia.    Hgb 10.8 on 1/10 9.History of CVA. Continue Plavix aspirin prior to admission. 10.Diet controlled diabetes mellitus. Patient on no oral diabetic agents prior to admission. CBG (last 3)  No results for input(s): GLUCAP in the last 72 hours.    Controlled 1/10 11.Hypertension. Demadex 10 mg daily. Monitor with increased mobility   Relatively controlled on 1/15 12.CKDstage III    Cr 1.03 on 1/10   Cont to monitor 13.History of dizziness. Continue Antivert as needed 14.History of gout. Zyloprim 300 mg at bedtime. Monitor for any gout flare ups 15.History of overactive bladder. Toviaz 4 mg daily.  16.Hyperlipidemia. Lipitor 17.Constipation. Laxative assistance 18.  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 Ecoli UTI    Macrodantin completed 1/5-1/11 20. Right knee OA    Xray reviewed, showing OA    Cont voltaren gel  LOS (Days) 12 A FACE TO FACE EVALUATION WAS PERFORMED  Ankit Karis Jubanil Patel 03/02/2017, 7:52 AM

## 2017-03-02 NOTE — Progress Notes (Signed)
Occupational Therapy Discharge Summary  Patient Details  Name: [name] [name] MRN: [id number] Date of Birth: 03-Feb-1918  Today's Date: 03/02/2017 OT Individual Time: 1101-1200 OT Individual Time Calculation (min): 59 min    Session Note:  Pt completed bathing and dressing sit to stand at the sink during session.  Supervision for UB bathing except for washing her back.  Min assist for UB dressing as well.  Max assist sit to stand for LB bathing and dressing.  She still demonstrates decreased ability to flex her knee with sit to stand and is really reliant on pulling herself up from the chair using a stable surface vs pushing up from the wheelchair.  She needed assistance with starting all LB clothing her her feet but then once standing she attempted to assist with pulling them over her hips, however she needed assist with this as well.  Pt left in wheelchair at end of session with call button and phone in reach.    Patient has met 9 of 9 long term goals due to improved balance and ability to compensate for deficits.  Patient to discharge at [facility] Max Assist level.  Patient's care partner unavailable to provide the necessary physical assistance at discharge.    Reasons goals not met: NA  Recommendation:  Patient will benefit from ongoing skilled OT services in skilled nursing facility setting to continue to advance functional skills in the area of BADL and Reduce care partner burden.  Pt still requires min assist for UB selfcare tasks and max assist for LB selfcare tasks sit to stand.  Functional transfers are at a mod assist using the sliding board or max assist when attempting stand pivot.  Family cannot provide 24 hour physical assist so recommend SNF for follow-up therapy.    Equipment: No equipment provided  Reasons for discharge: treatment goals met and discharge from hospital  Patient/family agrees with progress made and goals achieved: Yes  OT Discharge Precautions/Restrictions   Precautions Precautions: Fall Precaution Comments: no active abduction  Restrictions Weight Bearing Restrictions: No RLE Weight Bearing: Weight bearing as tolerated Other Position/Activity Restrictions: NO active ABDuction  Pain Pain Assessment Pain Assessment: Faces Faces Pain Scale: Hurts a little bit Pain Type: Surgical pain Pain Location: Leg Pain Orientation: Right Pain Descriptors / Indicators: Discomfort Pain Onset: With Activity Pain Intervention(s): Emotional support;Repositioned ADL  See Function Section of chart for details  Vision Baseline Vision/History: Wears glasses Wears Glasses: At all times Patient Visual Report: No change from baseline Perception  Perception: Within Functional Limits Praxis Praxis: Intact Cognition Overall Cognitive Status: History of cognitive impairments - at baseline Arousal/Alertness: Awake/alert Orientation Level: Oriented to person;Oriented to place;Oriented to situation Attention: Sustained Sustained Attention: Appears intact Memory: Impaired Memory Impairment: Storage deficit;Decreased recall of new information Awareness: Impaired Awareness Impairment: Intellectual impairment;Anticipatory impairment Problem Solving: Impaired Problem Solving Impairment: Verbal basic;Functional basic Safety/Judgment: Impaired Comments: Pt demonstrates decreased awareness of her hip precaution, and needs cueing daily for sequencing of sit to stand or functional transfers.   Sensation Sensation Light Touch: Appears Intact Stereognosis: Appears Intact Hot/Cold: Appears Intact Proprioception: Appears Intact Additional Comments: Sensation intact in BLEs per gross assessment Coordination Gross Motor Movements are Fluid and Coordinated: Yes Fine Motor Movements are Fluid and Coordinated: Yes Coordination and Movement Description: UE coordination both FM and gross motor are WFLs for age. Motor  Motor Motor: Within Functional Limits Motor -  Skilled Clinical Observations: generalized weakness with pain during transitional movements Motor - Discharge Observations: Generalized weakness Mobility  See Function Section of chart for details  Trunk/Postural Assessment  Cervical Assessment Cervical Assessment: Exceptions to WFL(forward head) Thoracic Assessment Thoracic Assessment: Exceptions to WFL(thoracic rounding and kyphosis) Lumbar Assessment Lumbar Assessment: Exceptions to WFL(lumbar kyphosis) Postural Control Postural Control: Deficits on evaluation(decreased lumbar extension and thoracic extension in standing.  Pt exhibits increased lean to the left with trunk and shoulders.)  Balance Balance Balance Assessed: Yes Static Sitting Balance Static Sitting - Balance Support: Feet supported Static Sitting - Level of Assistance: 6: Modified independent (Device/Increase time) Dynamic Sitting Balance Dynamic Sitting - Balance Support: During functional activity Dynamic Sitting - Level of Assistance: 5: Stand by assistance Static Standing Balance Static Standing - Balance Support: During functional activity Static Standing - Level of Assistance: 4: Min assist Dynamic Standing Balance Dynamic Standing - Balance Support: During functional activity Dynamic Standing - Level of Assistance: 3: Mod assist Extremity/Trunk Assessment RUE Assessment RUE Assessment: Exceptions to [facility] WFLS, shoulder flexion 3/5 all other joints 4/5 for age and build) LUE Assessment LUE Assessment: Exceptions to [facility] WFLS for all joints, strength 4/5 throughout based on age and build)   See Function Navigator for Current Functional Status.  Nayef College OTR/L 03/02/2017, 12:52 PM

## 2017-03-02 NOTE — Discharge Summary (Signed)
NAMGifford [name]:  [name] [name]                ACCOUNT NO.:  [id number]  MEDICAL RECORD NO.:  [id number]  LOCATION:  4M01C                        FACILITY:  [facility]  PHYSICIAN:  Maryla MorrowAnkit Patel, MD        DATE OF BIRTH:  [birth year]  DATE OF ADMISSION:  02/18/2017 DATE OF DISCHARGE:  03/02/2017                              DISCHARGE SUMMARY   DISCHARGE DIAGNOSES: 1. Displaced right femoral neck fracture after a fall, status post     right hip hemiarthroplasty, anterior approach, February 15, 2017. 2. Deep vein thrombosis prophylaxis - aspirin 81 mg twice daily. 3. Pain management. 4. Acute blood loss anemia. 5. History of cerebrovascular accident. 6. Diet-controlled diabetes mellitus. 7. Hypertension. 8. Chronic kidney disease stage 3. 9. History of dizziness. 10.History of gout. 11.History of overactive bladder. 12.Hyperlipidemia. 13.Constipation. 14.Escherichia coli urinary tract infection.  [name] with history of diet- controlled diabetes mellitus, on no oral diabetic medications, CKD stage 3, CVA, and hypertension.  Lives with her son.  One-level home with a ramped entrance.  Used to rolling walker prior to admission.  Presented on February 15, 2017, after mechanical fall, landing on her right hip. No loss of consciousness.  X-rays and imaging revealed displaced right femoral neck fracture.  Underwent right hip hemiarthroplasty, anterior approach, February 15, 2017, per Dr. Linna CapriceSwinteck.  Hospital course, pain management.  Weightbearing as tolerated.  No active abduction.  Acute blood loss anemia, 6.6, transfused, followup hemoglobin 10.5.  Remained on Plavix as prior to admission as well as aspirin which is increased to 81 mg twice daily for DVT prophylaxis as well as history of CVA. Physical and occupational therapy ongoing.  The patient was admitted for a comprehensive rehab program.  PAST MEDICAL HISTORY:  See discharge diagnoses.  SOCIAL HISTORY:   Lives with son.  Used rolling walker prior to admission.  FUNCTIONAL STATUS UPON ADMISSION TO REHAB SERVICES:  Max assist squat pivot transfers, +2 physical assist sit to stand, +2 physical assist sit to supine, mod to max assist activities of daily living.  PHYSICAL EXAMINATION:  VITAL SIGNS:  Blood pressure 142/57, pulse 86, temperature 98, and respirations 18. GENERAL:  Alert female, in no acute distress, hard of hearing.  Speech is a bit delayed. HEENT:  EOMs intact. NECK:  Supple.  Nontender.  No JVD. CARDIAC:  Rate controlled. ABDOMEN:  Soft, nontender.  Good bowel sounds. LUNGS:  Clear to auscultation without wheeze. SKIN:  Incision site was dressed, appropriately tender.  REHABILITATION HOSPITAL COURSE:  The patient was admitted to Inpatient Rehab Services with therapies initiated on a 3-hour daily basis, consisting of physical therapy, occupational therapy, and rehabilitation nursing.  The following issues were addressed during the patient's rehabilitation stay.  Pertaining to Ms. Alper's displaced right femoral neck fracture, she had undergone right hip hemiarthroplasty, anterior approach, February 15, 2017.  Weightbearing as tolerated with no active abduction.  She would follow up with Orthopedic Service, Dr. Linna CapriceSwinteck. She remained on aspirin 81 mg twice daily for DVT prophylaxis.  Vascular study negative.  She had been on aspirin and Plavix prior to admission for history of CVA.  Pain management with the use of  hydrocodone as needed.  Changed to Ultram for monitoring of mental status.  She was using Voltaren gel 4 times daily for some right shoulder arthritic changes.  Acute blood loss anemia, stable, hemoglobin 10.8.  She would continue Plavix and aspirin for history of CVA.  Diabetes mellitus, no oral agents prior to admission.  Blood sugars well controlled.  Her diet was changed to regular for simplifying of her diet.  Blood pressures monitored, no orthostatic  changes.  She had been on Demadex prior to admission.  History of dizziness, using Antivert 25 mg 3 times daily as needed.  Constipation with bowel program established.  She did have a history of overactive bladder.  Maintained on Toviaz.  Postvoid residuals within acceptable limits.  She completed a course of Macrodantin for E. coli UTI.  CKD stage 3, latest creatinine 1.03 and monitored.  The patient received weekly collaborative interdisciplinary team conferences to discuss estimated length of stay, family teaching, any barriers to her discharge.  Working with energy conservation techniques.  Transferred supine to sit, edge of bed, moderate assist to help move lower extremities.  Transferred from sitting to standing with steady minimal assistance.  Transferred from wheelchair to toilet total assist.  Transferred sit to stand with steady minimal assist.  Performed multiple sit to stands with supervision.  She could ambulate only a couple of steps, propel her wheelchair with upper extremities.  Due to limited overall progress and assistance at home, it was felt skilled nursing facility was needed with bed becoming available on March 02, 2017.  DISCHARGE MEDICATIONS: 1. Aspirin 81 mg p.o. b.i.d. 2. Lipitor 20 mg p.o. daily. 3. Plavix 75 mg p.o. daily. 4. Voltaren gel 4 times daily to affected area. 5. Pepcid 20 mg p.o. daily. 6. Toviaz p.o. b.i.d. 4 mg p.o. daily. 7. Claritin 10 mg p.o. daily. 8. Senokot-S 2 tablets p.o. b.i.d. 9. Antivert 25 mg p.o. t.i.d. as needed. 10.Ultram 50 mg p.o. every 6 hours as needed pain. 11.Demadex 10 mg by mouth daily as needed. 12.Zyloprim 300 mg p.o. at bedtime.  DIET:  Her diet was regular.  FOLLOWUP:  She would follow up with Dr. Maryla Morrow at the Outpatient Rehab Service office as directed; Dr. Samson Frederic in 2 weeks, call for appointment.  SPECIAL INSTRUCTIONS:  Weightbearing as tolerated.  No active abduction.     Mariam Dollar, P.A.   ______________________________ Maryla Morrow, MD    DA/MEDQ  D:  03/01/2017  T:  03/02/2017  Job:  [id number]  cc:   Samson Frederic, MD Dr. Corrin Parker Maryla Morrow, MD

## 2017-03-02 NOTE — Progress Notes (Signed)
Social Work  Discharge Note  The overall goal for the admission was met for:   Discharge location: Yes-[facility]  Length of Stay: Yes-12 DAYS  Discharge activity level: Yes-MIN/MOD LEVEL OF ASSIST  Home/community participation: Yes  Services provided included: MD, RD, PT, OT, RN, CM, Pharmacy and SW  Financial Services: Medicare and Medicaid  Follow-up services arranged: Other: SHORT TERM NHP  Comments (or additional information):PT NOT AT A LEVEL SON CAN TAKE HER HOME, [place].  Patient/Family verbalized understanding of follow-up arrangements: Yes  Individual responsible for coordination of the follow-up plan: WENDALL-SON  Confirmed correct DME delivered: Elease Hashimoto 03/02/2017    Elease Hashimoto

## 2017-03-02 NOTE — Progress Notes (Signed)
Physical Therapy Discharge Summary  Patient Details  Name: [name] [name] MRN: [id number] Date of Birth: Mar 11, 1917  Today's Date: 03/02/2017 PT Individual Time: 0900-0959 PT Individual Time Calculation (min): 59 min    Patient has met 6 of 7 long term goals due to improved activity tolerance, improved balance, increased strength, increased range of motion and decreased pain.  Patient to discharge at a wheelchair level [place].   Patient's care partner unavailable to provide the necessary physical assistance at discharge. Pt will be d/c to a SNF since family is unable to provide the necessary assistance needed.   Reasons goals not met: Pt did not meet bed mobility goal secondary to limited LE strength and R LE pain limiting functional movements.   Recommendation:  Patient will benefit from ongoing skilled PT services in skilled nursing facility setting to continue to advance safe functional mobility, address ongoing impairments in balance, strength, ROM, pain, and minimize fall risk.  Equipment: No equipment provided  Reasons for discharge: treatment goals met and discharge from hospital  Patient/family agrees with progress made and goals achieved: Yes   Treatment Interventions: Pt seated in w/c upon PT arrival, agreeable to therapy tx and reports pain 6/10 in R LE. Pt propelled w/c from room>gym using B UEs with increased time and with supervision. Pt performed slideboard transfer from w/c<>mat with min assist, verbal and tactile cues for technique. Pt performs bed mobility with mod assist to help lift/move LEs secondary to weakness and pain. Pt performed sit<>stand within parallel bars with min assist and ambulated within parallel bars x 10 ft with mod assist. Pt used kinetron while seated in w/c for B LE strengthening and ROM x 4 minutes. Pt seated in w/c performed 2 x 10 LAQ and hip flexion bilaterally for strengthening with verbal cues for techniques. Pt performed car transfer from  w/c<>car with slideboard and min assist, increased time to complete. Pt left seated in w/c at end of session with needs in reach and QRB in place.   PT Discharge Precautions/Restrictions Precautions Precautions: Fall Precaution Comments: no active abduction  Restrictions Weight Bearing Restrictions: Yes RLE Weight Bearing: Weight bearing as tolerated Other Position/Activity Restrictions: NO active ABDuction Pain Pain Assessment Pain Assessment: No/denies pain Pain Score: 0-No pain  Cognition Overall Cognitive Status: History of cognitive impairments - at baseline Arousal/Alertness: Awake/alert Orientation Level: Oriented to person;Oriented to place;Oriented to situation Memory: Impaired Memory Impairment: Storage deficit;Decreased recall of new information Awareness: Impaired Problem Solving: Impaired Safety/Judgment: Impaired Sensation Sensation Light Touch: Appears Intact Proprioception: Appears Intact Additional Comments: Sensation intact in BLEs per gross assessment Coordination Gross Motor Movements are Fluid and Coordinated: No Fine Motor Movements are Fluid and Coordinated: No Coordination and Movement Description: LE coordination impaired secondary to R hip fracture, pain and precautions limiting hip abduction Motor  Motor Motor - Skilled Clinical Observations: generalized weakness with pain during transitional movements  Trunk/Postural Assessment  Cervical Assessment Cervical Assessment: Exceptions to WFL(forward head posture) Thoracic Assessment Thoracic Assessment: Exceptions to WFL(kyphotic) Lumbar Assessment Lumbar Assessment: Exceptions to WFL(posterior pelvic tilt) Postural Control Postural Control: Within Functional Limits  Balance Balance Balance Assessed: Yes Static Sitting Balance Static Sitting - Level of Assistance: 5: Stand by assistance Dynamic Sitting Balance Dynamic Sitting - Level of Assistance: 5: Stand by assistance Static Standing  Balance Static Standing - Level of Assistance: 4: Min assist Dynamic Standing Balance Dynamic Standing - Level of Assistance: 3: Mod assist Extremity Assessment  RLE Assessment RLE Assessment: Exceptions to WFL(Strength grossly  3/5 throughout as seen functionally, limited hip ROM throughout and limited knee flexion, limited ankle DF) LLE Assessment LLE Assessment: Exceptions to WFL(Hip grossly 3+/5, knee strength grossly 4/5, ankle 3-/5. Limited hip extension, limited knee flexion and limited ankle DF)   See Function Navigator for Current Functional Status.  Netta Corrigan, PT, DPT 03/02/2017, 12:04 PM

## 2017-03-02 NOTE — Progress Notes (Signed)
Patient transported via ambulance at 1255 to [place] [facility]. Patient went with belongings. Report called to 400 hall nurse at 1315.

## 2017-03-30 ENCOUNTER — Encounter (HOSPITAL_COMMUNITY): Payer: Self-pay | Admitting: *Deleted

## 2017-03-30 ENCOUNTER — Ambulatory Visit: Payer: Self-pay | Admitting: Orthopedic Surgery

## 2017-03-31 ENCOUNTER — Encounter (HOSPITAL_COMMUNITY): Payer: Self-pay | Admitting: *Deleted

## 2017-03-31 NOTE — Progress Notes (Signed)
[facility] [place] [facility] received preop instructions per nurse.

## 2017-03-31 NOTE — Progress Notes (Addendum)
Preop instructions for: [name] [name]                         Date of Birth     10-06-1917                        Date of Procedure: 04/01/2017       Doctor: Dr Arlys John Swinteck  Time to arrive at [facility]:  1545 Report to: Admitting  Procedure: Debridement Right Hip  Any procedure time changes, MD office will notify you!   Do not eat solid food   past midnight the night before your procedure.(To include any tube feedings-must be discontinued) Reminder:Follow bowel prep instructions per MD office! May have clear liquids from 12 midnite until 1230pm day of surgery.     CLEAR LIQUID DIET   Foods Allowed                                                                     Foods Excluded  Coffee and tea, regular and decaf                             liquids that you cannot  Plain Jell-O in any flavor                                             see through such as: Fruit ices (not with fruit pulp)                                     milk, soups, orange juice  Iced Popsicles                                    All solid food Carbonated beverages, regular and diet                                    Cranberry, grape and apple juices Sports drinks like Gatorade Lightly seasoned clear broth or consume(fat free) Sugar, honey syrup  Sample Menu Breakfast                                Lunch                                     Supper Cranberry juice                    Beef broth                            Chicken broth Jell-O  Grape juice                           Apple juice Coffee or tea                        Jell-O                                      Popsicle                                                Coffee or tea                        Coffee or tea  _____________________________________________________________________     Take these morning medications only with sips of water.(or give through gastrostomy or feeding  tube). Pepcid ( if takes in am), Toviaz ( if takes in am), Claritin ( if takes in am  Note: No Insulin or Diabetic meds should be given or taken the morning of the procedure!   Facility contact:    [facility] [place]                Phone: [phone]                 Health Care POA:  Transportation [facility] phone#: [facility] [place]   Please send day of procedure:current med list and meds last taken that day, confirm nothing by mouth status from what time, Patient Demographic info( to include DNR status, problem list, allergies)   RN contact name/phone#:  Cyndia DiverKarla Sanjit Mcmichael RN [phone]                            and Fax #[phone]  Bring Insurance card and picture ID Leave all jewelry and other valuables at place where living( no metal or rings to be worn) No contact lens Women-no make-up, no lotions,perfumes,powders Men-no colognes,lotions  Any questions day of procedure,call Short Stay [phone]!   Sent from :[facility]                   Phone:[phone]                   [phone]  Sent by :Cyndia DiverKarla Machele Deihl RN

## 2017-03-31 NOTE — Progress Notes (Signed)
REquested current MAR , demograhic info and health care info if available from [facility] [place] on 03/30/2017.   [facility] [phone].  Per nurse did not receive preop instructions.   refaxed preop instructions to fax [phone] per [facility] request.   Asked [facility] at [facility] had she received any preop instructions regarding Plavix.  Nurse stated she had not.   Called and spoke with Halford DecampLaurie Faust regarding Preop Plavix instructions and Halford DecampLaurie Faust stated she had not heard back from MD at [facility].

## 2017-03-31 NOTE — Progress Notes (Signed)
Spoke with wife of son, Binnie KandRobert, [place] and made her aware surgery time at 645pm on 04/01/17 with arrival time of 345pm.   Called and left voice mail message on phone of Windell Roupp with surgery date and time and arrival time.

## 2017-04-01 ENCOUNTER — Inpatient Hospital Stay (HOSPITAL_COMMUNITY)
Admission: RE | Admit: 2017-04-01 | Discharge: 2017-04-07 | DRG: 463 | Disposition: A | Discharge status: Skilled Nursing Facility | Payer: Medicare Other | Attending: Orthopedic Surgery | Admitting: Orthopedic Surgery

## 2017-04-01 ENCOUNTER — Inpatient Hospital Stay (HOSPITAL_COMMUNITY): Payer: Medicare Other | Admitting: Anesthesiology

## 2017-04-01 ENCOUNTER — Inpatient Hospital Stay (HOSPITAL_COMMUNITY): Payer: Medicare Other

## 2017-04-01 ENCOUNTER — Encounter (HOSPITAL_COMMUNITY)
Admission: RE | Disposition: A | Discharge status: Skilled Nursing Facility | Payer: Self-pay | Source: Home / Self Care | Attending: Orthopedic Surgery

## 2017-04-01 ENCOUNTER — Encounter (HOSPITAL_COMMUNITY): Payer: Self-pay | Admitting: *Deleted

## 2017-04-01 DIAGNOSIS — W19XXXA Unspecified fall, initial encounter: Secondary | ICD-10-CM | POA: Diagnosis present

## 2017-04-01 DIAGNOSIS — Z8673 Personal history of transient ischemic attack (TIA), and cerebral infarction without residual deficits: Secondary | ICD-10-CM

## 2017-04-01 DIAGNOSIS — R059 Cough, unspecified: Secondary | ICD-10-CM

## 2017-04-01 DIAGNOSIS — E1122 Type 2 diabetes mellitus with diabetic chronic kidney disease: Secondary | ICD-10-CM | POA: Diagnosis present

## 2017-04-01 DIAGNOSIS — E1152 Type 2 diabetes mellitus with diabetic peripheral angiopathy with gangrene: Secondary | ICD-10-CM | POA: Diagnosis present

## 2017-04-01 DIAGNOSIS — M1990 Unspecified osteoarthritis, unspecified site: Secondary | ICD-10-CM | POA: Diagnosis present

## 2017-04-01 DIAGNOSIS — Z978 Presence of other specified devices: Secondary | ICD-10-CM | POA: Diagnosis not present

## 2017-04-01 DIAGNOSIS — D62 Acute posthemorrhagic anemia: Secondary | ICD-10-CM | POA: Diagnosis not present

## 2017-04-01 DIAGNOSIS — Y831 Surgical operation with implant of artificial internal device as the cause of abnormal reaction of the patient, or of later complication, without mention of misadventure at the time of the procedure: Secondary | ICD-10-CM | POA: Diagnosis present

## 2017-04-01 DIAGNOSIS — T8131XA Disruption of external operation (surgical) wound, not elsewhere classified, initial encounter: Secondary | ICD-10-CM | POA: Diagnosis present

## 2017-04-01 DIAGNOSIS — Z419 Encounter for procedure for purposes other than remedying health state, unspecified: Secondary | ICD-10-CM

## 2017-04-01 DIAGNOSIS — T8451XA Infection and inflammatory reaction due to internal right hip prosthesis, initial encounter: Secondary | ICD-10-CM | POA: Diagnosis present

## 2017-04-01 DIAGNOSIS — Z91030 Bee allergy status: Secondary | ICD-10-CM

## 2017-04-01 DIAGNOSIS — Z880 Allergy status to penicillin: Secondary | ICD-10-CM | POA: Diagnosis not present

## 2017-04-01 DIAGNOSIS — Z96641 Presence of right artificial hip joint: Secondary | ICD-10-CM | POA: Diagnosis present

## 2017-04-01 DIAGNOSIS — E785 Hyperlipidemia, unspecified: Secondary | ICD-10-CM | POA: Diagnosis present

## 2017-04-01 DIAGNOSIS — Z09 Encounter for follow-up examination after completed treatment for conditions other than malignant neoplasm: Secondary | ICD-10-CM

## 2017-04-01 DIAGNOSIS — T8132XD Disruption of internal operation (surgical) wound, not elsewhere classified, subsequent encounter: Secondary | ICD-10-CM | POA: Diagnosis not present

## 2017-04-01 DIAGNOSIS — Z823 Family history of stroke: Secondary | ICD-10-CM

## 2017-04-01 DIAGNOSIS — Z8781 Personal history of (healed) traumatic fracture: Secondary | ICD-10-CM | POA: Diagnosis not present

## 2017-04-01 DIAGNOSIS — S72001A Fracture of unspecified part of neck of right femur, initial encounter for closed fracture: Secondary | ICD-10-CM | POA: Diagnosis present

## 2017-04-01 DIAGNOSIS — R05 Cough: Secondary | ICD-10-CM | POA: Diagnosis not present

## 2017-04-01 DIAGNOSIS — N3281 Overactive bladder: Secondary | ICD-10-CM | POA: Diagnosis present

## 2017-04-01 DIAGNOSIS — N183 Chronic kidney disease, stage 3 (moderate): Secondary | ICD-10-CM | POA: Diagnosis present

## 2017-04-01 DIAGNOSIS — B9620 Unspecified Escherichia coli [E. coli] as the cause of diseases classified elsewhere: Secondary | ICD-10-CM | POA: Diagnosis present

## 2017-04-01 DIAGNOSIS — Z7902 Long term (current) use of antithrombotics/antiplatelets: Secondary | ICD-10-CM

## 2017-04-01 DIAGNOSIS — N170 Acute kidney failure with tubular necrosis: Secondary | ICD-10-CM | POA: Diagnosis not present

## 2017-04-01 DIAGNOSIS — M009 Pyogenic arthritis, unspecified: Secondary | ICD-10-CM | POA: Diagnosis not present

## 2017-04-01 DIAGNOSIS — I129 Hypertensive chronic kidney disease with stage 1 through stage 4 chronic kidney disease, or unspecified chronic kidney disease: Secondary | ICD-10-CM | POA: Diagnosis present

## 2017-04-01 DIAGNOSIS — D649 Anemia, unspecified: Secondary | ICD-10-CM | POA: Diagnosis not present

## 2017-04-01 DIAGNOSIS — Z90710 Acquired absence of both cervix and uterus: Secondary | ICD-10-CM

## 2017-04-01 DIAGNOSIS — N179 Acute kidney failure, unspecified: Secondary | ICD-10-CM

## 2017-04-01 DIAGNOSIS — Z01818 Encounter for other preprocedural examination: Secondary | ICD-10-CM

## 2017-04-01 DIAGNOSIS — J90 Pleural effusion, not elsewhere classified: Secondary | ICD-10-CM | POA: Diagnosis not present

## 2017-04-01 DIAGNOSIS — T8451XD Infection and inflammatory reaction due to internal right hip prosthesis, subsequent encounter: Secondary | ICD-10-CM | POA: Diagnosis not present

## 2017-04-01 DIAGNOSIS — Z79899 Other long term (current) drug therapy: Secondary | ICD-10-CM | POA: Diagnosis not present

## 2017-04-01 HISTORY — DX: Hyperlipidemia, unspecified: E78.5

## 2017-04-01 HISTORY — DX: Constipation, unspecified: K59.00

## 2017-04-01 HISTORY — DX: Unspecified Escherichia coli (E. coli) as the cause of diseases classified elsewhere: N39.0

## 2017-04-01 HISTORY — DX: Personal history of other diseases of the musculoskeletal system and connective tissue: Z87.39

## 2017-04-01 HISTORY — DX: Urinary tract infection, site not specified: B96.20

## 2017-04-01 HISTORY — DX: Personal history of other specified conditions: Z87.898

## 2017-04-01 HISTORY — DX: Chronic kidney disease, unspecified: N18.9

## 2017-04-01 HISTORY — PX: INCISION AND DRAINAGE HIP: SHX1801

## 2017-04-01 HISTORY — DX: Anemia, unspecified: D64.9

## 2017-04-01 HISTORY — DX: Overactive bladder: N32.81

## 2017-04-01 LAB — SYNOVIAL CELL COUNT + DIFF, W/ CRYSTALS
CRYSTALS FLUID: NONE SEEN
EOSINOPHILS-SYNOVIAL: 0 % (ref 0–1)
Lymphocytes-Synovial Fld: 1 % (ref 0–20)
MONOCYTE-MACROPHAGE-SYNOVIAL FLUID: 10 % — AB (ref 50–90)
Neutrophil, Synovial: 89 % — ABNORMAL HIGH (ref 0–25)
WBC, SYNOVIAL: 57500 /mm3 — AB (ref 0–200)

## 2017-04-01 LAB — HEMOGLOBIN A1C
Hgb A1c MFr Bld: 6.2 % — ABNORMAL HIGH (ref 4.8–5.6)
MEAN PLASMA GLUCOSE: 131.24 mg/dL

## 2017-04-01 LAB — BASIC METABOLIC PANEL
ANION GAP: 12 (ref 5–15)
BUN: 25 mg/dL — ABNORMAL HIGH (ref 6–20)
CHLORIDE: 105 mmol/L (ref 101–111)
CO2: 24 mmol/L (ref 22–32)
CREATININE: 0.92 mg/dL (ref 0.44–1.00)
Calcium: 9.1 mg/dL (ref 8.9–10.3)
GFR calc non Af Amer: 51 mL/min — ABNORMAL LOW (ref >60)
GFR, EST AFRICAN AMERICAN: 59 mL/min — AB (ref >60)
Glucose, Bld: 114 mg/dL — ABNORMAL HIGH (ref 65–99)
POTASSIUM: 3.3 mmol/L — AB (ref 3.5–5.1)
Sodium: 141 mmol/L (ref 135–145)

## 2017-04-01 LAB — HEMOGLOBIN AND HEMATOCRIT, BLOOD
HEMATOCRIT: 24.2 % — AB (ref 36.0–46.0)
HEMOGLOBIN: 7.9 g/dL — AB (ref 12.0–15.0)

## 2017-04-01 LAB — CBC WITH DIFFERENTIAL/PLATELET
BASOS ABS: 0 10*3/uL (ref 0.0–0.1)
BASOS PCT: 0 %
Eosinophils Absolute: 0.1 10*3/uL (ref 0.0–0.7)
Eosinophils Relative: 1 %
HCT: 30.1 % — ABNORMAL LOW (ref 36.0–46.0)
Hemoglobin: 9.6 g/dL — ABNORMAL LOW (ref 12.0–15.0)
Lymphocytes Relative: 35 %
Lymphs Abs: 2.8 10*3/uL (ref 0.7–4.0)
MCH: 27.7 pg (ref 26.0–34.0)
MCHC: 31.9 g/dL (ref 30.0–36.0)
MCV: 87 fL (ref 78.0–100.0)
MONO ABS: 0.6 10*3/uL (ref 0.1–1.0)
Monocytes Relative: 8 %
NEUTROS ABS: 4.6 10*3/uL (ref 1.7–7.7)
NEUTROS PCT: 56 %
Platelets: 272 10*3/uL (ref 150–400)
RBC: 3.46 MIL/uL — AB (ref 3.87–5.11)
RDW: 16.8 % — AB (ref 11.5–15.5)
WBC: 8.1 10*3/uL (ref 4.0–10.5)

## 2017-04-01 LAB — SEDIMENTATION RATE: SED RATE: 70 mm/h — AB (ref 0–22)

## 2017-04-01 LAB — C-REACTIVE PROTEIN: CRP: 4 mg/dL — ABNORMAL HIGH (ref <1.0)

## 2017-04-01 LAB — GLUCOSE, CAPILLARY
GLUCOSE-CAPILLARY: 97 mg/dL (ref 65–99)
Glucose-Capillary: 155 mg/dL — ABNORMAL HIGH (ref 65–99)

## 2017-04-01 SURGERY — IRRIGATION AND DEBRIDEMENT HIP
Anesthesia: General | Site: Hip | Laterality: Right

## 2017-04-01 MED ORDER — HYDROCODONE-ACETAMINOPHEN 5-325 MG PO TABS: 2.0000 | ORAL_TABLET | ORAL | Status: DC | PRN

## 2017-04-01 MED ORDER — METOCLOPRAMIDE HCL 5 MG PO TABS: 5.0000 mg | ORAL_TABLET | Freq: Three times a day (TID) | ORAL | Status: DC | PRN

## 2017-04-01 MED ORDER — FENTANYL CITRATE (PF) 100 MCG/2ML IJ SOLN
INTRAMUSCULAR | Status: DC | PRN
  Administered 2017-04-01 (×3): 25 ug via INTRAVENOUS

## 2017-04-01 MED ORDER — HYDROCODONE-ACETAMINOPHEN 5-325 MG PO TABS
1.0000 | ORAL_TABLET | ORAL | Status: DC | PRN
  Administered 2017-04-01 – 2017-04-07 (×3): 1 via ORAL
  Filled 2017-04-01 (×3): qty 1

## 2017-04-01 MED ORDER — FENTANYL CITRATE (PF) 100 MCG/2ML IJ SOLN: 25.0000 ug | INTRAMUSCULAR | Status: DC | PRN

## 2017-04-01 MED ORDER — HYDROMORPHONE HCL 1 MG/ML IJ SOLN: 0.5000 mg | INTRAMUSCULAR | Status: DC | PRN

## 2017-04-01 MED ORDER — CLINDAMYCIN PHOSPHATE 600 MG/50ML IV SOLN
600.0000 mg | Freq: Four times a day (QID) | INTRAVENOUS | Status: DC
  Administered 2017-04-01 – 2017-04-02 (×3): 600 mg via INTRAVENOUS
  Filled 2017-04-01 (×4): qty 50

## 2017-04-01 MED ORDER — ONDANSETRON HCL 4 MG/2ML IJ SOLN
INTRAMUSCULAR | Status: DC | PRN
  Administered 2017-04-01: 4 mg via INTRAVENOUS

## 2017-04-01 MED ORDER — METOCLOPRAMIDE HCL 5 MG/ML IJ SOLN: 5.0000 mg | Freq: Three times a day (TID) | INTRAMUSCULAR | Status: DC | PRN

## 2017-04-01 MED ORDER — POLYETHYLENE GLYCOL 3350 17 G PO PACK
17.0000 g | PACK | Freq: Every day | ORAL | Status: DC | PRN
  Administered 2017-04-03: 17 g via ORAL
  Filled 2017-04-01: qty 1

## 2017-04-01 MED ORDER — TORSEMIDE 5 MG PO TABS
5.0000 mg | ORAL_TABLET | Freq: Every day | ORAL | Status: DC
  Administered 2017-04-03 – 2017-04-06 (×4): 5 mg via ORAL
  Filled 2017-04-01 (×5): qty 1

## 2017-04-01 MED ORDER — CLINDAMYCIN PHOSPHATE 900 MG/50ML IV SOLN
900.0000 mg | INTRAVENOUS | Status: AC
  Administered 2017-04-01: 900 mg via INTRAVENOUS
  Filled 2017-04-01: qty 50

## 2017-04-01 MED ORDER — DIPHENHYDRAMINE HCL 12.5 MG/5ML PO ELIX: 12.5000 mg | ORAL_SOLUTION | ORAL | Status: DC | PRN

## 2017-04-01 MED ORDER — FAMOTIDINE 20 MG PO TABS
20.0000 mg | ORAL_TABLET | Freq: Every day | ORAL | Status: DC
  Administered 2017-04-02 – 2017-04-07 (×6): 20 mg via ORAL
  Filled 2017-04-01 (×6): qty 1

## 2017-04-01 MED ORDER — FENTANYL CITRATE (PF) 100 MCG/2ML IJ SOLN
25.0000 ug | INTRAMUSCULAR | Status: DC | PRN
  Administered 2017-04-01: 50 ug via INTRAVENOUS

## 2017-04-01 MED ORDER — PROPOFOL 10 MG/ML IV BOLUS
INTRAVENOUS | Status: DC | PRN
  Administered 2017-04-01: 50 mg via INTRAVENOUS

## 2017-04-01 MED ORDER — DOCUSATE SODIUM 100 MG PO CAPS
100.0000 mg | ORAL_CAPSULE | Freq: Two times a day (BID) | ORAL | Status: DC
  Administered 2017-04-02 – 2017-04-07 (×10): 100 mg via ORAL
  Filled 2017-04-01 (×11): qty 1

## 2017-04-01 MED ORDER — PROMETHAZINE HCL 25 MG/ML IJ SOLN: 6.2500 mg | INTRAMUSCULAR | Status: DC | PRN

## 2017-04-01 MED ORDER — ONDANSETRON HCL 4 MG/2ML IJ SOLN
INTRAMUSCULAR | Status: AC
  Filled 2017-04-01: qty 2

## 2017-04-01 MED ORDER — SUGAMMADEX SODIUM 200 MG/2ML IV SOLN
INTRAVENOUS | Status: DC | PRN
  Administered 2017-04-01: 120 mg via INTRAVENOUS

## 2017-04-01 MED ORDER — LORATADINE 10 MG PO TABS
10.0000 mg | ORAL_TABLET | Freq: Every day | ORAL | Status: DC
  Administered 2017-04-02 – 2017-04-07 (×6): 10 mg via ORAL
  Filled 2017-04-01 (×6): qty 1

## 2017-04-01 MED ORDER — SUGAMMADEX SODIUM 200 MG/2ML IV SOLN
INTRAVENOUS | Status: AC
  Filled 2017-04-01: qty 2

## 2017-04-01 MED ORDER — ALLOPURINOL 100 MG PO TABS
200.0000 mg | ORAL_TABLET | Freq: Every day | ORAL | Status: DC
  Administered 2017-04-01 – 2017-04-06 (×6): 200 mg via ORAL
  Filled 2017-04-01 (×7): qty 2

## 2017-04-01 MED ORDER — SODIUM CHLORIDE 0.9 % IV SOLN
INTRAVENOUS | Status: DC | PRN
  Administered 2017-04-01: 500 mL

## 2017-04-01 MED ORDER — CLOPIDOGREL BISULFATE 75 MG PO TABS
75.0000 mg | ORAL_TABLET | Freq: Every day | ORAL | Status: DC
  Administered 2017-04-02 – 2017-04-07 (×6): 75 mg via ORAL
  Filled 2017-04-01 (×6): qty 1

## 2017-04-01 MED ORDER — PHENYLEPHRINE HCL 10 MG/ML IJ SOLN
INTRAVENOUS | Status: DC | PRN
  Administered 2017-04-01: 40 ug/min via INTRAVENOUS

## 2017-04-01 MED ORDER — ROCURONIUM BROMIDE 10 MG/ML (PF) SYRINGE
PREFILLED_SYRINGE | INTRAVENOUS | Status: AC
  Filled 2017-04-01: qty 5

## 2017-04-01 MED ORDER — PHENOL 1.4 % MT LIQD: 1.0000 | OROMUCOSAL | Status: DC | PRN

## 2017-04-01 MED ORDER — MENTHOL 3 MG MT LOZG: 1.0000 | LOZENGE | OROMUCOSAL | Status: DC | PRN

## 2017-04-01 MED ORDER — METHOCARBAMOL 500 MG PO TABS
500.0000 mg | ORAL_TABLET | Freq: Four times a day (QID) | ORAL | Status: DC | PRN
  Administered 2017-04-03 – 2017-04-07 (×4): 500 mg via ORAL
  Filled 2017-04-01 (×4): qty 1

## 2017-04-01 MED ORDER — LIDOCAINE 2% (20 MG/ML) 5 ML SYRINGE
INTRAMUSCULAR | Status: AC
  Filled 2017-04-01: qty 5

## 2017-04-01 MED ORDER — FESOTERODINE FUMARATE ER 4 MG PO TB24
4.0000 mg | ORAL_TABLET | Freq: Every day | ORAL | Status: DC
  Administered 2017-04-02 – 2017-04-07 (×6): 4 mg via ORAL
  Filled 2017-04-01 (×6): qty 1

## 2017-04-01 MED ORDER — ALUM & MAG HYDROXIDE-SIMETH 200-200-20 MG/5ML PO SUSP: 30.0000 mL | ORAL | Status: DC | PRN

## 2017-04-01 MED ORDER — SODIUM CHLORIDE 0.9 % IV SOLN
INTRAVENOUS | Status: DC
  Administered 2017-04-01 – 2017-04-05 (×9): via INTRAVENOUS

## 2017-04-01 MED ORDER — CHLORHEXIDINE GLUCONATE 4 % EX LIQD
60.0000 mL | Freq: Once | CUTANEOUS | Status: AC
  Administered 2017-04-01: 4 via TOPICAL

## 2017-04-01 MED ORDER — ASPIRIN 81 MG PO CHEW
81.0000 mg | CHEWABLE_TABLET | Freq: Two times a day (BID) | ORAL | Status: DC
  Administered 2017-04-02 – 2017-04-07 (×11): 81 mg via ORAL
  Filled 2017-04-01 (×11): qty 1

## 2017-04-01 MED ORDER — FENTANYL CITRATE (PF) 100 MCG/2ML IJ SOLN
INTRAMUSCULAR | Status: AC
  Filled 2017-04-01: qty 2

## 2017-04-01 MED ORDER — LACTATED RINGERS IV SOLN
INTRAVENOUS | Status: DC
  Administered 2017-04-01: 16:00:00 via INTRAVENOUS

## 2017-04-01 MED ORDER — MECLIZINE HCL 25 MG PO TABS: 25.0000 mg | ORAL_TABLET | Freq: Three times a day (TID) | ORAL | Status: DC | PRN

## 2017-04-01 MED ORDER — DEXAMETHASONE SODIUM PHOSPHATE 10 MG/ML IJ SOLN
10.0000 mg | Freq: Once | INTRAMUSCULAR | Status: AC
  Administered 2017-04-02: 10 mg via INTRAVENOUS
  Filled 2017-04-01: qty 1

## 2017-04-01 MED ORDER — ACETAMINOPHEN 325 MG PO TABS
650.0000 mg | ORAL_TABLET | ORAL | Status: DC | PRN
  Administered 2017-04-03 – 2017-04-04 (×4): 650 mg via ORAL
  Filled 2017-04-01 (×4): qty 2

## 2017-04-01 MED ORDER — ROCURONIUM BROMIDE 50 MG/5ML IV SOSY
PREFILLED_SYRINGE | INTRAVENOUS | Status: DC | PRN
  Administered 2017-04-01: 30 mg via INTRAVENOUS

## 2017-04-01 MED ORDER — METHOCARBAMOL 1000 MG/10ML IJ SOLN
500.0000 mg | Freq: Four times a day (QID) | INTRAVENOUS | Status: DC | PRN
  Filled 2017-04-01: qty 5

## 2017-04-01 MED ORDER — ONDANSETRON HCL 4 MG/2ML IJ SOLN: 4.0000 mg | Freq: Four times a day (QID) | INTRAMUSCULAR | Status: DC | PRN

## 2017-04-01 MED ORDER — LIDOCAINE 2% (20 MG/ML) 5 ML SYRINGE
INTRAMUSCULAR | Status: DC | PRN
  Administered 2017-04-01: 60 mg via INTRAVENOUS

## 2017-04-01 MED ORDER — SENNA 8.6 MG PO TABS
2.0000 | ORAL_TABLET | Freq: Every day | ORAL | Status: DC
  Administered 2017-04-02 – 2017-04-06 (×5): 17.2 mg via ORAL
  Filled 2017-04-01 (×5): qty 2

## 2017-04-01 MED ORDER — ACETAMINOPHEN 650 MG RE SUPP: 650.0000 mg | RECTAL | Status: DC | PRN

## 2017-04-01 MED ORDER — SODIUM CHLORIDE 0.9 % IV SOLN
INTRAVENOUS | Status: AC
  Filled 2017-04-01: qty 500000

## 2017-04-01 MED ORDER — VANCOMYCIN HCL IN DEXTROSE 1-5 GM/200ML-% IV SOLN
1000.0000 mg | INTRAVENOUS | Status: AC
  Administered 2017-04-01: 1000 mg via INTRAVENOUS
  Filled 2017-04-01: qty 200

## 2017-04-01 MED ORDER — PHENYLEPHRINE HCL 10 MG/ML IJ SOLN
INTRAMUSCULAR | Status: AC
  Filled 2017-04-01: qty 1

## 2017-04-01 MED ORDER — ATORVASTATIN CALCIUM 10 MG PO TABS
10.0000 mg | ORAL_TABLET | Freq: Every day | ORAL | Status: DC
  Administered 2017-04-02 – 2017-04-06 (×5): 10 mg via ORAL
  Filled 2017-04-01 (×5): qty 1

## 2017-04-01 MED ORDER — ONDANSETRON HCL 4 MG PO TABS: 4.0000 mg | ORAL_TABLET | Freq: Four times a day (QID) | ORAL | Status: DC | PRN

## 2017-04-01 SURGICAL SUPPLY — 53 items
APPLICATOR COTTON TIP 6IN STRL (MISCELLANEOUS) ×3 IMPLANT
BAG ZIPLOCK 12X15 (MISCELLANEOUS) ×3 IMPLANT
CONT SPEC 4OZ CLIKSEAL STRL BL (MISCELLANEOUS) ×12 IMPLANT
COVER SURGICAL LIGHT HANDLE (MISCELLANEOUS) ×3 IMPLANT
DERMABOND ADVANCED (GAUZE/BANDAGES/DRESSINGS) ×2
DERMABOND ADVANCED .7 DNX12 (GAUZE/BANDAGES/DRESSINGS) ×1 IMPLANT
DRAPE ORTHO SPLIT 77X108 STRL (DRAPES) ×4
DRAPE SURG 17X11 SM STRL (DRAPES) ×3 IMPLANT
DRAPE SURG ORHT 6 SPLT 77X108 (DRAPES) ×2 IMPLANT
DRAPE U-SHAPE 47X51 STRL (DRAPES) ×3 IMPLANT
DRSG AQUACEL AG ADV 3.5X10 (GAUZE/BANDAGES/DRESSINGS) IMPLANT
DRSG TEGADERM 4X4.75 (GAUZE/BANDAGES/DRESSINGS) ×3 IMPLANT
DURAPREP 26ML APPLICATOR (WOUND CARE) ×3 IMPLANT
ELECT REM PT RETURN 15FT ADLT (MISCELLANEOUS) ×3 IMPLANT
EVACUATOR 1/8 PVC DRAIN (DRAIN) ×3 IMPLANT
FACESHIELD WRAPAROUND (MASK) ×9 IMPLANT
GAUZE SPONGE 2X2 8PLY STRL LF (GAUZE/BANDAGES/DRESSINGS) ×1 IMPLANT
GAUZE SPONGE 4X4 12PLY STRL (GAUZE/BANDAGES/DRESSINGS) ×3 IMPLANT
GLOVE BIOGEL PI IND STRL 7.5 (GLOVE) ×1 IMPLANT
GLOVE BIOGEL PI IND STRL 8.5 (GLOVE) ×1 IMPLANT
GLOVE BIOGEL PI INDICATOR 7.5 (GLOVE) ×2
GLOVE BIOGEL PI INDICATOR 8.5 (GLOVE) ×2
GLOVE ECLIPSE 8.0 STRL XLNG CF (GLOVE) IMPLANT
GLOVE ECLIPSE 8.5 STRL (GLOVE) ×3 IMPLANT
GLOVE ORTHO TXT STRL SZ7.5 (GLOVE) ×12 IMPLANT
GLOVE SURG ORTHO 8.0 STRL STRW (GLOVE) ×12 IMPLANT
GOWN SPEC L3 XXLG W/TWL (GOWN DISPOSABLE) ×6 IMPLANT
GOWN STRL REUS W/TWL LRG LVL3 (GOWN DISPOSABLE) ×3 IMPLANT
HANDPIECE INTERPULSE COAX TIP (DISPOSABLE) ×2
HEAD FEM UNIPOLAR 42 OD STRL (Hips) ×3 IMPLANT
IV NS IRRIG 3000ML ARTHROMATIC (IV SOLUTION) ×3 IMPLANT
KIT BASIN OR (CUSTOM PROCEDURE TRAY) ×3 IMPLANT
MANIFOLD NEPTUNE II (INSTRUMENTS) ×3 IMPLANT
PACK TOTAL JOINT (CUSTOM PROCEDURE TRAY) ×3 IMPLANT
POSITIONER SURGICAL ARM (MISCELLANEOUS) ×3 IMPLANT
PREVENA INCISION MGT 90 150 (MISCELLANEOUS) ×3 IMPLANT
SET HNDPC FAN SPRY TIP SCT (DISPOSABLE) ×1 IMPLANT
SPACER DEPUY (Hips) ×3 IMPLANT
SPONGE GAUZE 2X2 STER 10/PKG (GAUZE/BANDAGES/DRESSINGS) ×2
STAPLER VISISTAT 35W (STAPLE) ×3 IMPLANT
SUT ETHILON 2 0 PSLX (SUTURE) ×9 IMPLANT
SUT MNCRL AB 4-0 PS2 18 (SUTURE) IMPLANT
SUT MON AB 2-0 CT1 36 (SUTURE) ×3 IMPLANT
SUT STRATAFIX PDO 1 14 VIOLET (SUTURE) ×2
SUT STRATFX PDO 1 14 VIOLET (SUTURE) ×1
SUT VIC AB 1 CT1 36 (SUTURE) ×6 IMPLANT
SUT VIC AB 2-0 CT1 27 (SUTURE) ×4
SUT VIC AB 2-0 CT1 TAPERPNT 27 (SUTURE) ×2 IMPLANT
SUTURE STRATFX PDO 1 14 VIOLET (SUTURE) ×1 IMPLANT
SWAB COLLECTION DEVICE MRSA (MISCELLANEOUS) ×3 IMPLANT
SWAB CULTURE ESWAB REG 1ML (MISCELLANEOUS) ×3 IMPLANT
TOWEL OR 17X26 10 PK STRL BLUE (TOWEL DISPOSABLE) ×6 IMPLANT
YANKAUER SUCT BULB TIP NO VENT (SUCTIONS) ×3 IMPLANT

## 2017-04-01 NOTE — Transfer of Care (Signed)
Immediate Anesthesia Transfer of Care Note  Patient: [name] [name]  Procedure(s) Performed: DEBRIDEMENT OF RIGHT HIP,  HEAD BALL EXCHANGE (Right Hip)  Patient Location: PACU  Anesthesia Type:General  Level of Consciousness: awake, alert , oriented and patient cooperative  Airway & Oxygen Therapy: Patient Spontanous Breathing and Patient connected to face mask oxygen  Post-op Assessment: Report given to RN, Post -op Vital signs reviewed and stable and Patient moving all extremities  Post vital signs: Reviewed and stable  Last Vitals:  Vitals:   04/01/17 1500  BP: 138/61  Pulse: 95  Resp: (!) 22  Temp: 36.6 C  SpO2: 97%    Last Pain:  Vitals:   04/01/17 1500  TempSrc: Oral         Complications: No apparent anesthesia complications

## 2017-04-01 NOTE — Op Note (Signed)
OPERATIVE REPORT   04/01/2017  8:14 PM  PATIENT:  [name] [name]   SURGEON:  Jonette Pesa, MD  ASSISTANT: Staff.   PREOPERATIVE DIAGNOSIS: Periprosthetic joint infection, right hip hemiarthroplasty.  POSTOPERATIVE DIAGNOSIS:  Same.  PROCEDURE:  1.  Debridement of right hip with head ball exchange. 2.  Application of incisional wound VAC.  ANESTHESIA:   GETA.  ANTIBIOTICS: 900 mg clindamycin. 1 g vancomycin.  IMPLANTS: DePuy 42 mm fracture head hip ball with -3 mm spacer.  EXPLANTS: DePuy 42 mm fracture head hip ball with -3 mm spacer.  SPECIMENS: Right hip synovial fluid for cell count with differential. Right hip synovial fluid for culture. Superficial fatty tissue for tissue culture. Right hip pseudocapsule for tissue culture x2.  COMPLICATIONS: None.  DISPOSITION: Stable to PACU.  SURGICAL INDICATIONS:  [name] [name] is a 84 y.o. female who underwent right hip hemiarthroplasty on 02/15/2017.  She presented to the office 2 days ago with wound dehiscence and purulent drainage.  In the office, I aspirated her joint and the cell count was 54,000 white blood cells with 94% neutrophils.  Alpha defense and was positive.  Gram stain was negative.  Culture is still pending.  We discussed debridement of the right hip and head ball exchange in an attempt to salvage her implants.  The risks, benefits, and alternatives were discussed with the patient preoperatively including but not limited to the risks of infection, bleeding, nerve / blood vessel injury, cardiopulmonary complications, the need for repeat surgery, among others, and the patient was willing to proceed.  PROCEDURE IN DETAIL: Identified the patient in the holding area using 2 identifiers.  She was taken to the operating room and general anesthesia was induced on her bed.  She was then transferred to the [facility] table.  The right hip was prepped and draped in the normal sterile surgical fashion.  Timeout was called,  verifying site and site of surgery.  She did receive preop IV antibiotics within 60 minutes of beginning the procedure.  I began by examining her right hip.  She had a 4 cm area in the center of her incision with wound dehiscence.  She had necrotic skin edge and necrotic fat underneath.  Using a #10 blade I sharply excised the skin edges of the dehisced area.  I opened up her incision both proximally and distally.  I created full-thickness skin flaps.  She did have necrotic fatty tissue which I excisionally debrided with a rongeur.  The wound did track down to the suture line in the center of the incision.  This finding coupled with the results of her cell count indicated that she likely had a periprosthetic joint infection.  I copiously irrigated the superficial tissues with 3 L of saline using pulse lavage.  A representative sample of necrotic fat was sent for a tissue culture.  I then used a #10 blade to enter the fascia.  The previously utilized Heuter interval was entered.  Inside the joint, there was no significant inflammatory tissue.  The joint itself looked benign.  I applied traction and used a bone tamp to disengage the head ball from the trunnion.  The hip was then dislocated.  I remove the head ball and this was sent off the table.  I performed a radical capsulectomy of the hip.  2 representative samples of pseudocapsule were sent for tissue culture.  Antibiotic solution was irrigated into the hip.  I irrigated the deep hip tissues with 3 L of  saline using pulse lavage.  I then turned my attention to placing a new head ball and taper.  There was no trunnion damage.  After the head ball was applied, the hip was then reduced.  Soft tissue tension was appropriate.  The hip was stable.  I closed the fascia with #1 PDS and #1 strata fix suture over a medium Hemovac drain, which I brought out in line with the incision distally.  Deep dermal layer was closed with 2-0 Monocryl suture.  Skin was closed  with 2-0 nylon suture utilizing a vertical mattress.  The wound closed without any undue tension.  I then applied a Prevena incisional wound VAC according to the manufacturer's instructions.  The patient was then extubated, and taken to the PACU in stable condition.  Sponge, needle, and instrument counts were correct at the end of the case x2.  There were no known complications.  POSTOPERATIVE PLAN: Postoperatively, the patient will be admitted to the hospital.  She may weight-bear as tolerated with a walker.  We will resume aspirin and Plavix for DVT prophylaxis.  We will await her intraoperative cell count and cultures, which were not available at the time of the dictation.  For now, she will be placed on clindamycin and vancomycin until the cultures are available.  She will likely need a PICC line and prolonged IV antibiotics.  She will work with physical and occupational therapy, and undergo disposition planning.

## 2017-04-01 NOTE — Anesthesia Procedure Notes (Signed)
Procedure Name: Intubation Date/Time: 04/01/2017 5:13 PM Performed by: Montel Clock, CRNA Pre-anesthesia Checklist: Patient identified, Emergency Drugs available, Suction available, Patient being monitored and Timeout performed Patient Re-evaluated:Patient Re-evaluated prior to induction Oxygen Delivery Method: Circle system utilized Preoxygenation: Pre-oxygenation with 100% oxygen Induction Type: IV induction Ventilation: Mask ventilation without difficulty and Oral airway inserted - appropriate to patient size Laryngoscope Size: Mac and 3 Grade View: Grade I Tube type: Oral Tube size: 7.0 mm Number of attempts: 1 Airway Equipment and Method: Stylet Placement Confirmation: ETT inserted through vocal cords under direct vision,  positive ETCO2 and breath sounds checked- equal and bilateral Secured at: 21 cm Tube secured with: Tape Dental Injury: Teeth and Oropharynx as per pre-operative assessment

## 2017-04-01 NOTE — Anesthesia Postprocedure Evaluation (Signed)
Anesthesia Post Note  Patient: [name] [name]  Procedure(s) Performed: DEBRIDEMENT OF RIGHT HIP,  HEAD BALL EXCHANGE (Right Hip)     Patient location during evaluation: PACU Anesthesia Type: General Level of consciousness: awake and alert Pain management: pain level controlled Vital Signs Assessment: post-procedure vital signs reviewed and stable Respiratory status: spontaneous breathing, nonlabored ventilation, respiratory function stable and patient connected to nasal cannula oxygen Cardiovascular status: blood pressure returned to baseline and stable Postop Assessment: no apparent nausea or vomiting Anesthetic complications: no    Last Vitals:  Vitals:   04/01/17 2115 04/01/17 2125  BP: 120/60 (!) 108/53  Pulse: 95 94  Resp: 16 10  Temp:  36.4 C  SpO2: 100% 100%    Last Pain:  Vitals:   04/01/17 2055  TempSrc:   PainSc: Asleep                 Keatyn Jawad S

## 2017-04-01 NOTE — H&P (Signed)
PREOPERATIVE H&P  Chief Complaint: Wound dehiscence right hip  HPI: [name] [name] is a 84 y.o. female who presents for preoperative history and physical with a diagnosis of Wound dehiscence right hip.  She underwent right hip hemiarthroplasty on 02/15/2017 for displaced femoral neck fracture.  She was found to have very poor bone quality.  She presented to the office 2 days ago for the first time postoperatively, and was found to have a wound dehiscence.  I aspirated her hip in the office in the synovial white blood cell count was 54,000 with 94% neutrophils.  Alpha defensin positive.  Gram stain negative.  Culture pending.  We discussed debridement of the right hip wound with possible head ball exchange.  She has elected for surgical management.   Past Medical History:  Diagnosis Date  . Anemia    blood transfusion during hospitalization of 02/15/2017   . Arthritis   . Chronic kidney disease    CKD- stage III  . Constipation   . Diabetes mellitus type II 02/19/2011   diet controlled   . Dizziness   . E. coli urinary tract infection   . Gout   . Gout spondylitis   . 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 History of dizziness   . History of gout   . Hyperlipidemia   . Hypertension   . Overactive bladder   . Sciatica   . Stroke [facility](HCC)    Past Surgical History:  Procedure Laterality Date  . ABDOMINAL HYSTERECTOMY    . HIP PINNING,CANNULATED Right 02/15/2017   Procedure: ANTERIOR APPROACH HEMI HIP ARTHROPLASTY;  Surgeon: Samson FredericSwinteck, Hasel Janish, MD;  Location: [facility] OR;  Service: Orthopedics;  Laterality: Right;   Social History   Socioeconomic History  . Marital status: Widowed    Spouse name: None  . Number of children: None  . Years of education: None  . Highest education level: None  Social Needs  . Financial resource strain: None  . Food insecurity - worry: None  . Food insecurity - inability: None  . Transportation needs - medical: None  .  Transportation needs - non-medical: None  Occupational History  . None  Tobacco Use  . Smoking status: Never Smoker  . Smokeless tobacco: Never Used  Substance and Sexual Activity  . Alcohol use: No  . Drug use: No  . Sexual activity: Not Currently  Other Topics Concern  . None  Social History Narrative  . None   Family History  Problem Relation Age of Onset  . Stroke Mother   . Stroke Father    Allergies  Allergen Reactions  . Bee Venom Swelling  . Penicillins Other (See Comments)    Has patient had a PCN reaction causing immediate rash, facial/tongue/throat swelling, SOB or lightheadedness with hypotension: Unknown Has patient had a PCN reaction causing severe rash involving mucus membranes or skin necrosis: Unknown Has patient had a PCN reaction that required hospitalization: Unknown Has patient had a PCN reaction occurring within the last 10 years: Unknown If all of the above answers are "NO", then may proceed with Cephalosporin use.    Prior to Admission medications   Medication Sig Start Date End Date Taking? Authorizing Provider  allopurinol (ZYLOPRIM) 100 MG tablet Take 200 mg by mouth at bedtime.    Yes [provider]  atorvastatin (LIPITOR) 10 MG tablet Take 10 mg by mouth at bedtime.  12/22/16  Yes [provider]  Calcium-Magnesium-Vitamin D (CALCIUM 500 PO) Take 500  mg by mouth daily.   Yes [provider]  Cholecalciferol (VITAMIN D3) 1000 units CAPS Take 4,000 Units by mouth daily.   Yes [provider]  clopidogrel (PLAVIX) 75 MG tablet Take 75 mg by mouth daily.  06/08/14  Yes [provider]  doxycycline (VIBRAMYCIN) 100 MG capsule Take 100 mg by mouth 2 (two) times daily.   Yes [provider]  famotidine (PEPCID) 20 MG tablet Take 20 mg by mouth daily.  07/06/14  Yes [provider]  fesoterodine (TOVIAZ) 4 MG TB24 tablet Take 1 tablet (4 mg total) by mouth daily. 03/02/17  Yes Angiulli, Mcarthur Rossetti,  PA-C  loratadine (CLARITIN) 10 MG tablet Take 10 mg by mouth daily.   Yes [provider]  meclizine (ANTIVERT) 25 MG tablet Take 25 mg by mouth 3 (three) times daily as needed for dizziness.    Yes [provider]  senna-docusate (SENOKOT-S) 8.6-50 MG tablet Take 2 tablets by mouth 2 (two) times daily. 03/02/17  Yes Angiulli, Mcarthur Rossetti, PA-C  torsemide (DEMADEX) 10 MG tablet Take 1 tablet (10 mg total) by mouth daily as needed. Patient taking differently: Take 5 mg by mouth daily.  02/18/17  Yes Rolly Salter, MD  traMADol (ULTRAM) 50 MG tablet Take 1 tablet (50 mg total) by mouth every 6 (six) hours as needed for moderate pain. Patient taking differently: Take 50 mg by mouth See admin instructions. Take 50 mg by mouth at bedtime. Take 50 mg by mouth every 6 hours as needed for pain 03/02/17  Yes Angiulli, Mcarthur Rossetti, PA-C  aspirin 81 MG chewable tablet Chew 1 tablet (81 mg total) by mouth 2 (two) times daily with a meal. Patient not taking: Reported on 03/31/2017 02/17/17   Samson Frederic, MD  diclofenac sodium (VOLTAREN) 1 % GEL Apply 2 g topically 4 (four) times daily. 03/02/17   Angiulli, Mcarthur Rossetti, PA-C     Positive ROS: All other systems have been reviewed and were otherwise negative with the exception of those mentioned in the HPI and as above.  Physical Exam: General: Alert, no acute distress Cardiovascular: No pedal edema Respiratory: No cyanosis, no use of accessory musculature GI: No organomegaly, abdomen is soft and non-tender Skin: No lesions in the area of chief complaint Neurologic: Sensation intact distally Psychiatric: Patient is competent for consent with normal mood and affect Lymphatic: No axillary or cervical lymphadenopathy  MUSCULOSKELETAL: Examination of the right hip reveals that in the center of the incision, she has approximately 4 cm area where the wound is dehisced.  I am unable to probe deep past the fascia.  She does have some necrotic skin and fatty  tissue.  She is neurovascularly intact distally.  Assessment: Wound dehiscence right hip  Plan: Plan for Procedure(s): DEBRIDEMENT OF RIGHT HIP, POSSIBLE HEAD BALL EXCHANGE  The risks benefits and alternatives were discussed with the patient including but not limited to the risks of nonoperative treatment, versus surgical intervention including infection, bleeding, nerve injury,  blood clots, cardiopulmonary complications, morbidity, mortality, among others, and they were willing to proceed.   Jonette Pesa, MD Cell [phone]   04/01/2017 4:50 PM

## 2017-04-01 NOTE — Anesthesia Preprocedure Evaluation (Signed)
Anesthesia Evaluation  Patient identified by MRN, date of birth, ID band Patient awake    Reviewed: Allergy & Precautions, NPO status , Patient's Chart, lab work & pertinent test results  Airway Mallampati: II  TM Distance: >3 FB Neck ROM: Full    Dental no notable dental hx.    Pulmonary neg pulmonary ROS,    Pulmonary exam normal breath sounds clear to auscultation       Cardiovascular hypertension, Normal cardiovascular exam Rhythm:Regular Rate:Normal     Neuro/Psych CVA negative psych ROS   GI/Hepatic negative GI ROS, Neg liver ROS,   Endo/Other  diabetes  Renal/GU Renal InsufficiencyRenal disease  negative genitourinary   Musculoskeletal negative musculoskeletal ROS (+)   Abdominal   Peds negative pediatric ROS (+)  Hematology negative hematology ROS (+)   Anesthesia Other Findings   Reproductive/Obstetrics negative OB ROS                             Anesthesia Physical Anesthesia Plan  ASA: IV  Anesthesia Plan: General   Post-op Pain Management:    Induction: Intravenous  PONV Risk Score and Plan: 3 and Ondansetron and Treatment may vary due to age or medical condition  Airway Management Planned: Oral ETT  Additional Equipment:   Intra-op Plan:   Post-operative Plan: Extubation in OR  Informed Consent: I have reviewed the patients History and Physical, chart, labs and discussed the procedure including the risks, benefits and alternatives for the proposed anesthesia with the patient or authorized representative who has indicated his/her understanding and acceptance.   Dental advisory given  Plan Discussed with: CRNA and Surgeon  Anesthesia Plan Comments:         Anesthesia Quick Evaluation

## 2017-04-02 ENCOUNTER — Encounter (HOSPITAL_COMMUNITY): Payer: Self-pay | Admitting: Orthopedic Surgery

## 2017-04-02 ENCOUNTER — Other Ambulatory Visit: Payer: Self-pay

## 2017-04-02 DIAGNOSIS — M009 Pyogenic arthritis, unspecified: Secondary | ICD-10-CM

## 2017-04-02 DIAGNOSIS — Z96641 Presence of right artificial hip joint: Secondary | ICD-10-CM | POA: Diagnosis present

## 2017-04-02 DIAGNOSIS — Z91030 Bee allergy status: Secondary | ICD-10-CM

## 2017-04-02 DIAGNOSIS — T8451XA Infection and inflammatory reaction due to internal right hip prosthesis, initial encounter: Principal | ICD-10-CM

## 2017-04-02 DIAGNOSIS — Z880 Allergy status to penicillin: Secondary | ICD-10-CM

## 2017-04-02 DIAGNOSIS — Z8781 Personal history of (healed) traumatic fracture: Secondary | ICD-10-CM

## 2017-04-02 LAB — CBC
HEMATOCRIT: 24 % — AB (ref 36.0–46.0)
Hemoglobin: 7.8 g/dL — ABNORMAL LOW (ref 12.0–15.0)
MCH: 28.6 pg (ref 26.0–34.0)
MCHC: 32.5 g/dL (ref 30.0–36.0)
MCV: 87.9 fL (ref 78.0–100.0)
Platelets: 216 10*3/uL (ref 150–400)
RBC: 2.73 MIL/uL — ABNORMAL LOW (ref 3.87–5.11)
RDW: 17.1 % — ABNORMAL HIGH (ref 11.5–15.5)
WBC: 8.7 10*3/uL (ref 4.0–10.5)

## 2017-04-02 LAB — BASIC METABOLIC PANEL
Anion gap: 11 (ref 5–15)
BUN: 26 mg/dL — AB (ref 6–20)
CO2: 25 mmol/L (ref 22–32)
CREATININE: 1.45 mg/dL — AB (ref 0.44–1.00)
Calcium: 8.5 mg/dL — ABNORMAL LOW (ref 8.9–10.3)
Chloride: 104 mmol/L (ref 101–111)
GFR calc Af Amer: 34 mL/min — ABNORMAL LOW (ref >60)
GFR calc non Af Amer: 29 mL/min — ABNORMAL LOW (ref >60)
Glucose, Bld: 137 mg/dL — ABNORMAL HIGH (ref 65–99)
POTASSIUM: 4.1 mmol/L (ref 3.5–5.1)
Sodium: 140 mmol/L (ref 135–145)

## 2017-04-02 LAB — ABO/RH: ABO/RH(D): O POS

## 2017-04-02 LAB — PREPARE RBC (CROSSMATCH)

## 2017-04-02 MED ORDER — LIDOCAINE HCL (PF) 1 % IJ SOLN
9.0000 mL | Freq: Once | INTRAMUSCULAR | Status: AC
  Administered 2017-04-02: 16:00:00 9 mL
  Filled 2017-04-02: qty 10

## 2017-04-02 MED ORDER — METHYLPREDNISOLONE ACETATE 80 MG/ML IJ SUSP
80.0000 mg | Freq: Once | INTRAMUSCULAR | Status: AC
  Administered 2017-04-02: 80 mg via INTRA_ARTICULAR
  Filled 2017-04-02: qty 1

## 2017-04-02 MED ORDER — RIFAMPIN 300 MG PO CAPS
300.0000 mg | ORAL_CAPSULE | Freq: Two times a day (BID) | ORAL | Status: DC
  Administered 2017-04-02 – 2017-04-03 (×3): 300 mg via ORAL
  Filled 2017-04-02 (×4): qty 1

## 2017-04-02 MED ORDER — LEVOFLOXACIN 500 MG PO TABS
250.0000 mg | ORAL_TABLET | Freq: Every day | ORAL | Status: DC
  Administered 2017-04-02 – 2017-04-03 (×2): 250 mg via ORAL
  Filled 2017-04-02 (×2): qty 1

## 2017-04-02 MED ORDER — SODIUM CHLORIDE 0.9 % IV SOLN
Freq: Once | INTRAVENOUS | Status: AC
  Administered 2017-04-02: 14:00:00 via INTRAVENOUS

## 2017-04-02 NOTE — Progress Notes (Signed)
Patient has a long-standing history of intermittent right knee pain due to end-stage osteoarthritis with deformity.  She has had right knee pain since the time of her hip fracture.  On exam, there is no effusion.  She does have an angular deformity with swelling.  She has pain with range of motion of the knee.  She has limited range of motion.  Patient desires cortisone injection in an attempt to relieve pain.  After verbal consent was obtained, a mixture of 1 cc of 80 mg/cc Depo-Medrol +9 cc of 1% plain lidocaine was injected into the suprapatellar pouch of the right knee using aseptic technique.  Sterile dressing was applied.  She tolerated this well.

## 2017-04-02 NOTE — Progress Notes (Addendum)
Subjective:  Patient reports pain as mild to moderate.  Denies N/V/CP/SOB.   Objective:   VITALS:   Vitals:   04/01/17 2356 04/02/17 0124 04/02/17 0452 04/02/17 1014  BP: (!) 110/48 (!) 117/52 (!) 105/40 (!) 116/48  Pulse: 89 87 98 69  Resp: 15 15 16 15   Temp: 97.8 F (36.6 C) 97.7 F (36.5 C) (!) 97.5 F (36.4 C) 97.6 F (36.4 C)  TempSrc: Axillary Axillary Oral Axillary  SpO2: 98% 100% 95% 97%  Weight:      Height:        NAD ABD soft Sensation intact distally Intact pulses distally Dorsiflexion/Plantar flexion intact Incision: dressing C/D/I Compartment soft HV ss prevena intact  Lab Results  Component Value Date   WBC 8.7 04/02/2017   HGB 7.8 (L) 04/02/2017   HCT 24.0 (L) 04/02/2017   MCV 87.9 04/02/2017   PLT 216 04/02/2017   BMET    Component Value Date/Time   NA 140 04/02/2017 0534   K 4.1 04/02/2017 0534   CL 104 04/02/2017 0534   CO2 25 04/02/2017 0534   GLUCOSE 137 (H) 04/02/2017 0534   BUN 26 (H) 04/02/2017 0534   CREATININE 1.45 (H) 04/02/2017 0534   CALCIUM 8.5 (L) 04/02/2017 0534   GFRNONAA 29 (L) 04/02/2017 0534   GFRAA 34 (L) 04/02/2017 0534    Results for orders placed or performed during the hospital encounter of 04/01/17  Aerobic Culture (superficial specimen)     Status: None (Preliminary result)   Collection Time: 04/01/17  5:51 PM  Result Value Ref Range Status   Specimen Description   Final    TISSUE FATTY Performed at [facility], [address] [place], [place] [address]    Special Requests   Final    NONE Performed at [facility], [address], [place], [place] [address]    Gram Stain NO WBC SEEN NO ORGANISMS SEEN   Final   Culture   Final    CULTURE REINCUBATED FOR BETTER GROWTH Performed at [facility], [address] [place], [place] [address]    Report Status PENDING  Incomplete  Aerobic/Anaerobic Culture (surgical/deep wound)     Status: None  (Preliminary result)   Collection Time: 04/01/17  6:10 PM  Result Value Ref Range Status   Specimen Description   Final    SYNOVIAL JOINT Performed at [facility], [address] [place], [place] [address]    Special Requests   Final    NONE Performed at [facility], [address] [place], [place] [address]    Gram Stain   Final    ABUNDANT WBC PRESENT,BOTH PMN AND MONONUCLEAR NO ORGANISMS SEEN    Culture   Final    NO GROWTH 1 DAY Performed at [facility], [address], [place], [place] [address]    Report Status PENDING  Incomplete  Aerobic/Anaerobic Culture (surgical/deep wound)     Status: None (Preliminary result)   Collection Time: 04/01/17  6:32 PM  Result Value Ref Range Status   Specimen Description   Final    HIP RIGHT PSEUDO CAPSULE Performed at [facility], [address], [place], [place] [address]    Special Requests   Final    NONE Performed at [facility], [address] [place], [place] [address]    Gram Stain   Final    RARE WBC PRESENT, PREDOMINANTLY MONONUCLEAR  NO ORGANISMS SEEN Performed at [facility], [address] [place], [place] [address]    Culture PENDING  Incomplete   Report Status PENDING  Incomplete  Aerobic/Anaerobic Culture (surgical/deep wound)     Status: None (Preliminary result)   Collection Time: 04/01/17  6:33 PM  Result Value Ref Range Status   Specimen Description   Final    HIP RIGHT PSEUDO CAPSULE 2 Performed at [facility], [address] [place], [place] [address]    Special Requests   Final    NONE Performed at [facility], [address] [place], [place] [address]    Gram Stain   Final    RARE WBC PRESENT, PREDOMINANTLY MONONUCLEAR NO ORGANISMS SEEN Performed at [facility], [address] [place], [place] [address]    Culture PENDING  Incomplete   Report Status  PENDING  Incomplete     Assessment/Plan: 1 Day Post-Op   Active Problems:   Postoperative wound dehiscence   WBAT with walker DVT ppx: ASA + plavix, SCDs, TEDS PO pain control PT/OT ABLA: symptomatic with hypotn, transfuse 2 units PRBCs CKDIII: mild elevation in Cr, cont IVFs, monitor R hip PJI: cont IV vanc and clinda for now Dispo: follow cultures, place PICC, cont HV drain   Iline OvenBrian J Madicyn Mesina 04/02/2017, 1:05 PM   Samson FredericBrian Kaede Clendenen, MD Cell [phone]

## 2017-04-02 NOTE — Progress Notes (Signed)
From SNF, plan for d/c to SNF, discharge planning per CSW. [phone]

## 2017-04-02 NOTE — Progress Notes (Signed)
Pharmacy Antibiotic Note  [name] [name] is a 84 y.o. female admitted on 04/01/2017 with Wound Infection.  Pharmacy has been consulted for Vancomycin dosing.  Plan: Vancomycin 1gm iv x1 at 1619 2/14.  Calculator gives 500mg  iv q48hr, which still gives AUC above goal. Goal AUC = 400 - 500 for all indications, except meningitis (goal AUC > 500 and Cmin 15-20 mcg/mL)   Therefore will dose per levels, redose with 500mg  when VR < 20.   Height: 4\' 10"  (147.3 cm) Weight: 125 lb (56.7 kg) IBW/kg (Calculated) : 40.9  Temp (24hrs), Avg:97.8 F (36.6 C), Min:97.5 F (36.4 C), Max:98.5 F (36.9 C)  Recent Labs  Lab 04/01/17 1455 04/02/17 0534  WBC 8.1 8.7  CREATININE 0.92 1.45*    Estimated Creatinine Clearance: 16.5 mL/min (A) (by C-G formula based on SCr of 1.45 mg/dL (H)).    Allergies  Allergen Reactions  . Bee Venom Swelling  . Penicillins Other (See Comments)    Has patient had a PCN reaction causing immediate rash, facial/tongue/throat swelling, SOB or lightheadedness with hypotension: Unknown Has patient had a PCN reaction causing severe rash involving mucus membranes or skin necrosis: Unknown Has patient had a PCN reaction that required hospitalization: Unknown Has patient had a PCN reaction occurring within the last 10 years: Unknown If all of the above answers are "NO", then may proceed with Cephalosporin use.    Antimicrobials this admission:  Vancomycin 04/01/2017 >> Clindamycin 04/01/2017 >>   Dose adjustments this admission:  VR 2/17 at 0500: __________  Microbiology results: pending  Thank you for allowing pharmacy to be a part of this patient's care.  Aleene DavidsonGrimsley Jr, Khamani Daniely Crowford 04/02/2017 7:18 AM

## 2017-04-02 NOTE — Clinical Social Work Note (Signed)
Clinical Social Work Assessment  Patient Details  Name: [name] [name] MRN: [id number] Date of Birth: 03-Feb-1918  Date of referral:  04/02/17               Reason for consult:  Facility Placement                Permission sought to share information with:  Family Supports Permission granted to share information::     Name::        Agency::  [facility]  Relationship::  Son   Contact Information:     Housing/Transportation Living arrangements for the past 2 months:  Skilled Nursing Facility, Single Family Home Source of Information:  Adult Children Patient Interpreter Needed:  None Criminal Activity/Legal Involvement Pertinent to Current Situation/Hospitalization:  No - Comment as needed Significant Relationships:  Adult Children Lives with:  Self([facility]) Do you feel safe going back to the place where you live?  Yes Need for family participation in patient care:  Yes (Dependent with mobility/ Confusion)  Care giving concerns:   Patient admitted for wound dehiscence right hip.   Social Worker assessment / plan:  Patient alert to self. CSW spoke to patient son [name] [name] [facility](POA). He reports the [facility] for short rehab after a fall at home and hip hemiarthroplasty. He reports the patient was living at home alone prior to her fall.  CSW spoke with admission director [name] CitizenCaroline, sheis  prepared to accept patient with wound vac and IV antibiotics.   Employment status:  Retired Health and safety inspectornsurance information:  Medicare PT Recommendations:  Not assessed at this time, Skilled Nursing Facility Information / Referral to community resources:  Skilled Nursing Facility  Patient/Family's Response to care:  Patient son agreeable for patient to return to SNF for wound and physical therapy.   Patient/Family's Understanding of and Emotional Response to Diagnosis, Current Treatment, and  Prognosis:  Patient son is POA, he request updates from medical staff.   Emotional Assessment Appearance:  Appears stated age Attitude/Demeanor/Rapport:    Affect (typically observed):  Unable to Assess Orientation:   Self Alcohol / Substance use:  Not Applicable Psych involvement (Current and /or in the community):  No (Comment)  Discharge Needs  Concerns to be addressed:  Discharge Planning Concerns Readmission within the last 30 days:  No Current discharge risk:  Dependent with Mobility Barriers to Discharge:  Continued Medical Work up, Insurance Authorization   Clearance Cootsicole A Brittain Hosie, LCSW 04/02/2017, 1:43 PM

## 2017-04-02 NOTE — Consult Note (Addendum)
[facility]    Date of Admission:  04/01/2017           Day 2 vancomycin        Day 2 clindamycin        Day 2 rifampin       Reason for Consult: Acute infection of right hip hemiarthroplasty    Referring Provider: Dr. Samson FredericBrian Swinteck  Assessment: She has developed postoperative right hip wound dehiscence and probable infection of her hemiarthroplasty. I will continue vancomycin and rifampin for now to cover staph aureus.  I will change clindamycin to levofloxacin (she has a penicillin allergy of unknown type and severity) for better gram-negative rod coverage pending final cultures.  I agree with PICC placement.  She will need to 6 weeks of an IV-based regimen for conversion to oral antibiotics in an attempt to salvage the hemiarthroplasty.  I will have my partner, Dr. Daiva EvesVan Dam [phone]) check final cultures this weekend and make adjustments in antibiotic therapy as indicated.  I will follow-up on Monday, 04/05/2017.  Plan: 1. Continue vancomycin and rifampin 2. Change clindamycin to levofloxacin 3. 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 allopurinol  200 mg Oral QHS  . aspirin  81 mg Oral BID  . atorvastatin  10 mg Oral QHS  . clopidogrel  75 mg Oral Daily  . docusate sodium  100 mg Oral BID  . famotidine  20 mg Oral Daily  . fesoterodine  4 mg Oral Daily  . loratadine  10 mg Oral Daily  . rifampin  300 mg Oral Q12H  . senna  2 tablet Oral QHS  . torsemide  5 mg Oral Daily   Continuous Infusions: . sodium chloride 150 mL/hr at 04/02/17 0818  . clindamycin (CLEOCIN) IV Stopped (04/02/17 1346)  . methocarbamol (ROBAXIN)  IV     PRN Meds:.acetaminophen **OR** acetaminophen, alum & mag hydroxide-simeth,  diphenhydrAMINE, HYDROcodone-acetaminophen, HYDROcodone-acetaminophen, HYDROmorphone (DILAUDID) injection, meclizine, menthol-cetylpyridinium **OR** phenol, methocarbamol **OR** methocarbamol (ROBAXIN)  IV, metoCLOPramide **OR** metoCLOPramide (REGLAN) injection, ondansetron **OR** ondansetron (ZOFRAN) IV, polyethylene glycol  HPI: [name] [name] is a 84 y.o. female who fell and suffered a displaced right femoral neck fracture on New Year's Eve.  She underwent right hip hemiarthroplasty and was discharged to a skilled nursing facility on 03/02/2017.  Dr. Linna CapriceSwinteck saw her back in the office on 03/30/2017 and discovered that she had had wound dehiscence.  He aspirated her hip and synovial fluid showed 54,000 white blood cells with 94% segmented neutrophils.  No organisms were seen on Gram stain.  She was admitted yesterday and underwent incision and drainage and head ball exchange.  No organisms were seen on operative Gram stain.  Cultures are pending   Review of Systems: Review of Systems  Constitutional: Negative for chills, diaphoresis and fever.  Gastrointestinal: Negative for abdominal pain, diarrhea, nausea and vomiting.  Musculoskeletal: Positive for joint pain.    Past Medical History:  Diagnosis Date  . Anemia    blood transfusion during hospitalization of 02/15/2017   . Arthritis   . Chronic kidney disease    CKD- stage III  . Constipation   . Diabetes mellitus type II 02/19/2011   diet controlled   .  Dizziness   . E. coli urinary tract infection   . Gout   . Gout spondylitis   . 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 History of dizziness   . History of gout   . Hyperlipidemia   . Hypertension   . Overactive bladder   . Sciatica   . Stroke [facility])     Social History   Tobacco Use  . Smoking status: Never Smoker  . Smokeless tobacco: Never Used  Substance Use Topics  . Alcohol use: No  . Drug use: No    Family History  Problem  Relation Age of Onset  . Stroke Mother   . Stroke Father    Allergies  Allergen Reactions  . Bee Venom Swelling  . Penicillins Other (See Comments)    Has patient had a PCN reaction causing immediate rash, facial/tongue/throat swelling, SOB or lightheadedness with hypotension: Unknown Has patient had a PCN reaction causing severe rash involving mucus membranes or skin necrosis: Unknown Has patient had a PCN reaction that required hospitalization: Unknown Has patient had a PCN reaction occurring within the last 10 years: Unknown If all of the above answers are "NO", then may proceed with Cephalosporin use.     OBJECTIVE: Blood pressure (!) 105/45, pulse 82, temperature (!) 97.5 F (36.4 C), temperature source Oral, resp. rate 18, height 4\' 10"  (1.473 m), weight 125 lb (56.7 kg), SpO2 100 %.  Physical Exam  Constitutional: She is oriented to person, place, and time.  She is up in her room while walking to the bathroom.  Neurological: She is alert and oriented to person, place, and time.  Psychiatric: Mood and affect normal.    Lab Results Lab Results  Component Value Date   WBC 8.7 04/02/2017   HGB 7.8 (L) 04/02/2017   HCT 24.0 (L) 04/02/2017   MCV 87.9 04/02/2017   PLT 216 04/02/2017    Lab Results  Component Value Date   CREATININE 1.45 (H) 04/02/2017   BUN 26 (H) 04/02/2017   NA 140 04/02/2017   K 4.1 04/02/2017   CL 104 04/02/2017   CO2 25 04/02/2017    Lab Results  Component Value Date   ALT 74 (H) 02/19/2017   AST 77 (H) 02/19/2017   ALKPHOS 83 02/19/2017   BILITOT 1.3 (H) 02/19/2017     Microbiology: Recent Results (from the past 240 hour(s))  Aerobic Culture (superficial specimen)     Status: None (Preliminary result)   Collection Time: 04/01/17  5:51 PM  Result Value Ref Range Status   Specimen Description   Final    TISSUE FATTY Performed at [facility], [address], [place], [place] [address]    Special Requests   Final     NONE Performed at [facility], [address] [place], [place] [address]    Gram Stain NO WBC SEEN NO ORGANISMS SEEN   Final   Culture   Final    CULTURE REINCUBATED FOR BETTER GROWTH Performed at [facility], [address] [place], [place] [address]    Report Status PENDING  Incomplete  Aerobic/Anaerobic Culture (surgical/deep wound)     Status: None (Preliminary result)   Collection Time: 04/01/17  6:10 PM  Result Value Ref Range Status   Specimen Description   Final    SYNOVIAL JOINT Performed at [facility], [address] [place], [place] [address]    Special Requests   Final  NONE Performed at [facility], [address] [place], [place] [address]    Gram Stain   Final    ABUNDANT WBC PRESENT,BOTH PMN AND MONONUCLEAR NO ORGANISMS SEEN    Culture   Final    NO GROWTH 1 DAY Performed at [facility], [address], [place], [place] [address]    Report Status PENDING  Incomplete  Aerobic/Anaerobic Culture (surgical/deep wound)     Status: None (Preliminary result)   Collection Time: 04/01/17  6:32 PM  Result Value Ref Range Status   Specimen Description   Final    HIP RIGHT PSEUDO CAPSULE Performed at [facility], [address], [place], [place] [address]    Special Requests   Final    NONE Performed at [facility], [address] [place], [place] [address]    Gram Stain   Final    RARE WBC PRESENT, PREDOMINANTLY MONONUCLEAR NO ORGANISMS SEEN Performed at [facility], [address], [place], [place] [address]    Culture PENDING  Incomplete   Report Status PENDING  Incomplete  Aerobic/Anaerobic Culture (surgical/deep wound)     Status: None (Preliminary result)   Collection Time: 04/01/17  6:33 PM  Result Value Ref Range Status   Specimen Description   Final    HIP RIGHT PSEUDO CAPSULE 2 Performed at [facility],  [address] [place], [place] [address]    Special Requests   Final    NONE Performed at [facility], [address] [place], [place] [address]    Gram Stain   Final    RARE WBC PRESENT, PREDOMINANTLY MONONUCLEAR NO ORGANISMS SEEN Performed at [facility], [address] [place], [place] [address]    Culture PENDING  Incomplete   Report Status PENDING  Incomplete    Cliffton Asters, MD [facility] [phone] pager   [phone] cell 04/02/2017, 4:41 PM

## 2017-04-02 NOTE — Progress Notes (Signed)
PT Cancellation Note  Patient Details Name: [name] [name] MRN: [id number] DOB: 24-Mar-1917   Cancelled Treatment:     PT order received but eval deferred - Hgb 7.8 with transfusion ordered.  Will follow.   Talonda Artist 04/02/2017, 11:40 AM

## 2017-04-03 LAB — TYPE AND SCREEN
ABO/RH(D): O POS
ANTIBODY SCREEN: NEGATIVE
Unit division: 0
Unit division: 0

## 2017-04-03 LAB — BPAM RBC
Blood Product Expiration Date: 201903192359
Blood Product Expiration Date: 201903192359
ISSUE DATE / TIME: 201902151324
ISSUE DATE / TIME: 201902151718
UNIT TYPE AND RH: 5100
Unit Type and Rh: 5100

## 2017-04-03 LAB — CBC
HEMATOCRIT: 29.3 % — AB (ref 36.0–46.0)
HEMOGLOBIN: 9.6 g/dL — AB (ref 12.0–15.0)
MCH: 27.7 pg (ref 26.0–34.0)
MCHC: 32.8 g/dL (ref 30.0–36.0)
MCV: 84.4 fL (ref 78.0–100.0)
Platelets: 181 10*3/uL (ref 150–400)
RBC: 3.47 MIL/uL — ABNORMAL LOW (ref 3.87–5.11)
RDW: 17.6 % — ABNORMAL HIGH (ref 11.5–15.5)
WBC: 6.9 10*3/uL (ref 4.0–10.5)

## 2017-04-03 LAB — BASIC METABOLIC PANEL
ANION GAP: 12 (ref 5–15)
BUN: 38 mg/dL — ABNORMAL HIGH (ref 6–20)
CHLORIDE: 107 mmol/L (ref 101–111)
CO2: 23 mmol/L (ref 22–32)
Calcium: 8.1 mg/dL — ABNORMAL LOW (ref 8.9–10.3)
Creatinine, Ser: 1.77 mg/dL — ABNORMAL HIGH (ref 0.44–1.00)
GFR calc non Af Amer: 23 mL/min — ABNORMAL LOW (ref >60)
GFR, EST AFRICAN AMERICAN: 27 mL/min — AB (ref >60)
Glucose, Bld: 168 mg/dL — ABNORMAL HIGH (ref 65–99)
Potassium: 4.7 mmol/L (ref 3.5–5.1)
Sodium: 142 mmol/L (ref 135–145)

## 2017-04-03 LAB — VANCOMYCIN, RANDOM: VANCOMYCIN RM: 11

## 2017-04-03 MED ORDER — LEVOFLOXACIN 500 MG PO TABS
250.0000 mg | ORAL_TABLET | ORAL | Status: DC
  Administered 2017-04-05: 250 mg via ORAL
  Filled 2017-04-03: qty 1

## 2017-04-03 MED ORDER — VANCOMYCIN HCL IN DEXTROSE 1-5 GM/200ML-% IV SOLN
1000.0000 mg | Freq: Once | INTRAVENOUS | Status: DC
  Filled 2017-04-03: qty 200

## 2017-04-03 NOTE — Evaluation (Signed)
Physical Therapy Evaluation Patient Details Name: [name] [name] MRN: [id number] DOB: 10/02/1917 Today's Date: 04/03/2017   History of Present Illness  Pt is a 84 y/o female who presents 2/14 from SNF rehab with drainage of recent  R hip hemiarthroplasty (anterior approach) and is WBAT - No active abduction allowed.  Clinical Impression  The patient tolerated  Mobilizing and stand at the bedside x 2 with 2 assist. Plans return to SNF for rehab. Pt admitted with above diagnosis. Pt currently with functional limitations due to the deficits listed below (see PT Problem List). Pt will benefit from skilled PT to increase their independence and safety with mobility to allow discharge to the venue listed below.       Follow Up Recommendations SNF    Equipment Recommendations  None recommended by PT    Recommendations for Other Services       Precautions / Restrictions Precautions Precautions: Fall Precaution Comments: no active abduction      Mobility  Bed Mobility Overal bed mobility: Needs Assistance Bed Mobility: Supine to Sit;Sit to Supine;Rolling Rolling: Total assist;+2 for physical assistance;+2 for safety/equipment   Supine to sit: Total assist;+2 for physical assistance;+2 for safety/equipment Sit to supine: Total assist;+2 for physical assistance;+2 for safety/equipment   General bed mobility comments: assist to roll, asisst with trunk and both legs, use of bed pad to slide patient.   Transfers Overall transfer level: Needs assistance Equipment used: Rolling walker (2 wheeled) Transfers: Sit to/from Stand Sit to Stand: Max assist;+2 physical assistance;+2 safety/equipment;From elevated surface         General transfer comment: assist to rise and steady at RW, practiced x 2. Able to step back to bed with cues.  Ambulation/Gait                Stairs            Wheelchair Mobility    Modified Rankin (Stroke Patients Only)       Balance                                             Pertinent Vitals/Pain Pain Assessment: Faces Faces Pain Scale: No hurt    Home Living Family/patient expects to be discharged to:: Skilled nursing facility                      Prior Function                 Hand Dominance        Extremity/Trunk Assessment   Upper Extremity Assessment Upper Extremity Assessment: Generalized weakness    Lower Extremity Assessment Lower Extremity Assessment: RLE deficits/detail;LLE deficits/detail RLE Deficits / Details: decreased dorsiflexion range, bears weight on the leg LLE Deficits / Details: same as right    Cervical / Trunk Assessment Cervical / Trunk Assessment: Kyphotic  Communication      Cognition Arousal/Alertness: Awake/alert Behavior During Therapy: WFL for tasks assessed/performed Overall Cognitive Status: History of cognitive impairments - at baseline                                        General Comments      Exercises     Assessment/Plan    PT Assessment Patient needs continued PT services  PT Problem List Decreased strength;Decreased range of motion;Decreased cognition;Decreased activity tolerance;Decreased knowledge of use of DME;Decreased balance;Decreased safety awareness;Decreased mobility;Decreased knowledge of precautions       PT Treatment Interventions DME instruction;Therapeutic exercise;Gait training;Functional mobility training;Therapeutic activities;Patient/family education    PT Goals (Current goals can be found in the Care Plan section)  Acute Rehab PT Goals Patient Stated Goal: agreed to sit up PT Goal Formulation: With patient/family Time For Goal Achievement: 04/10/17 Potential to Achieve Goals: Fair    Frequency Min 2X/week   Barriers to discharge        Co-evaluation               AM-PAC PT "6 Clicks" Daily Activity  Outcome Measure Difficulty turning over in bed (including adjusting  bedclothes, sheets and blankets)?: Unable Difficulty moving from lying on back to sitting on the side of the bed? : Unable Difficulty sitting down on and standing up from a chair with arms (e.g., wheelchair, bedside commode, etc,.)?: Unable Help needed moving to and from a bed to chair (including a wheelchair)?: Total Help needed walking in hospital room?: Total Help needed climbing 3-5 steps with a railing? : Total 6 Click Score: 6    End of Session   Activity Tolerance: Patient tolerated treatment well Patient left: in bed;with call bell/phone within reach;with bed alarm set;with family/visitor present;with nursing/sitter in room Nurse Communication: Mobility status PT Visit Diagnosis: Unsteadiness on feet (R26.81)    Time: 1914-7829 PT Time Calculation (min) (ACUTE ONLY): 17 min   Charges:   PT Evaluation $PT Eval Low Complexity: 1 Low     PT G CodesBlanchard Kelch PT [phone]   Rada Hay 04/03/2017, 4:24 PM

## 2017-04-03 NOTE — Progress Notes (Signed)
PHARMACY NOTE:  ANTIMICROBIAL RENAL DOSAGE ADJUSTMENT  Current antimicrobial regimen includes a mismatch between antimicrobial dosage and estimated renal function.  As per policy approved by the Pharmacy & Therapeutics and Medical Executive Committees, the antimicrobial dosage will be adjusted accordingly.  Current antimicrobial dosage:  Levofloxacin 250mg  q24h   Indication: prosthetic joint infection  Renal Function:  Estimated Creatinine Clearance: 13.5 mL/min (A) (by C-G formula based on SCr of 1.77 mg/dL (H)). []      On intermittent HD, scheduled: []      On CRRT    Antimicrobial dosage has been changed to:  Levofloxacin 250mg  today (to complete 500mg  Loading dose) then 250mg  PO q48h  Additional comments:   Thank you for allowing pharmacy to be a part of this patient's care.  Juliette Alcideustin Zeigler, PharmD, BCPS.   Pager: [phone] 04/03/2017 11:58 AM

## 2017-04-03 NOTE — Progress Notes (Signed)
      INFECTIOUS DISEASE ATTENDING ADDENDUM:   Date: 04/03/2017  Patient name: Nonda LouUdell S Shatz  Medical record number: [id number]  Date of birth: 1917/12/24   We have an E coli isolated from deep culture  I will DC vancomycin  Levaquin has been changed to PO in setting of PCN allergy  I WOULD NOT PLACE PICC YET GIVEN IDEA WE MIGHT USE A FLUOROQUINOLONE DEPENDING UPON SENSIS OF THIS E COLI    Acey LavCornelius Van Dam 04/03/2017, 7:03 PM

## 2017-04-03 NOTE — Progress Notes (Addendum)
Subjective:  Patient reports pain as mild to moderate.  Denies N/V/CP/SOB. States R knee is feeling better since injection yesterday. ID saw patient.  Objective:   VITALS:   Vitals:   04/02/17 1745 04/02/17 1912 04/02/17 2100 04/03/17 0550  BP: (!) 107/38 (!) 120/46 (!) 119/43 (!) 131/45  Pulse: 74 84 67 70  Resp: 16  16 18   Temp: 98.1 F (36.7 C)  97.7 F (36.5 C) 97.8 F (36.6 C)  TempSrc: Oral  Oral Oral  SpO2: 100%  96% 98%  Weight:      Height:        NAD ABD soft Sensation intact distally Intact pulses distally Dorsiflexion/Plantar flexion intact Incision: dressing C/D/I Compartment soft HV ss prevena intact  Lab Results  Component Value Date   WBC 6.9 04/03/2017   HGB 9.6 (L) 04/03/2017   HCT 29.3 (L) 04/03/2017   MCV 84.4 04/03/2017   PLT 181 04/03/2017   BMET    Component Value Date/Time   NA 142 04/03/2017 0521   K 4.7 04/03/2017 0521   CL 107 04/03/2017 0521   CO2 23 04/03/2017 0521   GLUCOSE 168 (H) 04/03/2017 0521   BUN 38 (H) 04/03/2017 0521   CREATININE 1.77 (H) 04/03/2017 0521   CALCIUM 8.1 (L) 04/03/2017 0521   GFRNONAA 23 (L) 04/03/2017 0521   GFRAA 27 (L) 04/03/2017 0521    Results for orders placed or performed during the hospital encounter of 04/01/17  Aerobic Culture (superficial specimen)     Status: None (Preliminary result)   Collection Time: 04/01/17  5:51 PM  Result Value Ref Range Status   Specimen Description   Final    TISSUE FATTY Performed at [facility], [address] [place], [place] [address]    Special Requests   Final    NONE Performed at [facility], [address] [place], [place] [address]    Gram Stain NO WBC SEEN NO ORGANISMS SEEN   Final   Culture   Final    CULTURE REINCUBATED FOR BETTER GROWTH Performed at [facility], [address] [place], [place] [address]    Report Status PENDING  Incomplete  Aerobic/Anaerobic Culture (surgical/deep  wound)     Status: None (Preliminary result)   Collection Time: 04/01/17  6:10 PM  Result Value Ref Range Status   Specimen Description   Final    SYNOVIAL JOINT Performed at [facility], [address] [place], [place] [address]    Special Requests   Final    NONE Performed at [facility], [address] [place], [place] [address]    Gram Stain   Final    ABUNDANT WBC PRESENT,BOTH PMN AND MONONUCLEAR NO ORGANISMS SEEN    Culture   Final    NO GROWTH 1 DAY Performed at [facility], [address] [place], [place] [address]    Report Status PENDING  Incomplete  Aerobic/Anaerobic Culture (surgical/deep wound)     Status: None (Preliminary result)   Collection Time: 04/01/17  6:32 PM  Result Value Ref Range Status   Specimen Description   Final    HIP RIGHT PSEUDO CAPSULE Performed at [facility], [address] [place], [place] [address]    Special Requests   Final    NONE Performed at [facility], [address] [place], [place] [address]    Gram Stain   Final  RARE WBC PRESENT, PREDOMINANTLY MONONUCLEAR NO ORGANISMS SEEN Performed at [facility], [address] [place], [place] [address]    Culture PENDING  Incomplete   Report Status PENDING  Incomplete  Aerobic/Anaerobic Culture (surgical/deep wound)     Status: None (Preliminary result)   Collection Time: 04/01/17  6:33 PM  Result Value Ref Range Status   Specimen Description   Final    HIP RIGHT PSEUDO CAPSULE 2 Performed at [facility], [address] [place], [place] [address]    Special Requests   Final    NONE Performed at [facility], [address] [place], [place] [address]    Gram Stain   Final    RARE WBC PRESENT, PREDOMINANTLY MONONUCLEAR NO ORGANISMS SEEN Performed at [facility], 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

## 2017-04-03 NOTE — Progress Notes (Signed)
Per ID, hold on PICC for now until further information on type of bacteria in cultures. PICC RN notified.

## 2017-04-03 NOTE — Progress Notes (Addendum)
Pharmacy Antibiotic Note  [name] [name] is a 84 y.o. female admitted on 04/01/2017 with Wound Infection.  Pharmacy has been consulted for Vancomycin dosing. patient with prosthetic hip infection s/p debridement and head ball exchange on 2/14.  ID has adjusted antibiotics.    Today, 04/03/2017  Day #2 vancomycin, levofloxacin, rifampin  Renal: SCr trending up   Afebrile  WBC WNL  Multiple intra-op cultures pending.  One cx reveals rare e. Coli.    Random vanco level = 11 mcg/mL (~48h after 1gm dose)  Today, 04/03/2017   Plan:  Give vancomycin 1gm IV x 1 (for peak ~34 mcg/mL)  Levofloxacin dose adjusted to 250mg  q48h for renal fx  Follow renal function  Recheck vancomycin level in 2-3 days depending on SCr trend  Follow cultures and ability to focus antibiotic therapy   Height: 4\' 10"  (147.3 cm) Weight: 125 lb (56.7 kg) IBW/kg (Calculated) : 40.9  Temp (24hrs), Avg:98.1 F (36.7 C), Min:97.7 F (36.5 C), Max:98.7 F (37.1 C)  Recent Labs  Lab 04/01/17 1455 04/02/17 0534 04/03/17 0521 04/03/17 1628  WBC 8.1 8.7 6.9  --   CREATININE 0.92 1.45* 1.77*  --   VANCORANDOM  --   --   --  11    Estimated Creatinine Clearance: 13.5 mL/min (A) (by C-G formula based on SCr of 1.77 mg/dL (H)).    Allergies  Allergen Reactions  . Bee Venom Swelling  . Penicillins Other (See Comments)    Has patient had a PCN reaction causing immediate rash, facial/tongue/throat swelling, SOB or lightheadedness with hypotension: Unknown Has patient had a PCN reaction causing severe rash involving mucus membranes or skin necrosis: Unknown Has patient had a PCN reaction that required hospitalization: Unknown Has patient had a PCN reaction occurring within the last 10 years: Unknown If all of the above answers are "NO", then may proceed with Cephalosporin use.    Antimicrobials this admission:  2/14 Vancomycin  >> 2/14 Clindamycin  >>  2/15 2/15 levofloxacin >> 2/15 rifampin  Dose  adjustments this admission:  2/16 VR at 1628 = 11 mcg/mL  (given vanco 1gm x 1 2/14 at ~1600) 2/16 vancomycin 1gm x 1 at 20:00  Microbiology results:  2/14 aerobic fatty tissue:rare e. coli 2/14 anaerobic fatty tissue: NGTD 2/14 synovial joint:NGTD 2/14 hip right pseudo capsule #1: NGTD 2/14 hip right pseudo capsule #2: NGTD  Thank you for allowing pharmacy to be a part of this patient's care.  Juliette Alcideustin Caeden Foots, PharmD, BCPS.   Pager: [phone] 04/03/2017 6:27 PM

## 2017-04-03 NOTE — Progress Notes (Signed)
Spoke with Irfa RN re PICC consent.  States she will obtain from family once they are at bedside.  Notified PICC potentially to be placed 04-04-17 am.

## 2017-04-04 ENCOUNTER — Inpatient Hospital Stay (HOSPITAL_COMMUNITY): Payer: Medicare Other

## 2017-04-04 LAB — BASIC METABOLIC PANEL
ANION GAP: 8 (ref 5–15)
BUN: 42 mg/dL — ABNORMAL HIGH (ref 6–20)
CALCIUM: 7.3 mg/dL — AB (ref 8.9–10.3)
CO2: 21 mmol/L — ABNORMAL LOW (ref 22–32)
Chloride: 111 mmol/L (ref 101–111)
Creatinine, Ser: 1.94 mg/dL — ABNORMAL HIGH (ref 0.44–1.00)
GFR calc non Af Amer: 21 mL/min — ABNORMAL LOW (ref >60)
GFR, EST AFRICAN AMERICAN: 24 mL/min — AB (ref >60)
GLUCOSE: 90 mg/dL (ref 65–99)
Potassium: 4.1 mmol/L (ref 3.5–5.1)
SODIUM: 140 mmol/L (ref 135–145)

## 2017-04-04 LAB — CBC
HCT: 25.9 % — ABNORMAL LOW (ref 36.0–46.0)
Hemoglobin: 8.7 g/dL — ABNORMAL LOW (ref 12.0–15.0)
MCH: 28.3 pg (ref 26.0–34.0)
MCHC: 33.6 g/dL (ref 30.0–36.0)
MCV: 84.4 fL (ref 78.0–100.0)
Platelets: 179 10*3/uL (ref 150–400)
RBC: 3.07 MIL/uL — ABNORMAL LOW (ref 3.87–5.11)
RDW: 18.1 % — AB (ref 11.5–15.5)
WBC: 9.9 10*3/uL (ref 4.0–10.5)

## 2017-04-04 LAB — AEROBIC CULTURE  (SUPERFICIAL SPECIMEN)

## 2017-04-04 LAB — CREATININE, URINE, RANDOM: CREATININE, URINE: 23.49 mg/dL

## 2017-04-04 LAB — SODIUM, URINE, RANDOM: Sodium, Ur: 110 mmol/L

## 2017-04-04 LAB — AEROBIC CULTURE W GRAM STAIN (SUPERFICIAL SPECIMEN): Gram Stain: NONE SEEN

## 2017-04-04 NOTE — Consult Note (Signed)
[facility] Medical Consultation  Nonda LouUdell S Milan UJW:[id number] DOB: [birth year] DOA: 04/01/2017 PCP: Reynolds Bowlomstock, Lloyd, MD   Requesting physician: Dr. 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 Coli - pt was on Vancomycin and Rifampin but per iD, now changed to Levaquin  - per ID, will wait on PICC line for now until sensitivity report back   Active Problems:   Anemia - slight drop in Hg noted and could be post op vs dilutional component - no evidence of active bleeding - CBC in AM    Acute kidney injury - suspect pre renal etiology and possible progression to ATN - not clear if related to vancomycin  - will check FENa and renal US - agree with lowering rate of IVF to 75 cc/hr - repeat BMP in AM   I will followup again tomorrow. Please contact me if I can be of assistance in the meanwhile. Thank you for this consultation.  Debbora PrestoMAGICK-Keylin Podolsky, MD  [facility] Pager [phone] Cell [phone]  If 7PM-7AM, please contact night-coverage [email or web address] Password [email or web address]  HPI:  Pt is 84 yo female with who underwent right hip arthroplasty 02/15/2017 for displaced femoral neck fracture and now presented for evaluation and management of wound dehiscence. She underwent I&D of right hip wound and today is post op day #3. Pt has done well post op but blood work notable for progressively worsening renal function and TRH asked further evaluate. Pt currently reports pain is controlled, she denies any abd or urinary concerns, she is eating breakfast and says she is tolerating it well. No fevers, chills, no chest pain or dyspnea.  Review of Systems: Constitutional: Negative for fever, chills, diaphoresis, activity change, appetite change and fatigue.  HENT: Negative for ear pain, nosebleeds, congestion, facial  swelling, rhinorrhea, neck pain, neck stiffness and ear discharge.   Eyes: Negative for pain, discharge, redness, itching and visual disturbance.  Respiratory: Negative for cough, choking, chest tightness, shortness of breath, wheezing and stridor.   Cardiovascular: Negative for chest pain, palpitations and leg swelling.  Gastrointestinal: Negative for abdominal distention.  Genitourinary: Negative for dysuria, urgency, frequency, hematuria, flank pain, decreased urine volume, difficulty urinating and dyspareunia.  Musculoskeletal: Negative for back pain, joint swelling, arthralgias Neurological: Negative for dizziness, tremors, seizures, syncope, facial asymmetry, speech difficulty Hematological: Negative for adenopathy. Does not bruise/bleed easily.  Psychiatric/Behavioral: Negative for hallucinations, behavioral problems, confusion, dysphoric mood, decreased concentration and agitation.   Past Medical History:  Diagnosis Date  . Anemia    blood transfusion during hospitalization of 02/15/2017   . Arthritis   . Chronic kidney disease    CKD- stage III  . Constipation   . Diabetes mellitus type II 02/19/2011   diet controlled   . Dizziness   . E. coli urinary tract infection   . Gout   . Gout spondylitis   . 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 History of dizziness   . History of gout   . Hyperlipidemia   . Hypertension   . Overactive bladder   . Sciatica   . Stroke [facility](HCC)    Past Surgical History:  Procedure Laterality Date  . ABDOMINAL HYSTERECTOMY    . HIP PINNING,CANNULATED Right 02/15/2017   Procedure: ANTERIOR APPROACH HEMI HIP ARTHROPLASTY;  Surgeon: Samson FredericSwinteck, Brian, MD;  Location: [facility] OR;  Service: Orthopedics;  Laterality: Right;  .  INCISION AND DRAINAGE HIP Right 04/01/2017   Procedure: DEBRIDEMENT OF RIGHT HIP,  HEAD BALL EXCHANGE;  Surgeon: Samson Frederic, MD;  Location: [facility] ORS;  Service: Orthopedics;  Laterality: Right;  Needs  RNFA   Social History:  reports that  has never smoked. she has never used smokeless tobacco. She reports that she does not drink alcohol or use drugs.  Allergies  Allergen Reactions  . Bee Venom Swelling  . Penicillins Other (See Comments)    Has patient had a PCN reaction causing immediate rash, facial/tongue/throat swelling, SOB or lightheadedness with hypotension: Unknown Has patient had a PCN reaction causing severe rash involving mucus membranes or skin necrosis: Unknown Has patient had a PCN reaction that required hospitalization: Unknown Has patient had a PCN reaction occurring within the last 10 years: Unknown If all of the above answers are "NO", then may proceed with Cephalosporin use.    Family History  Problem Relation Age of Onset  . Stroke Mother   . Stroke Father     Prior to Admission medications   Medication Sig Start Date End Date Taking? Authorizing Provider  allopurinol (ZYLOPRIM) 100 MG tablet Take 200 mg by mouth at bedtime.    Yes [provider]  atorvastatin (LIPITOR) 10 MG tablet Take 10 mg by mouth at bedtime.  12/22/16  Yes [provider]  clopidogrel (PLAVIX) 75 MG tablet Take 75 mg by mouth daily.  06/08/14  Yes [provider]  doxycycline (VIBRAMYCIN) 100 MG capsule Take 100 mg by mouth 2 (two) times daily.   Yes [provider]  famotidine (PEPCID) 20 MG tablet Take 20 mg by mouth daily.  07/06/14  Yes [provider]  fesoterodine (TOVIAZ) 4 MG TB24 tablet Take 1 tablet (4 mg total) by mouth daily. 03/02/17  Yes Angiulli, Mcarthur Rossetti, PA-C  loratadine (CLARITIN) 10 MG tablet Take 10 mg by mouth daily.   Yes [provider]  meclizine (ANTIVERT) 25 MG tablet Take 25 mg by mouth 3 (three) times daily as needed for dizziness.    Yes [provider]  senna-docusate (SENOKOT-S) 8.6-50 MG tablet Take 2 tablets by mouth 2 (two) times daily. 03/02/17  Yes Angiulli, Mcarthur Rossetti, PA-C  torsemide (DEMADEX) 10  MG tablet Take 1 tablet (10 mg total) by mouth daily as needed. Patient taking differently: Take 5 mg by mouth daily.  02/18/17  Yes Rolly Salter, MD  traMADol (ULTRAM) 50 MG tablet Take 1 tablet (50 mg total) by mouth every 6 (six) hours as needed for moderate pain. Patient taking differently: Take 50 mg by mouth See admin instructions. Take 50 mg by mouth at bedtime. Take 50 mg by mouth every 6 hours as needed for pain 03/02/17  Yes Angiulli, Mcarthur Rossetti, PA-C  aspirin 81 MG chewable tablet Chew 1 tablet (81 mg total) by mouth 2 (two) times daily with a meal. Patient not taking: Reported on 03/31/2017 02/17/17   Samson Frederic, MD  diclofenac sodium (VOLTAREN) 1 % GEL Apply 2 g topically 4 (four) times daily. 03/02/17   Angiulli, Mcarthur Rossetti, PA-C   Physical Exam: Blood pressure 104/80, pulse 76, temperature 97.8 F (36.6 C), temperature source Oral, resp. rate 15, height 4\' 10"  (1.473 m), weight 56.7 kg (125 lb), SpO2 96 %. Vitals:   04/03/17 1953 04/04/17 0403  BP: (!) 142/98 104/80  Pulse: 73 76  Resp: 17 15  Temp: 98.5 F (36.9 C) 97.8 F (36.6 C)  SpO2: 96% 96%   Physical  Exam  Constitutional: Appears well-developed and well-nourished. No distress.  HENT: Normocephalic. External right and left ear normal. Oropharynx is clear and moist.  Eyes: Conjunctivae and EOM are normal. PERRLA, no scleral icterus.  Neck: Normal ROM. Neck supple. No JVD. No tracheal deviation. No thyromegaly.  CVS: RRR, S1/S2 +, no murmurs, no gallops, no carotid bruit.  Pulmonary: Effort and breath sounds normal, no stridor, rhonchi, wheezes, rales.  Abdominal: Soft. BS +,  no distension, tenderness, rebound or guarding.  Musculoskeletal: No edema and no tenderness.  Neuro: Alert. Normal reflexes, muscle tone coordination.  Skin: Skin is warm and dry. No rash noted. Psychiatric: Normal mood and affect. Behavior, judgment, thought content normal.   Labs on Admission:  Basic Metabolic Panel: Recent Labs  Lab  04/01/17 1455 04/02/17 0534 04/03/17 0521 04/04/17 0510  NA 141 140 142 140  K 3.3* 4.1 4.7 4.1  CL 105 104 107 111  CO2 24 25 23  21*  GLUCOSE 114* 137* 168* 90  BUN 25* 26* 38* 42*  CREATININE 0.92 1.45* 1.77* 1.94*  CALCIUM 9.1 8.5* 8.1* 7.3*   CBC: Recent Labs  Lab 04/01/17 1455 04/01/17 2111 04/02/17 0534 04/03/17 0521 04/04/17 0510  WBC 8.1  --  8.7 6.9 9.9  NEUTROABS 4.6  --   --   --   --   HGB 9.6* 7.9* 7.8* 9.6* 8.7*  HCT 30.1* 24.2* 24.0* 29.3* 25.9*  MCV 87.0  --  87.9 84.4 84.4  PLT 272  --  216 181 179   CBG: Recent Labs  Lab 04/01/17 1451 04/01/17 2034  GLUCAP 97 155*   Radiological Exams on Admission: No results found.  Time spent: 50 minutes   Lacheryl Niesen Magick-Hatsue Sime [facility] Pager [phone]  If 7PM-7AM, please contact night-coverage [email or web address] Password [facility] 04/04/2017, 12:15 PM

## 2017-04-04 NOTE — Evaluation (Signed)
Clinical/Bedside Swallow Evaluation Patient Details  Name: [name] [name] MRN: [id number] Date of Birth: [birth year]  Today's Date: 04/04/2017 Time: SLP Start Time (ACUTE ONLY): 1725 SLP Stop Time (ACUTE ONLY): 1755 SLP Time Calculation (min) (ACUTE ONLY): 30 min  Past Medical History:  Past Medical History:  Diagnosis Date  . Anemia    blood transfusion during hospitalization of 02/15/2017   . Arthritis   . Chronic kidney disease    CKD- stage III  . Constipation   . Diabetes mellitus type II 02/19/2011   diet controlled   . Dizziness   . E. coli urinary tract infection   . Gout   . Gout spondylitis   . 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 History of dizziness   . History of gout   . Hyperlipidemia   . Hypertension   . Overactive bladder   . Sciatica   . Stroke [facility])    Past Surgical History:  Past Surgical History:  Procedure Laterality Date  . ABDOMINAL HYSTERECTOMY    . HIP PINNING,CANNULATED Right 02/15/2017   Procedure: ANTERIOR APPROACH HEMI HIP ARTHROPLASTY;  Surgeon: Samson Frederic, MD;  Location: [facility] OR;  Service: Orthopedics;  Laterality: Right;  . INCISION AND DRAINAGE HIP Right 04/01/2017   Procedure: DEBRIDEMENT OF RIGHT HIP,  HEAD BALL EXCHANGE;  Surgeon: Samson Frederic, MD;  Location: [facility] ORS;  Service: Orthopedics;  Laterality: Right;  Needs RNFA   HPI:  Pt is 84 yo female with history of CVA (2013), DM, CKD, who underwent right hip arthroplasty 02/15/2017 for displaced femoral neck fracture and presented for evaluation and management of wound dehiscence. She underwent I&D of right hip wound. Referred for swallowing evaluation.    Assessment / Plan / Recommendation Clinical Impression   Patient presents with mild-moderate oral dysphagia with impaired mastication impacted by ill-fitting dentures. Per RN, pt has been pocketing food. Bedside swallow assessment performed as pt self-fed items from her dinner tray, including  regular and soft solids, purees and thin liquids.There is prolonged mastication with both regular and soft solids. Pt required approximately 7 minutes to masticate a single piece of dry chicken, which she held in her right buccal cavity while taking additional small bites of puree from her tray. Pt ultimately cleared with liquid wash. No overt signs of aspiration observed, however pt is at risk of airway compromise if pocketing solids. Educated pt, granddaughter re: recommendations for soft, chopped solids to reduce efforts of mastication, clearing oral cavity prior to taking additional bites. Occasional min A required to reduce rate of intake, bolus size. Encouraged granddaughter to bring pt's denture adhesive as this may aid mastication efforts. Recommend dys 2 with thin liquids, continue medications crushed in puree as pt tolerating well per RN. Will follow briefly.   SLP Visit Diagnosis: Dysphagia, oral phase (R13.11)    Aspiration Risk  Mild aspiration risk    Diet Recommendation Dysphagia 2 (Fine chop);Thin liquid   Liquid Administration via: Cup;Straw Medication Administration: Crushed with puree Supervision: Intermittent supervision to cue for compensatory strategies;Patient able to self feed Compensations: Slow rate;Small sips/bites;Lingual sweep for clearance of pocketing;Follow solids with liquid    Other  Recommendations Oral Care Recommendations: Oral care BID   Follow up Recommendations Other (comment)(tbd)      Frequency and Duration min 1 x/week  1 week       Prognosis Prognosis for Safe Diet Advancement: Good Barriers to Reach Goals: Other (Comment)(ill-fitting dentition)      Swallow Study  General Date of Onset: 04/01/17 HPI: Pt is 84 yo female with history of CVA (2013), DM, CKD, who underwent right hip arthroplasty 02/15/2017 for displaced femoral neck fracture and presented for evaluation and management of wound dehiscence. She underwent I&D of right hip wound.  Referred for swallowing evaluation.  Type of Study: Bedside Swallow Evaluation Previous Swallow Assessment: none on file Diet Prior to this Study: Regular;Thin liquids Temperature Spikes Noted: No Respiratory Status: Room air History of Recent Intubation: No Behavior/Cognition: Alert;Cooperative;Pleasant mood Oral Cavity Assessment: Within Functional Limits Oral Care Completed by SLP: No Oral Cavity - Dentition: Dentures, top;Dentures, bottom(no adhesive) Vision: Functional for self-feeding Self-Feeding Abilities: Able to feed self Patient Positioning: Upright in bed Baseline Vocal Quality: Normal Volitional Cough: Strong Volitional Swallow: Able to elicit    Oral/Motor/Sensory Function Overall Oral Motor/Sensory Function: Within functional limits   Ice Chips Ice chips: Not tested   Thin Liquid Thin Liquid: Within functional limits Presentation: Straw;Self Fed    Nectar Thick Nectar Thick Liquid: Not tested   Honey Thick Honey Thick Liquid: Not tested   Puree Puree: Within functional limits Presentation: Self Fed;Spoon   Solid   GO   Solid: Impaired Presentation: Self Fed;Spoon Oral Phase Impairments: Impaired mastication Oral Phase Functional Implications: Right lateral sulci pocketing;Left lateral sulci pocketing;Prolonged oral transit;Impaired mastication;Oral residue       [name] BatonMary Beth Linkon Siverson, MS, CCC-SLP Speech-Language Pathologist  [name] LindauMary E Santos [name] 04/04/2017,6:01 PM

## 2017-04-04 NOTE — Progress Notes (Signed)
Dictation #1 [id number] Subjective: 3 Days Post-Op Procedure(s) (LRB): DEBRIDEMENT OF RIGHT HIP,  HEAD BALL EXCHANGE (Right) Patient reports pain as 3 on 0-10 scale.    Objective: Vital signs in last 24 hours: Temp:  [97.8 F (36.6 C)-98.7 F (37.1 C)] 97.8 F (36.6 C) (02/17 0403) Pulse Rate:  [70-76] 76 (02/17 0403) Resp:  [15-17] 15 (02/17 0403) BP: (104-142)/(40-98) 104/80 (02/17 0403) SpO2:  [96 %] 96 % (02/17 0403)  Intake/Output from previous day: 02/16 0701 - 02/17 0700 In: 3530 [P.O.:530; I.V.:3000] Out: 625 [Urine:550; Drains:75] Intake/Output this shift: No intake/output data recorded.  Recent Labs    04/01/17 1455 04/01/17 2111 04/02/17 0534 04/03/17 0521 04/04/17 0510  HGB 9.6* 7.9* 7.8* 9.6* 8.7*   Recent Labs    04/03/17 0521 04/04/17 0510  WBC 6.9 9.9  RBC 3.47* 3.07*  HCT 29.3* 25.9*  PLT 181 179   Recent Labs    04/03/17 0521 04/04/17 0510  NA 142 140  K 4.7 4.1  CL 107 111  CO2 23 21*  BUN 38* 42*  CREATININE 1.77* 1.94*  GLUCOSE 168* 90  CALCIUM 8.1* 7.3*   No results for input(s): LABPT, INR in the last 72 hours.  Neurologically intact Sensation intact distally Dorsiflexion/Plantar flexion intact Incision: dressing C/D/I and no drainage No cellulitis present  Assessment/Plan: 3 Days Post-Op Procedure(s) (LRB): DEBRIDEMENT OF RIGHT HIP,  HEAD BALL EXCHANGE (Right) CR up UO down. Advance diet  Medicine consult for ARF. Decrease IVF. I/O up last three days over 3L.  Anneka Studer C 04/04/2017, 8:18 AM

## 2017-04-04 NOTE — Progress Notes (Signed)
   INFECTIOUS DISEASE ATTENDING ADDENDUM:   Patient is growing a very sensitive E. coli from deep cultures  I would continue oral levofloxacin divided no other organisms have been isolated and I would avoid placement of PICC line  I would try to get her through a month of therapy with a fluoroquinolone and then consider her to Bactrim which would have less risk of C. difficile colitis in this 84 year old woman.  Dr. Orvan Falconerampbell will be back tomorrow to follow up on final culture data.

## 2017-04-05 DIAGNOSIS — B9620 Unspecified Escherichia coli [E. coli] as the cause of diseases classified elsewhere: Secondary | ICD-10-CM

## 2017-04-05 DIAGNOSIS — T8451XD Infection and inflammatory reaction due to internal right hip prosthesis, subsequent encounter: Secondary | ICD-10-CM

## 2017-04-05 LAB — BASIC METABOLIC PANEL
ANION GAP: 10 (ref 5–15)
BUN: 33 mg/dL — ABNORMAL HIGH (ref 6–20)
CHLORIDE: 114 mmol/L — AB (ref 101–111)
CO2: 20 mmol/L — ABNORMAL LOW (ref 22–32)
Calcium: 7.9 mg/dL — ABNORMAL LOW (ref 8.9–10.3)
Creatinine, Ser: 1.45 mg/dL — ABNORMAL HIGH (ref 0.44–1.00)
GFR calc Af Amer: 34 mL/min — ABNORMAL LOW (ref >60)
GFR calc non Af Amer: 29 mL/min — ABNORMAL LOW (ref >60)
GLUCOSE: 100 mg/dL — AB (ref 65–99)
POTASSIUM: 3.9 mmol/L (ref 3.5–5.1)
Sodium: 144 mmol/L (ref 135–145)

## 2017-04-05 LAB — CBC
HEMATOCRIT: 29.3 % — AB (ref 36.0–46.0)
HEMOGLOBIN: 9.4 g/dL — AB (ref 12.0–15.0)
MCH: 27.6 pg (ref 26.0–34.0)
MCHC: 32.1 g/dL (ref 30.0–36.0)
MCV: 86.2 fL (ref 78.0–100.0)
Platelets: 198 10*3/uL (ref 150–400)
RBC: 3.4 MIL/uL — ABNORMAL LOW (ref 3.87–5.11)
RDW: 18.4 % — ABNORMAL HIGH (ref 11.5–15.5)
WBC: 10.1 10*3/uL (ref 4.0–10.5)

## 2017-04-05 MED ORDER — ENSURE ENLIVE PO LIQD
237.0000 mL | Freq: Two times a day (BID) | ORAL | Status: DC
  Administered 2017-04-06 – 2017-04-07 (×3): 237 mL via ORAL

## 2017-04-05 NOTE — Progress Notes (Signed)
Patient ID: [name] [name], female   DOB: 07/21/1917, 84 y.o.   MRN: [id number]          [facility]  Date of Admission:  04/01/2017   Total days of antibiotics 5        Day 3 levofloxacin         ASSESSMENT: Superficial fatty tissue debrided at the time of surgery is growing rare E. coli.  Deeper right hip cultures are negative so far but cultures are not final yet.  PLAN: 1. 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 allopurinol  200 mg Oral QHS  . aspirin  81 mg Oral BID  . atorvastatin  10 mg Oral QHS  . clopidogrel  75 mg Oral Daily  . docusate sodium  100 mg Oral BID  . famotidine  20 mg Oral Daily  . feeding supplement (ENSURE ENLIVE)  237 mL Oral BID BM  . fesoterodine  4 mg Oral Daily  . levofloxacin  250 mg Oral Q48H  . loratadine  10 mg Oral Daily  . senna  2 tablet Oral QHS  . torsemide  5 mg Oral Daily   Continuous Infusions: . sodium chloride 75 mL/hr at 04/05/17 1104  . methocarbamol (ROBAXIN)  IV     PRN Meds:.acetaminophen **OR** acetaminophen, alum & mag hydroxide-simeth, diphenhydrAMINE, HYDROcodone-acetaminophen, HYDROcodone-acetaminophen, HYDROmorphone (DILAUDID) injection, meclizine, menthol-cetylpyridinium **OR** phenol, methocarbamol **OR** methocarbamol (ROBAXIN)  IV, metoCLOPramide **OR** metoCLOPramide (REGLAN) injection, ondansetron **OR** ondansetron (ZOFRAN) IV, polyethylene glycol   SUBJECTIVE: She apologized for being in bed when I arrived.  She said "I am just being lazy today".  Review of Systems: Review of Systems  Constitutional: Negative for chills, diaphoresis and fever.  Gastrointestinal: Negative for abdominal pain, diarrhea, nausea and vomiting.    Musculoskeletal: Positive for joint pain.    Allergies  Allergen Reactions  . Bee Venom Swelling  . Penicillins Other (See Comments)    Has patient had a PCN reaction causing immediate rash, facial/tongue/throat swelling, SOB or lightheadedness with hypotension: Unknown Has patient had a PCN reaction causing severe rash involving mucus membranes or skin necrosis: Unknown Has patient had a PCN reaction that required hospitalization: Unknown Has patient had a PCN reaction occurring within the last 10 years: Unknown If all of the above answers are "NO", then may proceed with Cephalosporin use.     OBJECTIVE: Vitals:   04/04/17 0403 04/04/17 1415 04/04/17 2054 04/05/17 0620  BP: 104/80 110/78 130/86 (!) 145/80  Pulse: 76 79 88 95  Resp: 15 16 15 18   Temp: 97.8 F (36.6 C) 97.7 F (36.5 C) 97.6 F (36.4 C) 97.9 F (36.6 C)  TempSrc: Oral Oral Oral Oral  SpO2: 96% 97% 96% 98%  Weight:      Height:       Body mass index is 26.13 kg/m.  Physical Exam  Constitutional: She is oriented to person, place, and time.  She is in good spirits.  Neurological: She is alert and oriented to person, place, and time.  Psychiatric: Mood and affect normal.    Lab Results Lab Results  Component Value Date   WBC 10.1 04/05/2017   HGB 9.4 (L) 04/05/2017   HCT 29.3 (L) 04/05/2017   MCV 86.2 04/05/2017  PLT 198 04/05/2017    Lab Results  Component Value Date   CREATININE 1.45 (H) 04/05/2017   BUN 33 (H) 04/05/2017   NA 144 04/05/2017   K 3.9 04/05/2017   CL 114 (H) 04/05/2017   CO2 20 (L) 04/05/2017    Lab Results  Component Value Date   ALT 74 (H) 02/19/2017   AST 77 (H) 02/19/2017   ALKPHOS 83 02/19/2017   BILITOT 1.3 (H) 02/19/2017     Microbiology: Recent Results (from the past 240 hour(s))  Anaerobic culture     Status: None (Preliminary result)   Collection Time: 04/01/17  5:51 PM  Result Value Ref Range Status   Specimen Description   Final    TISSUE  FATTY Performed at [facility], [address] [place], [place] [address]    Special Requests   Final    NONE Performed at [facility], [address] [place], [place] [address]    Culture   Final    NO ANAEROBES ISOLATED; CULTURE IN PROGRESS FOR 5 DAYS   Report Status PENDING  Incomplete  Aerobic Culture (superficial specimen)     Status: None   Collection Time: 04/01/17  5:51 PM  Result Value Ref Range Status   Specimen Description   Final    TISSUE FATTY Performed at [facility], [address] [place], [place] [address]    Special Requests   Final    NONE Performed at [facility], [address] [place], [place] [address]    Gram Stain NO WBC SEEN NO ORGANISMS SEEN   Final   Culture   Final    RARE ESCHERICHIA COLI CRITICAL RESULT CALLED TO, READ BACK BY AND VERIFIED WITH: E HABIB,RN AT 1325 04/03/17 BY L BENFIELD CONCERNING GROWTH ON CULTURE Performed at [facility], [address] [place], [place] [address]    Report Status 04/04/2017 FINAL  Final   Organism ID, Bacteria ESCHERICHIA COLI  Final      Susceptibility   Escherichia coli - MIC*    AMPICILLIN <=2 SENSITIVE Sensitive     CEFAZOLIN <=4 SENSITIVE Sensitive     CEFEPIME <=1 SENSITIVE Sensitive     CEFTAZIDIME <=1 SENSITIVE Sensitive     CEFTRIAXONE <=1 SENSITIVE Sensitive     CIPROFLOXACIN <=0.25 SENSITIVE Sensitive     GENTAMICIN <=1 SENSITIVE Sensitive     IMIPENEM <=0.25 SENSITIVE Sensitive     TRIMETH/SULFA <=20 SENSITIVE Sensitive     AMPICILLIN/SULBACTAM <=2 SENSITIVE Sensitive     PIP/TAZO <=4 SENSITIVE Sensitive     Extended ESBL NEGATIVE Sensitive     * RARE ESCHERICHIA COLI  Aerobic/Anaerobic Culture (surgical/deep wound)     Status: None (Preliminary result)   Collection Time: 04/01/17  6:10 PM  Result Value Ref Range Status   Specimen Description   Final    SYNOVIAL JOINT Performed at [facility], [address] [place], [place] [address]    Special Requests   Final    NONE Performed at [facility], [address] [place], [place] [address]    Gram Stain   Final    ABUNDANT WBC PRESENT,BOTH PMN AND MONONUCLEAR NO ORGANISMS SEEN    Culture   Final    NO GROWTH 3 DAYS NO ANAEROBES ISOLATED; CULTURE IN PROGRESS FOR 5 DAYS Performed at [facility], [address] [place], [place] [address]    Report Status  PENDING  Incomplete  Aerobic/Anaerobic Culture (surgical/deep wound)     Status: None (Preliminary result)   Collection Time: 04/01/17  6:32 PM  Result Value Ref Range Status   Specimen Description   Final    HIP RIGHT PSEUDO CAPSULE Performed at [facility] [address], [place], [place] [address]    Special Requests   Final    NONE Performed at [facility], [address], [place], [place] [address]    Gram Stain   Final    RARE WBC PRESENT, PREDOMINANTLY MONONUCLEAR NO ORGANISMS SEEN    Culture   Final    NO GROWTH 2 DAYS NO ANAEROBES ISOLATED; CULTURE IN PROGRESS FOR 5 DAYS Performed at [facility], [address], [place], [place] [address]    Report Status PENDING  Incomplete  Aerobic/Anaerobic Culture (surgical/deep wound)     Status: None (Preliminary result)   Collection Time: 04/01/17  6:33 PM  Result Value Ref Range Status   Specimen Description   Final    HIP RIGHT PSEUDO CAPSULE 2 Performed at [facility], [address] [place], [place] [address]    Special Requests   Final    NONE Performed at [facility], [address], [place], [place] [address]    Gram Stain   Final    RARE WBC PRESENT, PREDOMINANTLY MONONUCLEAR NO ORGANISMS SEEN    Culture   Final    NO GROWTH 2 DAYS NO ANAEROBES ISOLATED; CULTURE IN PROGRESS FOR 5 DAYS Performed at [facility], [address], [place], [place] [address]     Report Status PENDING  Incomplete    Cliffton Asters, MD [facility] for Infectious [facility] [phone] pager   [phone] cell 04/05/2017, 2:41 PM

## 2017-04-05 NOTE — Progress Notes (Signed)
Subjective:  Patient reports pain as mild to moderate.  Denies N/V/CP/SOB. Hospitalist saw patient yesterday for AoCRF.  Objective:   VITALS:   Vitals:   04/04/17 0403 04/04/17 1415 04/04/17 2054 04/05/17 0620  BP: 104/80 110/78 130/86 (!) 145/80  Pulse: 76 79 88 95  Resp: 15 16 15 18   Temp: 97.8 F (36.6 C) 97.7 F (36.5 C) 97.6 F (36.4 C) 97.9 F (36.6 C)  TempSrc: Oral Oral Oral Oral  SpO2: 96% 97% 96% 98%  Weight:      Height:        NAD ABD soft Sensation intact distally Intact pulses distally Dorsiflexion/Plantar flexion intact Incision: dressing C/D/I Compartment soft prevena intact  Lab Results  Component Value Date   WBC 10.1 04/05/2017   HGB 9.4 (L) 04/05/2017   HCT 29.3 (L) 04/05/2017   MCV 86.2 04/05/2017   PLT 198 04/05/2017   BMET    Component Value Date/Time   NA 144 04/05/2017 0555   K 3.9 04/05/2017 0555   CL 114 (H) 04/05/2017 0555   CO2 20 (L) 04/05/2017 0555   GLUCOSE 100 (H) 04/05/2017 0555   BUN 33 (H) 04/05/2017 0555   CREATININE 1.45 (H) 04/05/2017 0555   CALCIUM 7.9 (L) 04/05/2017 0555   GFRNONAA 29 (L) 04/05/2017 0555   GFRAA 34 (L) 04/05/2017 0555    Results for orders placed or performed during the hospital encounter of 04/01/17  Anaerobic culture     Status: None (Preliminary result)   Collection Time: 04/01/17  5:51 PM  Result Value Ref Range Status   Specimen Description   Final    TISSUE FATTY Performed at [facility], [address] [place], [place] [address]    Special Requests   Final    NONE Performed at [facility], [address] [place], [place] [address]    Culture   Final    NO ANAEROBES ISOLATED; CULTURE IN PROGRESS FOR 5 DAYS   Report Status PENDING  Incomplete  Aerobic Culture (superficial specimen)     Status: None   Collection Time: 04/01/17  5:51 PM  Result Value Ref Range Status   Specimen Description   Final    TISSUE FATTY Performed at [facility], [address] [place], [place] [address]    Special Requests   Final    NONE Performed at [facility], [address] [place], [place] [address]    Gram Stain NO WBC SEEN NO ORGANISMS SEEN   Final   Culture   Final    RARE ESCHERICHIA COLI CRITICAL RESULT CALLED TO, READ BACK BY AND VERIFIED WITH: E HABIB,RN AT 1325 04/03/17 BY L BENFIELD CONCERNING GROWTH ON CULTURE Performed at [facility], [address] [place], [place] [address]    Report Status 04/04/2017 FINAL  Final   Organism ID, Bacteria ESCHERICHIA COLI  Final      Susceptibility   Escherichia coli - MIC*    AMPICILLIN <=2 SENSITIVE Sensitive     CEFAZOLIN <=4 SENSITIVE Sensitive     CEFEPIME <=1 SENSITIVE Sensitive     CEFTAZIDIME <=1 SENSITIVE Sensitive     CEFTRIAXONE <=1 SENSITIVE Sensitive     CIPROFLOXACIN <=0.25 SENSITIVE Sensitive     GENTAMICIN <=1 SENSITIVE Sensitive     IMIPENEM <=0.25 SENSITIVE Sensitive     TRIMETH/SULFA <=20 SENSITIVE Sensitive     AMPICILLIN/SULBACTAM <=2 SENSITIVE Sensitive     PIP/TAZO <=4  SENSITIVE Sensitive     Extended ESBL NEGATIVE Sensitive     * RARE ESCHERICHIA COLI  Aerobic/Anaerobic Culture (surgical/deep wound)     Status: None (Preliminary result)   Collection Time: 04/01/17  6:10 PM  Result Value Ref Range Status   Specimen Description   Final    SYNOVIAL JOINT Performed at [facility], [address] [place], [place] [address]    Special Requests   Final    NONE Performed at [facility], [address], [place], [place] [address]    Gram Stain   Final    ABUNDANT WBC PRESENT,BOTH PMN AND MONONUCLEAR NO ORGANISMS SEEN    Culture   Final    NO GROWTH 3 DAYS NO ANAEROBES ISOLATED; CULTURE IN PROGRESS FOR 5 DAYS Performed at [facility], [address] [place], [place] [address]    Report Status PENDING  Incomplete  Aerobic/Anaerobic Culture (surgical/deep  wound)     Status: None (Preliminary result)   Collection Time: 04/01/17  6:32 PM  Result Value Ref Range Status   Specimen Description   Final    HIP RIGHT PSEUDO CAPSULE Performed at [facility], [address] [place], [place] [address]    Special Requests   Final    NONE Performed at [facility], [address] [place], [place] [address]    Gram Stain   Final    RARE WBC PRESENT, PREDOMINANTLY MONONUCLEAR NO ORGANISMS SEEN    Culture   Final    NO GROWTH 2 DAYS NO ANAEROBES ISOLATED; CULTURE IN PROGRESS FOR 5 DAYS Performed at [facility], [address] [place], [place] [address]    Report Status PENDING  Incomplete  Aerobic/Anaerobic Culture (surgical/deep wound)     Status: None (Preliminary result)   Collection Time: 04/01/17  6:33 PM  Result Value Ref Range Status   Specimen Description   Final    HIP RIGHT PSEUDO CAPSULE 2 Performed at [facility], [address] [place], [place] [address]    Special Requests   Final    NONE Performed at [facility], [address] [place], [place] [address]    Gram Stain   Final    RARE WBC PRESENT, PREDOMINANTLY MONONUCLEAR NO ORGANISMS SEEN    Culture   Final    NO GROWTH 2 DAYS NO ANAEROBES ISOLATED; CULTURE IN PROGRESS FOR 5 DAYS Performed at [facility], 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

## 2017-04-05 NOTE — Plan of Care (Signed)
Reviewed plan of care with pt, understanding was verbalized.

## 2017-04-05 NOTE — Progress Notes (Signed)
Patient ID: [name] [name], female   DOB: 1917-06-18, 84 y.o.   MRN: [id number]    PROGRESS NOTE  [name] [name]  [name] DOB: 13-Jun-1917 DOA: 04/01/2017  PCP: Reynolds Bowl, MD   Brief Narrative:  Pt is 84 yo female with who underwent right hip arthroplasty 02/15/2017 for displaced femoral neck fracture and now presented for evaluation and management of wound dehiscence. She underwent I&D of right hip wound and today is post op day #3. Pt has done well post op but blood work notable for progressively worsening renal function and TRH asked further evaluate. Pt currently reports pain is controlled, she denies any abd or urinary concerns, she is eating breakfast and says she is tolerating it well. No fevers, chills, no chest pain or dyspnea.  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 Coli - pt was on Vancomycin and Rifampin but per ID, now changed to Levaquin  - per ID, will wait on PICC line for now until sensitivity report back   Active Problems:   Anemia - slight drop in Hg noted and could be post op vs dilutional component - currently Hg stable with no evidence of active bleeding - CBC in AM    Acute kidney injury - suspect pre renal etiology and possible progression to ATN - not clear if related to vancomycin  - FENa 6%, so also component of ATN  - renal US with evidence of chronic medical renal disease with no obstruction - Cr is trending down - will continue to monitor   DVT prophylaxis: SCD's Code Status: Full  Family Communication: Patient at bedside  Disposition Plan: per primary team   Consultants:   TRH   ID  Antimicrobials:   Levaquin    Subjective: No events overnight.   Objective: Vitals:   04/04/17 0403 04/04/17 1415 04/04/17 2054 04/05/17 0620  BP: 104/80 110/78 130/86 (!) 145/80  Pulse: 76 79 88 95  Resp: 15 16 15 18   Temp: 97.8 F (36.6 C) 97.7 F (36.5 C) 97.6 F  (36.4 C) 97.9 F (36.6 C)  TempSrc: Oral Oral Oral Oral  SpO2: 96% 97% 96% 98%  Weight:      Height:        Intake/Output Summary (Last 24 hours) at 04/05/2017 0941 Last data filed at 04/05/2017 0926 Gross per 24 hour  Intake 570 ml  Output 2735 ml  Net -2165 ml   Filed Weights   04/01/17 1500  Weight: 56.7 kg (125 lb)    Examination:  General exam: Appears calm and comfortable  Respiratory system: Clear to auscultation. Respiratory effort normal. Cardiovascular system: S1 & S2 heard, RRR. No JVD, murmurs, rubs, gallops or clicks. No pedal edema. Gastrointestinal system: Abdomen is nondistended, soft and nontender. No organomegaly or masses felt. Normal bowel sounds heard.  Data Reviewed: I have personally reviewed following labs and imaging studies  CBC: Recent Labs  Lab 04/01/17 1455 04/01/17 2111 04/02/17 0534 04/03/17 0521 04/04/17 0510 04/05/17 0555  WBC 8.1  --  8.7 6.9 9.9 10.1  NEUTROABS 4.6  --   --   --   --   --   HGB 9.6* 7.9* 7.8* 9.6* 8.7* 9.4*  HCT 30.1* 24.2* 24.0* 29.3* 25.9* 29.3*  MCV 87.0  --  87.9 84.4 84.4 86.2  PLT 272  --  216 181 179 198   Basic Metabolic Panel: Recent Labs  Lab 04/01/17 1455  04/02/17 0534 04/03/17 0521 04/04/17 0510 04/05/17 0555  NA 141 140 142 140 144  K 3.3* 4.1 4.7 4.1 3.9  CL 105 104 107 111 114*  CO2 24 25 23  21* 20*  GLUCOSE 114* 137* 168* 90 100*  BUN 25* 26* 38* 42* 33*  CREATININE 0.92 1.45* 1.77* 1.94* 1.45*  CALCIUM 9.1 8.5* 8.1* 7.3* 7.9*   CBG: Recent Labs  Lab 04/01/17 1451 04/01/17 2034  GLUCAP 97 155*   Urine analysis:    Component Value Date/Time   COLORURINE YELLOW 02/19/2017 1841   APPEARANCEUR TURBID (A) 02/19/2017 1841   LABSPEC 1.010 02/19/2017 1841   PHURINE 6.0 02/19/2017 1841   GLUCOSEU NEGATIVE 02/19/2017 1841   HGBUR LARGE (A) 02/19/2017 1841   BILIRUBINUR NEGATIVE 02/19/2017 1841   KETONESUR NEGATIVE 02/19/2017 1841   PROTEINUR 30 (A) 02/19/2017 1841   UROBILINOGEN  0.2 03/09/2011 1613   NITRITE NEGATIVE 02/19/2017 1841   LEUKOCYTESUR LARGE (A) 02/19/2017 1841   Recent Results (from the past 240 hour(s))  Anaerobic culture     Status: None (Preliminary result)   Collection Time: 04/01/17  5:51 PM  Result Value Ref Range Status   Specimen Description   Final    TISSUE FATTY Performed at [facility], [address] [place], [place] [address]    Special Requests   Final    NONE Performed at [facility], [address] [place], [place] [address]    Culture   Final    NO ANAEROBES ISOLATED; CULTURE IN PROGRESS FOR 5 DAYS   Report Status PENDING  Incomplete  Aerobic Culture (superficial specimen)     Status: None   Collection Time: 04/01/17  5:51 PM  Result Value Ref Range Status   Specimen Description   Final    TISSUE FATTY Performed at [facility], [address] [place], [place] [address]    Special Requests   Final    NONE Performed at [facility], [address], [place], [place] [address]    Gram Stain NO WBC SEEN NO ORGANISMS SEEN   Final   Culture   Final    RARE ESCHERICHIA COLI CRITICAL RESULT CALLED TO, READ BACK BY AND VERIFIED WITH: E HABIB,RN AT 1325 04/03/17 BY L BENFIELD CONCERNING GROWTH ON CULTURE Performed at [facility], [address] [place], [place] [address]    Report Status 04/04/2017 FINAL  Final   Organism ID, Bacteria ESCHERICHIA COLI  Final      Susceptibility   Escherichia coli - MIC*    AMPICILLIN <=2 SENSITIVE Sensitive     CEFAZOLIN <=4 SENSITIVE Sensitive     CEFEPIME <=1 SENSITIVE Sensitive     CEFTAZIDIME <=1 SENSITIVE Sensitive     CEFTRIAXONE <=1 SENSITIVE Sensitive     CIPROFLOXACIN <=0.25 SENSITIVE Sensitive     GENTAMICIN <=1 SENSITIVE Sensitive     IMIPENEM <=0.25 SENSITIVE Sensitive     TRIMETH/SULFA <=20 SENSITIVE Sensitive     AMPICILLIN/SULBACTAM <=2 SENSITIVE Sensitive     PIP/TAZO <=4 SENSITIVE  Sensitive     Extended ESBL NEGATIVE Sensitive     * RARE ESCHERICHIA COLI  Aerobic/Anaerobic Culture (surgical/deep wound)     Status: None (Preliminary result)   Collection Time: 04/01/17  6:10 PM  Result Value Ref Range Status   Specimen Description   Final    SYNOVIAL JOINT Performed at [facility], [address] [place], [place] [address]  Special Requests   Final    NONE Performed at [facility], [address] [place], [place] [address]    Gram Stain   Final    ABUNDANT WBC PRESENT,BOTH PMN AND MONONUCLEAR NO ORGANISMS SEEN    Culture   Final    NO GROWTH 3 DAYS NO ANAEROBES ISOLATED; CULTURE IN PROGRESS FOR 5 DAYS Performed at [facility], [address] [place], [place] [address]    Report Status PENDING  Incomplete  Aerobic/Anaerobic Culture (surgical/deep wound)     Status: None (Preliminary result)   Collection Time: 04/01/17  6:32 PM  Result Value Ref Range Status   Specimen Description   Final    HIP RIGHT PSEUDO CAPSULE Performed at [facility], [address] [place], [place] [address]    Special Requests   Final    NONE Performed at [facility], [address] [place], [place] [address]    Gram Stain   Final    RARE WBC PRESENT, PREDOMINANTLY MONONUCLEAR NO ORGANISMS SEEN    Culture   Final    NO GROWTH 2 DAYS NO ANAEROBES ISOLATED; CULTURE IN PROGRESS FOR 5 DAYS Performed at [facility], [address] [place], [place] [address]    Report Status PENDING  Incomplete  Aerobic/Anaerobic Culture (surgical/deep wound)     Status: None (Preliminary result)   Collection Time: 04/01/17  6:33 PM  Result Value Ref Range Status   Specimen Description   Final    HIP RIGHT PSEUDO CAPSULE 2 Performed at [facility], [address] [place], [place] [address]    Special Requests   Final    NONE Performed at [facility],  [address] [place], [place] [address]    Gram Stain   Final    RARE WBC PRESENT, PREDOMINANTLY MONONUCLEAR NO ORGANISMS SEEN    Culture   Final    NO GROWTH 2 DAYS NO ANAEROBES ISOLATED; CULTURE IN PROGRESS FOR 5 DAYS Performed at [facility], [address] [place], [place] [address]    Report Status PENDING  Incomplete    Radiology Studies: Koreas Renal  Result Date: 04/04/2017 CLINICAL DATA:  Acute renal failure on chronic kidney disease history of hypertension EXAM: RENAL / URINARY TRACT ULTRASOUND COMPLETE COMPARISON:  None. FINDINGS: Right Kidney: Length: 8.6 cm. Increased echogenicity. No hydronephrosis or focal abnormality Left Kidney: Length: 8.8 cm. Increased echogenicity. No hydronephrosis. Cyst in the upper pole measuring 2.8 x 3.1 x 2.9 cm Bladder: Foley catheter in the bladder IMPRESSION: 1. Echogenic kidneys consistent with medical renal disease. No hydronephrosis 2. Cyst in the left kidney Electronically Signed   By: Jasmine PangKim  Fujinaga M.D.   On: 04/04/2017 21:48    Scheduled Meds: . allopurinol  200 mg Oral QHS  . aspirin  81 mg Oral BID  . atorvastatin  10 mg Oral QHS  . clopidogrel  75 mg Oral Daily  . docusate sodium  100 mg Oral BID  . famotidine  20 mg Oral Daily  . fesoterodine  4 mg Oral Daily  . levofloxacin  250 mg Oral Q48H  . loratadine  10 mg Oral Daily  . senna  2 tablet Oral QHS  . torsemide  5 mg Oral Daily   Continuous Infusions: . sodium chloride 75 mL/hr at 04/04/17 2202  . methocarbamol (ROBAXIN)  IV       LOS: 4 days   Time  spent: 15 minutes   Debbora Presto, MD [facility] Pager [phone]  If 7PM-7AM, please contact night-coverage [email or web address] Password [facility] 04/05/2017, 9:41 AM

## 2017-04-05 NOTE — Progress Notes (Signed)
Initial Nutrition Assessment  DOCUMENTATION CODES:   Not applicable  INTERVENTION:   Ensure Enlive po BID, each supplement provides 350 kcal and 20 grams of protein  NUTRITION DIAGNOSIS:   Increased nutrient needs related to wound healing as evidenced by estimated needs  GOAL:   Patient will meet greater than or equal to 90% of their needs  MONITOR:   PO intake, Supplement acceptance, Labs, Weight trends, I & O's, Skin  REASON FOR ASSESSMENT:   Consult Hip fracture protocol, Assessment of nutrition requirement/status  ASSESSMENT:   Pt with PMH of HTN, HLD, type II DM, gout, displaced femoral neck fracture s/p R hemiarthroplasty on 02/15/17 presents with wound dehiscence.   Pt s/p debridement and head ball exchange of R hip on 04/01/17. Pt with Wound Vac, output 35 mL yesterday.   Pt unable to elaborate on RD questions at visit. SLP evaluated pt 02/17 recommended dysphagia 2 with thin liquids. Pt reports a good appetite.  Per meal completion records, pt consuming average of ~50% meals since admission.  Weight history in chart is limited for past year.   Labs reviewed; Hemoglobin 9.4 Medications reviewed; Colace, Pepcid, Senokot   NUTRITION - FOCUSED PHYSICAL EXAM:    Most Recent Value  Orbital Region  No depletion  Upper Arm Region  No depletion  Thoracic and Lumbar Region  No depletion  Buccal Region  Mild depletion  Temple Region  Mild depletion  Clavicle Bone Region  Moderate depletion  Clavicle and Acromion Bone Region  Mild depletion  Scapular Bone Region  Unable to assess  Dorsal Hand  Mild depletion  Patellar Region  No depletion  Anterior Thigh Region  No depletion  Posterior Calf Region  Unable to assess  Edema (RD Assessment)  None      Diet Order:  DIET DYS 2 Room service appropriate? Yes; Fluid consistency: Thin  EDUCATION NEEDS:   Not appropriate for education at this time  Skin:  Skin Assessment: Reviewed RN Assessment  Last BM:   04/04/17  Height:   Ht Readings from Last 1 Encounters:  04/01/17 4\' 10"  (1.473 m)   Weight:   Wt Readings from Last 1 Encounters:  04/01/17 125 lb (56.7 kg)   Ideal Body Weight:  43.9 kg  BMI:  Body mass index is 26.13 kg/m.  Estimated Nutritional Needs:   Kcal:  1300-1500  Protein:  70-80 grams  Fluid:  >/= 1.5 L/d  Fransisca KaufmannAllison Ioannides, MS, RDN, LDN 04/05/2017 12:36 PM

## 2017-04-05 NOTE — Progress Notes (Signed)
Physical Therapy Treatment Patient Details Name: [name] [name] MRN: [id number] DOB: 12/05/1917 Today's Date: 04/05/2017    History of Present Illness Pt is a 84 y/o female who presents 2/14 from SNF rehab with drainage of recent  R hip hemiarthroplasty (anterior approach) and is WBAT - No active abduction allowed.    PT Comments    POD # 4 Assisted OOB required + 2 assist.  Attempted amb however pt was unable to support self due to "bad right knee' which buckled with each attempted step.  Recliner brought to pt from behind.   Follow Up Recommendations  SNF     Equipment Recommendations  None recommended by PT    Recommendations for Other Services       Precautions / Restrictions Precautions Precautions: Fall Precaution Comments: no active abduction Restrictions Weight Bearing Restrictions: No    Mobility  Bed Mobility Overal bed mobility: Needs Assistance Bed Mobility: Supine to Sit     Supine to sit: Total assist;+2 for physical assistance;+2 for safety/equipment     General bed mobility comments: utilized bed pad to complete scooting to EOB   Transfers Overall transfer level: Needs assistance Equipment used: Rolling walker (2 wheeled) Transfers: Sit to/from Stand Sit to Stand: Max assist;+2 physical assistance;+2 safety/equipment;From elevated surface         General transfer comment: assist to rise and steady at RW, practiced x 2.     Ambulation/Gait Ambulation/Gait assistance: Max assist;Total assist;+2 physical assistance;+2 safety/equipment Ambulation Distance (Feet): 2 Feet Assistive device: Rolling walker (2 wheeled) Gait Pattern/deviations: Step-to pattern;Decreased stance time - right Gait velocity: decreased   General Gait Details: attempted forward gait with great difficulty mainly due to "bad Right knee".  R knee buckled with each attempt to stand.  Recliner brought to pt from behind.    Stairs            Wheelchair Mobility     Modified Rankin (Stroke Patients Only)       Balance                                            Cognition Arousal/Alertness: Awake/alert Behavior During Therapy: WFL for tasks assessed/performed Overall Cognitive Status: History of cognitive impairments - at baseline                                 General Comments: pleasant      Exercises      General Comments        Pertinent Vitals/Pain Pain Assessment: Faces Faces Pain Scale: Hurts little more Pain Location: R hip Pain Descriptors / Indicators: Tender;Sore Pain Intervention(s): Monitored during session;Repositioned;Premedicated before session;Ice applied    Home Living                      Prior Function            PT Goals (current goals can now be found in the care plan section) Progress towards PT goals: Progressing toward goals    Frequency    Min 2X/week      PT Plan Current plan remains appropriate    Co-evaluation              AM-PAC PT "6 Clicks" Daily Activity  Outcome Measure  Difficulty turning over in bed (including  adjusting bedclothes, sheets and blankets)?: Unable Difficulty moving from lying on back to sitting on the side of the bed? : Unable Difficulty sitting down on and standing up from a chair with arms (e.g., wheelchair, bedside commode, etc,.)?: Unable Help needed moving to and from a bed to chair (including a wheelchair)?: Total Help needed walking in hospital room?: Total Help needed climbing 3-5 steps with a railing? : Total 6 Click Score: 6    End of Session Equipment Utilized During Treatment: Gait belt Activity Tolerance: Patient limited by fatigue Patient left: in chair;with chair alarm set;with call bell/phone within reach Nurse Communication: Mobility status PT Visit Diagnosis: Unsteadiness on feet (R26.81)     Time: [id number] PT Time Calculation (min) (ACUTE ONLY): 24 min  Charges:  $Gait Training: 8-22  mins $Therapeutic Activity: 8-22 mins                    G Codes:       Felecia ShellingLori Kosta Schnitzler  PTA [facility]  Acute  Rehab Pager      [phone]

## 2017-04-05 NOTE — Progress Notes (Signed)
Physical Therapy Treatment Patient Details Name: Nonda LouUdell S Spratley MRN: [id number] DOB: April 19, 1917 Today's Date: 04/05/2017    History of Present Illness Pt is a 84 y/o female who presents 2/14 from SNF rehab with drainage of recent  R hip hemiarthroplasty (anterior approach) and is WBAT - No active abduction allowed.    PT Comments    POD # 4 pm session Assisted back to bed + 2 assist and positioned to comfort.  MAX serous bloody drainage coming from drain site RN already aware.    Follow Up Recommendations  SNF     Equipment Recommendations  None recommended by PT    Recommendations for Other Services       Precautions / Restrictions Precautions Precautions: Fall Precaution Comments: no active abduction Restrictions Weight Bearing Restrictions: No    Mobility  Bed Mobility Overal bed mobility: Needs Assistance Bed Mobility:  Sit to supine       sit:to supine  Total assist;+2 for physical assistance;+2 for safety/equipment     General bed mobility comments: utilized bed pad to complete scooting to EOB   Transfers Overall transfer level: Needs assistance Equipment used: Rolling walker (2 wheeled) Transfers: Sit to/from Stand Sit to Stand: Max assist;+2 physical assistance;+2 safety/equipment;From elevated surface         General transfer comment: assist to rise and steady at RW, practiced x 2.     Ambulation/Gait Stairs            Wheelchair Mobility    Modified Rankin (Stroke Patients Only)       Balance                                            Cognition Arousal/Alertness: Awake/alert Behavior During Therapy: WFL for tasks assessed/performed Overall Cognitive Status: History of cognitive impairments - at baseline                                 General Comments: pleasant      Exercises      General Comments        Pertinent Vitals/Pain Pain Assessment: Faces Faces Pain Scale: Hurts little  more Pain Location: R hip Pain Descriptors / Indicators: Tender;Sore Pain Intervention(s): Monitored during session;Repositioned;Premedicated before session;Ice applied    Home Living                      Prior Function            PT Goals (current goals can now be found in the care plan section) Progress towards PT goals: Progressing toward goals    Frequency    Min 2X/week      PT Plan Current plan remains appropriate    Co-evaluation              AM-PAC PT "6 Clicks" Daily Activity  Outcome Measure  Difficulty turning over in bed (including adjusting bedclothes, sheets and blankets)?: Unable Difficulty moving from lying on back to sitting on the side of the bed? : Unable Difficulty sitting down on and standing up from a chair with arms (e.g., wheelchair, bedside commode, etc,.)?: Unable Help needed moving to and from a bed to chair (including a wheelchair)?: Total Help needed walking in hospital room?: Total Help needed climbing 3-5 steps with a railing? : Total  6 Click Score: 6    End of Session Equipment Utilized During Treatment: Gait belt Activity Tolerance: Patient limited by fatigue Patient left: in chair;with chair alarm set;with call bell/phone within reach Nurse Communication: Mobility status PT Visit Diagnosis: Unsteadiness on feet (R26.81)     Time: 1610-9604 PT Time Calculation (min) (ACUTE ONLY): 24 min  Charges:  $Therapeutic Activity: 23-37 mins                    G Codes:       Felecia Shelling  PTA [facility]  Acute  Rehab Pager      [phone]

## 2017-04-05 NOTE — Care Management Important Message (Signed)
Important Message  Patient Details  Name: [name] [name] MRN: [id number] Date of Birth: 08-Sep-1917   Medicare Important Message Given:  Yes    Caren MacadamFuller, Wilmot Quevedo 04/05/2017, 10:29 AMImportant Message  Patient Details  Name: [name] [name] MRN: [id number] Date of Birth: 08-Sep-1917   Medicare Important Message Given:  Yes    Caren MacadamFuller, Yari Szeliga 04/05/2017, 10:29 AM

## 2017-04-06 ENCOUNTER — Inpatient Hospital Stay (HOSPITAL_COMMUNITY): Payer: Medicare Other

## 2017-04-06 LAB — CBC
HCT: 29.2 % — ABNORMAL LOW (ref 36.0–46.0)
Hemoglobin: 9.5 g/dL — ABNORMAL LOW (ref 12.0–15.0)
MCH: 28 pg (ref 26.0–34.0)
MCHC: 32.5 g/dL (ref 30.0–36.0)
MCV: 86.1 fL (ref 78.0–100.0)
PLATELETS: 209 10*3/uL (ref 150–400)
RBC: 3.39 MIL/uL — AB (ref 3.87–5.11)
RDW: 18.7 % — ABNORMAL HIGH (ref 11.5–15.5)
WBC: 8.4 10*3/uL (ref 4.0–10.5)

## 2017-04-06 LAB — BASIC METABOLIC PANEL
Anion gap: 9 (ref 5–15)
BUN: 25 mg/dL — AB (ref 6–20)
CO2: 20 mmol/L — ABNORMAL LOW (ref 22–32)
Calcium: 7.8 mg/dL — ABNORMAL LOW (ref 8.9–10.3)
Chloride: 114 mmol/L — ABNORMAL HIGH (ref 101–111)
Creatinine, Ser: 1.11 mg/dL — ABNORMAL HIGH (ref 0.44–1.00)
GFR calc Af Amer: 47 mL/min — ABNORMAL LOW (ref >60)
GFR, EST NON AFRICAN AMERICAN: 40 mL/min — AB (ref >60)
Glucose, Bld: 118 mg/dL — ABNORMAL HIGH (ref 65–99)
POTASSIUM: 3.6 mmol/L (ref 3.5–5.1)
SODIUM: 143 mmol/L (ref 135–145)

## 2017-04-06 LAB — ANAEROBIC CULTURE: Culture: NO GROWTH

## 2017-04-06 MED ORDER — TORSEMIDE 10 MG PO TABS
10.0000 mg | ORAL_TABLET | Freq: Two times a day (BID) | ORAL | Status: DC
  Administered 2017-04-07: 10:00:00 10 mg via ORAL
  Filled 2017-04-06 (×2): qty 1

## 2017-04-06 MED ORDER — LEVOFLOXACIN 500 MG PO TABS
250.0000 mg | ORAL_TABLET | Freq: Every day | ORAL | Status: DC
  Administered 2017-04-06 – 2017-04-07 (×2): 250 mg via ORAL
  Filled 2017-04-06 (×2): qty 1

## 2017-04-06 MED ORDER — FUROSEMIDE 10 MG/ML IJ SOLN: 20.0000 mg | Freq: Once | INTRAMUSCULAR | Status: DC

## 2017-04-06 NOTE — Progress Notes (Signed)
  Speech Language Pathology Treatment: Dysphagia  Patient Details Name: [name] [name] MRN: [id number] DOB: 1917/04/03 Today's Date: 04/06/2017 Time: [id number] SLP Time Calculation (min) (ACUTE ONLY): 16 min  Assessment / Plan / Recommendation Clinical Impression  SLP assisted pt in donning her dentures - family had brought in her adhesive, and they appear to fit more securely than as described in initial evaluation. She and her family both share that she was eating more advanced diet textures PTA, although pt also says that she cooked her food even more tender than what is being served in the hospital. She consumed advanced solid trials with extra time and Min cues for adequate clearance. One immediate cough was noted as pt tried to talk with food still in her mouth. Recommend a trial of Dys 3 textures and thin liquids, which may be more consistent with what pt was eating PTA. SLP will f/u briefly for tolerance.   HPI HPI: Pt is 84 yo female with history of CVA (2013), DM, CKD, who underwent right hip arthroplasty 02/15/2017 for displaced femoral neck fracture and presented for evaluation and management of wound dehiscence. She underwent I&D of right hip wound. Referred for swallowing evaluation.       SLP Plan  Continue with current plan of care       Recommendations  Diet recommendations: Dysphagia 3 (mechanical soft);Thin liquid Liquids provided via: Cup;Straw Medication Administration: Crushed with puree Supervision: Patient able to self feed;Intermittent supervision to cue for compensatory strategies Compensations: Slow rate;Small sips/bites;Lingual sweep for clearance of pocketing;Follow solids with liquid Postural Changes and/or Swallow Maneuvers: Seated upright 90 degrees                Oral Care Recommendations: Oral care BID Follow up Recommendations: (tba) SLP Visit Diagnosis: Dysphagia, oral phase (R13.11) Plan: Continue with current plan of care       GO                 Maxcine Hamaiewonsky, Mitzi Lilja 04/06/2017, 4:10 PM  Maxcine HamLaura Paiewonsky, M.A. CCC-SLP [phone]

## 2017-04-06 NOTE — Progress Notes (Signed)
Patient to [facility] [place] at discharge with Prevena Wound Vac.  Admissions Coordinator aware and ready to admit.  CSW will continue to assist with d/c   Earnestine LeysNicole Landers Prajapati, LCSWA, MSW Clinical Social Worker  [phone] 04/06/2017  12:24 PM

## 2017-04-06 NOTE — Progress Notes (Signed)
PHARMACY NOTE:  ANTIMICROBIAL RENAL DOSAGE ADJUSTMENT  Current antimicrobial regimen includes a mismatch between antimicrobial dosage and estimated renal function.  As per policy approved by the Pharmacy & Therapeutics and Medical Executive Committees, the antimicrobial dosage will be adjusted accordingly.  Current antimicrobial dosage:  levaquin 250 mg q48h  Indication: infected right prosthetic hip joint  Renal Function:  Estimated Creatinine Clearance: 21.6 mL/min (A) (by C-G formula based on SCr of 1.11 mg/dL (H)). []      On intermittent HD, scheduled: []      On CRRT    Antimicrobial dosage has been changed to:  250 mg daily  Thank you for allowing pharmacy to be a part of this patient's care.  Lucia Gaskinsham, Yaelis Scharfenberg P, [facility]PH 04/06/2017 9:41 AM

## 2017-04-06 NOTE — Progress Notes (Signed)
Patient ID: [name] [name], female   DOB: [birth year], 84 y.o.   MRN: [id number]          [facility]    Date of Admission:  04/01/2017   Total days of antibiotics 6        Day 4 levofloxacin  Rare E. coli were isolated from the superficial fatty tissue of her right hip.  Deep cultures remain negative.  I plan on continuing levofloxacin for 28 days then converting to a longer course of oral trimethoprim sulfamethoxazole for presumed deep infection around hardware.         Cliffton AstersJohn Tyri Elmore, MD [facility] [phone] pager   [phone] cell 04/06/2017, 3:02 PM

## 2017-04-06 NOTE — Progress Notes (Signed)
Subjective:  Patient reports pain as mild to moderate.  Denies N/V/CP/SOB. Knee feels great.  Objective:   VITALS:   Vitals:   04/05/17 1515 04/05/17 2244 04/06/17 0628 04/06/17 1356  BP: 129/62 (!) 147/63 (!) 132/53 (!) 142/62  Pulse: 96 (!) 101 88 91  Resp: 14 15 15 15   Temp: 97.8 F (36.6 C) 97.8 F (36.6 C) 97.8 F (36.6 C) 97.7 F (36.5 C)  TempSrc: Oral Oral Oral Oral  SpO2: 95% 97% 100% 99%  Weight:      Height:        NAD ABD soft Sensation intact distally Intact pulses distally Dorsiflexion/Plantar flexion intact Incision: dressing C/D/I Compartment soft prevena intact  Lab Results  Component Value Date   WBC 8.4 04/06/2017   HGB 9.5 (L) 04/06/2017   HCT 29.2 (L) 04/06/2017   MCV 86.1 04/06/2017   PLT 209 04/06/2017   BMET    Component Value Date/Time   NA 143 04/06/2017 0530   K 3.6 04/06/2017 0530   CL 114 (H) 04/06/2017 0530   CO2 20 (L) 04/06/2017 0530   GLUCOSE 118 (H) 04/06/2017 0530   BUN 25 (H) 04/06/2017 0530   CREATININE 1.11 (H) 04/06/2017 0530   CALCIUM 7.8 (L) 04/06/2017 0530   GFRNONAA 40 (L) 04/06/2017 0530   GFRAA 47 (L) 04/06/2017 0530    Results for orders placed or performed during the hospital encounter of 04/01/17  Anaerobic culture     Status: None   Collection Time: 04/01/17  5:51 PM  Result Value Ref Range Status   Specimen Description   Final    TISSUE FATTY Performed at [facility], [address] [place], [place] [address]    Special Requests   Final    NONE Performed at [facility], [address] [place], [place] [address]    Culture   Final    NO GROWTH 5 DAYS Performed at [facility], [address], [place], [place] [address]    Report Status 04/06/2017 FINAL  Final  Aerobic Culture (superficial specimen)     Status: None   Collection Time: 04/01/17  5:51 PM  Result Value Ref Range Status   Specimen Description   Final    TISSUE FATTY Performed  at [facility], [address], [place], [place] [address]    Special Requests   Final    NONE Performed at [facility], [address] [place], [place] [address]    Gram Stain NO WBC SEEN NO ORGANISMS SEEN   Final   Culture   Final    RARE ESCHERICHIA COLI CRITICAL RESULT CALLED TO, READ BACK BY AND VERIFIED WITH: E HABIB,RN AT 1325 04/03/17 BY L BENFIELD CONCERNING GROWTH ON CULTURE Performed at [facility], [address] [place], [place] [address]    Report Status 04/04/2017 FINAL  Final   Organism ID, Bacteria ESCHERICHIA COLI  Final      Susceptibility   Escherichia coli - MIC*    AMPICILLIN <=2 SENSITIVE Sensitive     CEFAZOLIN <=4 SENSITIVE Sensitive     CEFEPIME <=1 SENSITIVE Sensitive     CEFTAZIDIME <=1 SENSITIVE Sensitive     CEFTRIAXONE <=1 SENSITIVE Sensitive     CIPROFLOXACIN <=0.25 SENSITIVE Sensitive     GENTAMICIN <=1 SENSITIVE Sensitive     IMIPENEM <=0.25 SENSITIVE Sensitive     TRIMETH/SULFA <=20 SENSITIVE Sensitive     AMPICILLIN/SULBACTAM <=2  SENSITIVE Sensitive     PIP/TAZO <=4 SENSITIVE Sensitive     Extended ESBL NEGATIVE Sensitive     * RARE ESCHERICHIA COLI  Aerobic/Anaerobic Culture (surgical/deep wound)     Status: None (Preliminary result)   Collection Time: 04/01/17  6:10 PM  Result Value Ref Range Status   Specimen Description   Final    SYNOVIAL JOINT Performed at [facility], [address] [place], [place] [address]    Special Requests   Final    NONE Performed at [facility], [address] [place], [place] [address]    Gram Stain   Final    ABUNDANT WBC PRESENT,BOTH PMN AND MONONUCLEAR NO ORGANISMS SEEN    Culture   Final    NO GROWTH 5 DAYS Performed at [facility], [address] [place], [place] [address]    Report Status PENDING  Incomplete  Aerobic/Anaerobic Culture (surgical/deep wound)     Status: None (Preliminary result)    Collection Time: 04/01/17  6:32 PM  Result Value Ref Range Status   Specimen Description   Final    HIP RIGHT PSEUDO CAPSULE Performed at [facility], [address] [place], [place] [address]    Special Requests   Final    NONE Performed at [facility], [address] [place], [place] [address]    Gram Stain   Final    RARE WBC PRESENT, PREDOMINANTLY MONONUCLEAR NO ORGANISMS SEEN    Culture   Final    NO GROWTH 4 DAYS NO ANAEROBES ISOLATED; CULTURE IN PROGRESS FOR 5 DAYS Performed at [facility], [address] [place], [place] [address]    Report Status PENDING  Incomplete  Aerobic/Anaerobic Culture (surgical/deep wound)     Status: None (Preliminary result)   Collection Time: 04/01/17  6:33 PM  Result Value Ref Range Status   Specimen Description   Final    HIP RIGHT PSEUDO CAPSULE 2 Performed at [facility], [address] [place], [place] [address]    Special Requests   Final    NONE Performed at [facility], [address] [place], [place] [address]    Gram Stain   Final    RARE WBC PRESENT, PREDOMINANTLY MONONUCLEAR NO ORGANISMS SEEN    Culture   Final    NO GROWTH 4 DAYS NO ANAEROBES ISOLATED; CULTURE IN PROGRESS FOR 5 DAYS Performed at [facility], 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

## 2017-04-06 NOTE — Progress Notes (Addendum)
Patient ID: [name] [name], female   DOB: 01-12-1918, 84 y.o.   MRN: [id number]    PROGRESS NOTE  [name] [name]  [name] DOB: 1917-06-24 DOA: 04/01/2017  PCP: [name] Bowl, MD   Brief Narrative:  Pt is 84 yo female with who underwent right hip arthroplasty 02/15/2017 for displaced femoral neck fracture and now presented for evaluation and management of wound dehiscence. She underwent I&D of right hip wound and today is post op day #3. Pt has done well post op but blood work notable for progressively worsening renal function and TRH asked further evaluate. Pt currently reports pain is controlled, she denies any abd or urinary concerns, she is eating breakfast and says she is tolerating it well. No fevers, chills, no chest pain or dyspnea.  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 Coli - pt was on Vancomycin and Rifampin but per ID, now changed to Levaquin  - ABX per ID team  Active Problems:   Anemia - slight drop in Hg noted and could be post op vs dilutional component - Hg overall stable with no evidence of active bleeding     Acute kidney injury - suspect pre renal etiology and possible progression to ATN - not clear if related to vancomycin  - FENa 6%, so also component of ATN  - renal US with evidence of chronic medical renal disease with no obstruction - Cr is trending down and nearly WNL, will stop IVF  - no need for further work up     Cough - suspect from IVF, will stop IVF today - will ask for CXR and continue home regimen with Torsemide  - check weight, she was 125 lbs on admission  - will reassess in AM  DVT prophylaxis: SCD's Code Status: Full  Family Communication: no family at bedside  Disposition Plan: per primary team   Consultants:   TRH   ID  Antimicrobials:   Levaquin    Subjective: No events overnight.   Objective: Vitals:   04/05/17 1515 04/05/17 2244  04/06/17 0628 04/06/17 1356  BP: 129/62 (!) 147/63 (!) 132/53 (!) 142/62  Pulse: 96 (!) 101 88 91  Resp: 14 15 15 15   Temp: 97.8 F (36.6 C) 97.8 F (36.6 C) 97.8 F (36.6 C) 97.7 F (36.5 C)  TempSrc: Oral Oral Oral Oral  SpO2: 95% 97% 100% 99%  Weight:      Height:        Intake/Output Summary (Last 24 hours) at 04/06/2017 1411 Last data filed at 04/06/2017 1356 Gross per 24 hour  Intake 1506.25 ml  Output 1400 ml  Net 106.25 ml   Filed Weights   04/01/17 1500  Weight: 56.7 kg (125 lb)    Physical Exam  Constitutional: Appears calm, NAD CVS: RRR, S1/S2 +, no murmurs, no gallops, no carotid bruit.  Pulmonary: Effort and breath sounds normal, mild crackles at bases Abdominal: Soft. BS +,  no distension, tenderness, rebound or guarding.   Data Reviewed: I have personally reviewed following labs and imaging studies  CBC: Recent Labs  Lab 04/01/17 1455  04/02/17 0534 04/03/17 0521 04/04/17 0510 04/05/17 0555 04/06/17 0530  WBC 8.1  --  8.7 6.9 9.9 10.1 8.4  NEUTROABS 4.6  --   --   --   --   --   --   HGB 9.6*   < > 7.8* 9.6* 8.7* 9.4* 9.5*  HCT 30.1*   < > 24.0* 29.3* 25.9* 29.3* 29.2*  MCV 87.0  --  87.9 84.4 84.4 86.2 86.1  PLT 272  --  216 181 179 198 209   < > = values in this interval not displayed.   Basic Metabolic Panel: Recent Labs  Lab 04/02/17 0534 04/03/17 0521 04/04/17 0510 04/05/17 0555 04/06/17 0530  NA 140 142 140 144 143  K 4.1 4.7 4.1 3.9 3.6  CL 104 107 111 114* 114*  CO2 25 23 21* 20* 20*  GLUCOSE 137* 168* 90 100* 118*  BUN 26* 38* 42* 33* 25*  CREATININE 1.45* 1.77* 1.94* 1.45* 1.11*  CALCIUM 8.5* 8.1* 7.3* 7.9* 7.8*   CBG: Recent Labs  Lab 04/01/17 1451 04/01/17 2034  GLUCAP 97 155*   Urine analysis:    Component Value Date/Time   COLORURINE YELLOW 02/19/2017 1841   APPEARANCEUR TURBID (A) 02/19/2017 1841   LABSPEC 1.010 02/19/2017 1841   PHURINE 6.0 02/19/2017 1841   GLUCOSEU NEGATIVE 02/19/2017 1841   HGBUR LARGE  (A) 02/19/2017 1841   BILIRUBINUR NEGATIVE 02/19/2017 1841   KETONESUR NEGATIVE 02/19/2017 1841   PROTEINUR 30 (A) 02/19/2017 1841   UROBILINOGEN 0.2 03/09/2011 1613   NITRITE NEGATIVE 02/19/2017 1841   LEUKOCYTESUR LARGE (A) 02/19/2017 1841   Recent Results (from the past 240 hour(s))  Anaerobic culture     Status: None   Collection Time: 04/01/17  5:51 PM  Result Value Ref Range Status   Specimen Description   Final    TISSUE FATTY Performed at [facility], [address], [place], [place] [address]    Special Requests   Final    NONE Performed at [facility], [address], [place], [place] [address]    Culture   Final    NO GROWTH 5 DAYS Performed at [facility], [address] [place], [place] [address]    Report Status 04/06/2017 FINAL  Final  Aerobic Culture (superficial specimen)     Status: None   Collection Time: 04/01/17  5:51 PM  Result Value Ref Range Status   Specimen Description   Final    TISSUE FATTY Performed at [facility], [address], [place], [place] [address]    Special Requests   Final    NONE Performed at [facility], [address], [place], [place] [address]    Gram Stain NO WBC SEEN NO ORGANISMS SEEN   Final   Culture   Final    RARE ESCHERICHIA COLI CRITICAL RESULT CALLED TO, READ BACK BY AND VERIFIED WITH: E HABIB,RN AT 1325 04/03/17 BY L BENFIELD CONCERNING GROWTH ON CULTURE Performed at [facility], [address] [place], [place] [address]    Report Status 04/04/2017 FINAL  Final   Organism ID, Bacteria ESCHERICHIA COLI  Final      Susceptibility   Escherichia coli - MIC*    AMPICILLIN <=2 SENSITIVE Sensitive     CEFAZOLIN <=4 SENSITIVE Sensitive     CEFEPIME <=1 SENSITIVE Sensitive     CEFTAZIDIME <=1 SENSITIVE Sensitive     CEFTRIAXONE <=1 SENSITIVE Sensitive     CIPROFLOXACIN <=0.25 SENSITIVE Sensitive     GENTAMICIN <=1  SENSITIVE Sensitive     IMIPENEM <=0.25 SENSITIVE Sensitive     TRIMETH/SULFA <=20 SENSITIVE Sensitive     AMPICILLIN/SULBACTAM <=2 SENSITIVE Sensitive     PIP/TAZO <=4 SENSITIVE Sensitive     Extended ESBL NEGATIVE  Sensitive     * RARE ESCHERICHIA COLI  Aerobic/Anaerobic Culture (surgical/deep wound)     Status: None (Preliminary result)   Collection Time: 04/01/17  6:10 PM  Result Value Ref Range Status   Specimen Description   Final    SYNOVIAL JOINT Performed at [facility], [address] [place], [place] [address]    Special Requests   Final    NONE Performed at [facility], [address] [place], [place] [address]    Gram Stain   Final    ABUNDANT WBC PRESENT,BOTH PMN AND MONONUCLEAR NO ORGANISMS SEEN    Culture   Final    NO GROWTH 5 DAYS Performed at [facility], [address] [place], [place] [address]    Report Status PENDING  Incomplete  Aerobic/Anaerobic Culture (surgical/deep wound)     Status: None (Preliminary result)   Collection Time: 04/01/17  6:32 PM  Result Value Ref Range Status   Specimen Description   Final    HIP RIGHT PSEUDO CAPSULE Performed at [facility], [address] [place], [place] [address]    Special Requests   Final    NONE Performed at [facility], [address] [place], [place] [address]    Gram Stain   Final    RARE WBC PRESENT, PREDOMINANTLY MONONUCLEAR NO ORGANISMS SEEN    Culture   Final    NO GROWTH 4 DAYS NO ANAEROBES ISOLATED; CULTURE IN PROGRESS FOR 5 DAYS Performed at [facility], [address] [place], [place] [address]    Report Status PENDING  Incomplete  Aerobic/Anaerobic Culture (surgical/deep wound)     Status: None (Preliminary result)   Collection Time: 04/01/17  6:33 PM  Result Value Ref Range Status   Specimen Description   Final    HIP RIGHT PSEUDO CAPSULE 2 Performed at [facility],  [address] [place], [place] [address]    Special Requests   Final    NONE Performed at [facility], [address] [place], [place] [address]    Gram Stain   Final    RARE WBC PRESENT, PREDOMINANTLY MONONUCLEAR NO ORGANISMS SEEN    Culture   Final    NO GROWTH 4 DAYS NO ANAEROBES ISOLATED; CULTURE IN PROGRESS FOR 5 DAYS Performed at [facility], [address] [place], [place] [address]    Report Status PENDING  Incomplete    Radiology Studies: [place]as Renal  Result Date: 04/04/2017 CLINICAL DATA:  Acute renal failure on chronic kidney disease history of hypertension EXAM: RENAL / URINARY TRACT ULTRASOUND COMPLETE COMPARISON:  None. FINDINGS: Right Kidney: Length: 8.6 cm. Increased echogenicity. No hydronephrosis or focal abnormality Left Kidney: Length: 8.8 cm. Increased echogenicity. No hydronephrosis. Cyst in the upper pole measuring 2.8 x 3.1 x 2.9 cm Bladder: Foley catheter in the bladder IMPRESSION: 1. Echogenic kidneys consistent with medical renal disease. No hydronephrosis 2. Cyst in the left kidney Electronically Signed   By: Jasmine PangKim  Fujinaga M.D.   On: 04/04/2017 21:48    Scheduled Meds: . allopurinol  200 mg Oral QHS  . aspirin  81 mg Oral BID  . atorvastatin  10 mg Oral QHS  . clopidogrel  75 mg Oral Daily  . docusate sodium  100 mg Oral BID  . famotidine  20 mg Oral Daily  . feeding supplement (ENSURE ENLIVE)  237 mL Oral BID BM  . fesoterodine  4 mg Oral Daily  . levofloxacin  250 mg Oral Daily  . loratadine  10 mg Oral Daily  . senna  2 tablet Oral QHS  . torsemide  5 mg Oral Daily   Continuous Infusions: . sodium chloride Stopped (04/06/17 0629)  . methocarbamol (ROBAXIN)  IV       LOS: 5 days   Time spent: 15 minutes  Debbora Presto, MD [facility] Pager [phone]  If 7PM-7AM, please contact night-coverage [email or web address] Password [facility] 04/06/2017, 2:11 PM

## 2017-04-07 ENCOUNTER — Encounter: Payer: Medicare Other | Attending: Physical Medicine & Rehabilitation | Admitting: Physical Medicine & Rehabilitation

## 2017-04-07 DIAGNOSIS — D649 Anemia, unspecified: Secondary | ICD-10-CM

## 2017-04-07 DIAGNOSIS — R059 Cough, unspecified: Secondary | ICD-10-CM

## 2017-04-07 DIAGNOSIS — Z978 Presence of other specified devices: Secondary | ICD-10-CM

## 2017-04-07 DIAGNOSIS — R05 Cough: Secondary | ICD-10-CM

## 2017-04-07 DIAGNOSIS — N179 Acute kidney failure, unspecified: Secondary | ICD-10-CM

## 2017-04-07 DIAGNOSIS — J90 Pleural effusion, not elsewhere classified: Secondary | ICD-10-CM

## 2017-04-07 DIAGNOSIS — T8132XD Disruption of internal operation (surgical) wound, not elsewhere classified, subsequent encounter: Secondary | ICD-10-CM

## 2017-04-07 LAB — BASIC METABOLIC PANEL
ANION GAP: 8 (ref 5–15)
BUN: 22 mg/dL — ABNORMAL HIGH (ref 6–20)
CALCIUM: 8.1 mg/dL — AB (ref 8.9–10.3)
CO2: 21 mmol/L — ABNORMAL LOW (ref 22–32)
Chloride: 116 mmol/L — ABNORMAL HIGH (ref 101–111)
Creatinine, Ser: 1 mg/dL (ref 0.44–1.00)
GFR calc Af Amer: 53 mL/min — ABNORMAL LOW (ref >60)
GFR, EST NON AFRICAN AMERICAN: 46 mL/min — AB (ref >60)
Glucose, Bld: 115 mg/dL — ABNORMAL HIGH (ref 65–99)
POTASSIUM: 3.6 mmol/L (ref 3.5–5.1)
SODIUM: 145 mmol/L (ref 135–145)

## 2017-04-07 LAB — AEROBIC/ANAEROBIC CULTURE W GRAM STAIN (SURGICAL/DEEP WOUND): Culture: NO GROWTH

## 2017-04-07 LAB — AEROBIC/ANAEROBIC CULTURE (SURGICAL/DEEP WOUND)
CULTURE: NO GROWTH
CULTURE: NO GROWTH

## 2017-04-07 MED ORDER — HYDROCODONE-ACETAMINOPHEN 5-325 MG PO TABS: 1.0000 | ORAL_TABLET | Freq: Four times a day (QID) | ORAL | 0 refills | Status: DC | PRN

## 2017-04-07 MED ORDER — LEVOFLOXACIN 250 MG PO TABS: 250.0000 mg | ORAL_TABLET | Freq: Every day | ORAL | 1 refills | Status: DC

## 2017-04-07 MED ORDER — ONDANSETRON HCL 4 MG PO TABS: 4.0000 mg | ORAL_TABLET | Freq: Four times a day (QID) | ORAL | 0 refills | Status: AC | PRN

## 2017-04-07 MED ORDER — ACETAMINOPHEN 325 MG PO TABS: 650.0000 mg | ORAL_TABLET | ORAL | 0 refills | Status: AC | PRN

## 2017-04-07 MED ORDER — ENSURE ENLIVE PO LIQD: 237.0000 mL | Freq: Two times a day (BID) | ORAL | 12 refills | Status: AC

## 2017-04-07 MED ORDER — GUAIFENESIN ER 600 MG PO TB12
600.0000 mg | ORAL_TABLET | Freq: Two times a day (BID) | ORAL | Status: DC
  Administered 2017-04-07: 600 mg via ORAL
  Filled 2017-04-07: qty 1

## 2017-04-07 NOTE — Progress Notes (Addendum)
Physician Discharge Summary  Patient ID: [name] [name] MRN: [id number] DOB/AGE: 10/23/1917 84 y.o.  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 Anemia    blood transfusion during hospitalization of 02/15/2017   . Arthritis   . Chronic kidney disease    CKD- stage III  . Constipation   . Diabetes mellitus type II 02/19/2011   diet controlled   . Dizziness   . E. coli urinary tract infection   . Gout   . Gout spondylitis   . 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 History of dizziness   . History of gout   . Hyperlipidemia   . Hypertension   . Overactive bladder   . Sciatica   . Stroke [facility](HCC)     Surgeries: Procedure(s): DEBRIDEMENT OF RIGHT HIP,  HEAD BALL EXCHANGE on 04/01/2017   Consultants (if any): Treatment Team:  Dorothea OgleMyers, Iskra M, MD  Discharged Condition: Improved  Hospital Course: [name] [name] is an 84 y.o. 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    She was given perioperative antibiotics:  Anti-infectives (From admission, onward)   Start     Dose/Rate Route Frequency Ordered Stop   04/08/17 0000  levofloxacin (LEVAQUIN) 250 MG tablet     250 mg Oral Daily 04/07/17 1304     04/06/17 1000  levofloxacin (LEVAQUIN) tablet 250 mg     250 mg Oral Daily 04/06/17 0944     04/05/17 1000  levofloxacin (LEVAQUIN) tablet 250 mg  Status:  Discontinued     250 mg Oral Every 48 hours 04/03/17 1200 04/06/17 0944   04/03/17 1845   vancomycin (VANCOCIN) IVPB 1000 mg/200 mL premix  Status:  Discontinued     1,000 mg 200 mL/hr over 60 Minutes Intravenous  Once 04/03/17 1830 04/03/17 1905   04/02/17 1730  levofloxacin (LEVAQUIN) tablet 250 mg  Status:  Discontinued     250 mg Oral Daily 04/02/17 1652 04/03/17 1200   04/02/17 1530  rifampin (RIFADIN) capsule 300 mg  Status:  Discontinued     300 mg Oral Every 12 hours 04/02/17 1522 04/03/17 1912   04/02/17 0000  clindamycin (CLEOCIN) IVPB 600 mg  Status:  Discontinued     600 mg 100 mL/hr over 30 Minutes Intravenous Every 6 hours 04/01/17 2200 04/02/17 1649   04/01/17 1835  polymyxin B 500,000 Units, bacitracin 50,000 Units in sodium chloride 0.9 % 500 mL irrigation  Status:  Discontinued       As needed 04/01/17 1835 04/01/17 2027   04/01/17 1445  vancomycin (VANCOCIN) IVPB 1000 mg/200 mL premix     1,000 mg 200 mL/hr over 60 Minutes Intravenous On call to O.R. 04/01/17 1439 04/01/17 1719   04/01/17 1445  clindamycin (CLEOCIN) IVPB 900 mg     900 mg 100 mL/hr over 30 Minutes Intravenous On call to O.R. 04/01/17 1440 04/01/17 1745    .  She was given sequential compression devices, early ambulation, and ASA + plavix for DVT  prophylaxis.  She benefited maximally from the hospital stay and there were no complications.    Deep cultures were negative; superficial cultures grew E. Coli. ID was consulted, and recommended PO levaquin.  She required PRBCs for ABLA.  The hospitalist was consulted for AoCKD which improved with hydration.  Recent vital signs:  Vitals:   04/07/17 1313 04/07/17 1317  BP: (!) 147/59 (!) 148/63  Pulse: 79 94  Resp: 16 16  Temp: 97.7 F (36.5 C) (!) 97.5 F (36.4 C)  SpO2: 98% 95%    Recent laboratory studies:  Lab Results  Component Value Date   HGB 9.5 (L) 04/06/2017   HGB 9.4 (L) 04/05/2017   HGB 8.7 (L) 04/04/2017   Lab Results  Component Value Date   WBC 8.4 04/06/2017   PLT 209 04/06/2017   Lab Results  Component Value  Date   INR 1.36 03/09/2011   Lab Results  Component Value Date   NA 145 04/07/2017   K 3.6 04/07/2017   CL 116 (H) 04/07/2017   CO2 21 (L) 04/07/2017   BUN 22 (H) 04/07/2017   CREATININE 1.00 04/07/2017   GLUCOSE 115 (H) 04/07/2017    Discharge Medications:   Allergies as of 04/07/2017      Reactions   Bee Venom Swelling   Penicillins Other (See Comments)   Has patient had a PCN reaction causing immediate rash, facial/tongue/throat swelling, SOB or lightheadedness with hypotension: Unknown Has patient had a PCN reaction causing severe rash involving mucus membranes or skin necrosis: Unknown Has patient had a PCN reaction that required hospitalization: Unknown Has patient had a PCN reaction occurring within the last 10 years: Unknown If all of the above answers are "NO", then may proceed with Cephalosporin use.      Medication List    STOP taking these medications   doxycycline 100 MG capsule Commonly known as:  VIBRAMYCIN   traMADol 50 MG tablet Commonly known as:  ULTRAM     TAKE these medications   acetaminophen 325 MG tablet Commonly known as:  TYLENOL Take 2 tablets (650 mg total) by mouth every 4 (four) hours as needed for mild pain ((score 1 to 3) or temp > 100.5).   allopurinol 100 MG tablet Commonly known as:  ZYLOPRIM Take 200 mg by mouth at bedtime.   aspirin 81 MG chewable tablet Chew 1 tablet (81 mg total) by mouth 2 (two) times daily with a meal.   atorvastatin 10 MG tablet Commonly known as:  LIPITOR Take 10 mg by mouth at bedtime.   CALCIUM 500 PO Take 500 mg by mouth daily.   clopidogrel 75 MG tablet Commonly known as:  PLAVIX Take 75 mg by mouth daily.   diclofenac sodium 1 % Gel Commonly known as:  VOLTAREN Apply 2 g topically 4 (four) times daily.   famotidine 20 MG tablet Commonly known as:  PEPCID Take 20 mg by mouth daily.   feeding supplement (ENSURE ENLIVE) Liqd Take 237 mLs by mouth 2 (two) times daily between meals.    fesoterodine 4 MG Tb24 tablet Commonly known as:  TOVIAZ Take 1 tablet (4 mg total) by mouth daily.   levofloxacin 250 MG tablet Commonly known as:  LEVAQUIN Take 1 tablet (250 mg total) by mouth daily. Start taking on:  04/08/2017   loratadine 10 MG tablet Commonly known as:  CLARITIN Take 10 mg by mouth daily.   meclizine 25 MG tablet Commonly known as:  ANTIVERT Take 25 mg by  mouth 3 (three) times daily as needed for dizziness.   ondansetron 4 MG tablet Commonly known as:  ZOFRAN Take 1 tablet (4 mg total) by mouth every 6 (six) hours as needed for nausea.   senna-docusate 8.6-50 MG tablet Commonly known as:  Senokot-S Take 2 tablets by mouth 2 (two) times daily.   torsemide 10 MG tablet Commonly known as:  DEMADEX Take 1 tablet (10 mg total) by mouth daily as needed. What changed:    how much to take  when to take this   Vitamin D3 1000 units Caps Take 4,000 Units by mouth daily.       Diagnostic Studies: US Renal  Result Date: 04/04/2017 CLINICAL DATA:  Acute renal failure on chronic kidney disease history of hypertension EXAM: RENAL / URINARY TRACT ULTRASOUND COMPLETE COMPARISON:  None. FINDINGS: Right Kidney: Length: 8.6 cm. Increased echogenicity. No hydronephrosis or focal abnormality Left Kidney: Length: 8.8 cm. Increased echogenicity. No hydronephrosis. Cyst in the upper pole measuring 2.8 x 3.1 x 2.9 cm Bladder: Foley catheter in the bladder IMPRESSION: 1. Echogenic kidneys consistent with medical renal disease. No hydronephrosis 2. Cyst in the left kidney Electronically Signed   By: Jasmine Pang M.D.   On: 04/04/2017 21:48   Dg Pelvis Portable  Result Date: 04/01/2017 CLINICAL DATA:  Status post right hip revision EXAM: PORTABLE PELVIS 1-2 VIEWS COMPARISON:  02/15/2017 FINDINGS: Right femoral prosthesis is noted. The head is well seated within the acetabulum. Surgical drain is noted. No soft tissue changes are noted. IMPRESSION: No acute abnormality noted.  Electronically Signed   By: Alcide Clever M.D.   On: 04/01/2017 20:44   Dg Chest Port 1 View  Result Date: 04/06/2017 CLINICAL DATA:  Cough. EXAM: PORTABLE CHEST 1 VIEW COMPARISON:  02/14/2017 FINDINGS: Enlarged cardiac silhouette. Calcific atherosclerotic disease and tortuosity of the aorta. Mediastinal contours appear intact. Small right and moderate left pleural effusions. Mild prominence of the interstitium. Left lower lobe airspace consolidation cannot be excluded. Osseous structures are without acute abnormality. Soft tissues are grossly normal. IMPRESSION: Bilateral pleural effusions, moderate on the left, new from the prior radiograph dated 02/14/2017. Enlarged heart with mild pulmonary vascular congestion. Left lower lobe airspace consolidation cannot be excluded. Electronically Signed   By: Ted Mcalpine M.D.   On: 04/06/2017 16:17   Dg C-arm 1-60 Min-no Report  Result Date: 04/01/2017 Fluoroscopy was utilized by the requesting physician.  No radiographic interpretation.   Dg Hip Operative Unilat With Pelvis Right  Result Date: 04/01/2017 CLINICAL DATA:  Exchange of femoral ball component of the prosthesis EXAM: OPERATIVE RIGHT HIP WITH PELVIS COMPARISON:  02/15/2017 FLUOROSCOPY TIME:  Radiation Exposure Index (as provided by the fluoroscopic device): Not available If the device does not provide the exposure index: Fluoroscopy Time:  7 seconds Number of Acquired Images:  1 FINDINGS: Right hip prosthesis is noted well seated within the acetabulum. No acute bony abnormality is noted. IMPRESSION: Right hip prosthesis revision Electronically Signed   By: Alcide Clever M.D.   On: 04/01/2017 19:52    Disposition: 03-Skilled Nursing Facility  Discharge Instructions    Call MD / Call 911   Complete by:  As directed    If you experience chest pain or shortness of breath, CALL 911 and be transported to the hospital emergency room.  If you develope a fever above 101 F, pus (white drainage) or  increased drainage or redness at the wound, or calf pain, call your surgeon's office.   Constipation Prevention  Complete by:  As directed    Drink plenty of fluids.  Prune juice may be helpful.  You may use a stool softener, such as Colace (over the counter) 100 mg twice a day.  Use MiraLax (over the counter) for constipation as needed.   Diet - low sodium heart healthy   Complete by:  As directed    Increase activity slowly as tolerated   Complete by:  As directed    TED hose   Complete by:  As directed    Use stockings (TED hose) for 2 weeks on both leg(s).  You may remove them at night for sleeping.       Contact information for follow-up providers    Diana Armijo, Arlys John, MD. Schedule an appointment as soon as possible for a visit in 2 week(s).   Specialty:  Orthopedic Surgery Contact information: [address] [place] [address] [place] [address] [phone]        Cliffton Asters, MD Follow up in 3 week(s).   Specialty:  Infectious Diseases Contact information: [address] [place] [address] [phone]            Contact information for after-discharge care    Destination    [facility] Follow up.   Service:  Skilled Nursing Contact information: [address] [place] [address] [phone]                   Signed: Jonette Pesa 04/07/2017, 3:58 PM

## 2017-04-07 NOTE — Progress Notes (Signed)
Patient ID: [name] [name], female   DOB: 01-20-1918, 84 y.o.   MRN: [id number]          [facility]  Date of Admission:  04/01/2017   Total days of antibiotics 7        Day 5 levofloxacin         ASSESSMENT: She developed wound dehiscence following recent right hip surgery.  Operative cultures were positive from the superficial fatty tissue that grew E. coli.  All deep specimens were culture negative.  I plan on continuing levofloxacin for 3 more weeks then switching to oral trimethoprim sulfamethoxazole for 2 more months.  A chest x-ray yesterday showed small pleural effusions.  I do not believe that she has pneumonia.  PLAN: 1. Continue levofloxacin for 3 more weeks 2. I will arrange follow-up in my clinic within 3 weeks 3. 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 allopurinol  200 mg Oral QHS  . aspirin  81 mg Oral BID  . atorvastatin  10 mg Oral QHS  . clopidogrel  75 mg Oral Daily  . docusate sodium  100 mg Oral BID  . famotidine  20 mg Oral Daily  . feeding supplement (ENSURE ENLIVE)  237 mL Oral BID BM  . fesoterodine  4 mg Oral Daily  . guaiFENesin  600 mg Oral BID  . levofloxacin  250 mg Oral Daily  . loratadine  10 mg Oral Daily  . senna  2 tablet Oral QHS  . torsemide  10 mg Oral BID   Continuous Infusions: . methocarbamol (ROBAXIN)  IV     PRN Meds:.acetaminophen **OR** acetaminophen, alum & mag hydroxide-simeth, diphenhydrAMINE, HYDROcodone-acetaminophen, HYDROcodone-acetaminophen, HYDROmorphone (DILAUDID) injection, meclizine, menthol-cetylpyridinium **OR** phenol, methocarbamol **OR** methocarbamol (ROBAXIN)  IV, metoCLOPramide **OR** metoCLOPramide (REGLAN) injection, ondansetron **OR** ondansetron (ZOFRAN) IV,  polyethylene glycol   SUBJECTIVE: She is feeling better.  She is not having any pain.  She says that she only has occasional cough but does not bring up any sputum and is not short of breath.  Review of Systems: Review of Systems  Constitutional: Negative for chills, diaphoresis and fever.  Respiratory: Positive for cough. Negative for sputum production and shortness of breath.   Gastrointestinal: Negative for abdominal pain, diarrhea, nausea and vomiting.  Musculoskeletal: Negative for joint pain.    Allergies  Allergen Reactions  . Bee Venom Swelling  . Penicillins Other (See Comments)    Has patient had a PCN reaction causing immediate rash, facial/tongue/throat swelling, SOB or lightheadedness with hypotension: Unknown Has patient had a PCN reaction causing severe rash involving mucus membranes or skin necrosis: Unknown Has patient had a PCN reaction that required hospitalization: Unknown Has patient had a PCN reaction occurring within the last 10 years: Unknown If all of the above answers are "NO", then may proceed with Cephalosporin use.     OBJECTIVE: Vitals:   04/06/17 2135 04/07/17 0506 04/07/17 1313 04/07/17 1317  BP: 135/60 (!) 150/93 (!) 147/59 (!) 148/63  Pulse: 86 88 79 94  Resp: 15 16 16 16   Temp: 98.4 F (36.9 C) 98 F (36.7 C) 97.7 F (36.5 C) (!) 97.5 F (36.4 C)  TempSrc: Oral Oral Oral Oral  SpO2: 98% 97% 98% 95%  Weight:  148 lb 9.4 oz (67.4 kg)    Height:       Body mass index is 31.06 kg/m.  Physical Exam  Constitutional: She is oriented to person, place, and time.  She is resting quietly in bed.  She is in good spirits.  Cardiovascular: Normal rate and regular rhythm.  No murmur heard. Pulmonary/Chest: Effort normal. She has no wheezes. She has no rales.  Musculoskeletal:  VAC dressing in place over right hip.  Neurological: She is alert and oriented to person, place, and time.  Skin: No rash noted.  Psychiatric: Mood and affect normal.      Lab Results Lab Results  Component Value Date   WBC 8.4 04/06/2017   HGB 9.5 (L) 04/06/2017   HCT 29.2 (L) 04/06/2017   MCV 86.1 04/06/2017   PLT 209 04/06/2017    Lab Results  Component Value Date   CREATININE 1.00 04/07/2017   BUN 22 (H) 04/07/2017   NA 145 04/07/2017   K 3.6 04/07/2017   CL 116 (H) 04/07/2017   CO2 21 (L) 04/07/2017    Lab Results  Component Value Date   ALT 74 (H) 02/19/2017   AST 77 (H) 02/19/2017   ALKPHOS 83 02/19/2017   BILITOT 1.3 (H) 02/19/2017     Microbiology: Recent Results (from the past 240 hour(s))  Anaerobic culture     Status: None   Collection Time: 04/01/17  5:51 PM  Result Value Ref Range Status   Specimen Description   Final    TISSUE FATTY Performed at [facility], [address] [place], [place] [address]    Special Requests   Final    NONE Performed at [facility], [address] [place], [place] [address]    Culture   Final    NO GROWTH 5 DAYS Performed at [facility], [address] [place], [place] [address]    Report Status 04/06/2017 FINAL  Final  Aerobic Culture (superficial specimen)     Status: None   Collection Time: 04/01/17  5:51 PM  Result Value Ref Range Status   Specimen Description   Final    TISSUE FATTY Performed at [facility], [address] [place], [place] [address]    Special Requests   Final    NONE Performed at [facility], [address] [place], [place] [address]    Gram Stain NO WBC SEEN NO ORGANISMS SEEN   Final   Culture   Final    RARE ESCHERICHIA COLI CRITICAL RESULT CALLED TO, READ BACK BY AND VERIFIED WITH: E HABIB,RN AT 1325 04/03/17 BY L BENFIELD CONCERNING GROWTH ON CULTURE Performed at [facility], [address] [place], [place] [address]    Report Status 04/04/2017 FINAL  Final   Organism ID, Bacteria ESCHERICHIA COLI  Final      Susceptibility   Escherichia coli -  MIC*    AMPICILLIN <=2 SENSITIVE Sensitive     CEFAZOLIN <=4 SENSITIVE Sensitive     CEFEPIME <=1 SENSITIVE Sensitive     CEFTAZIDIME <=1 SENSITIVE Sensitive     CEFTRIAXONE <=1 SENSITIVE Sensitive     CIPROFLOXACIN <=0.25 SENSITIVE Sensitive     GENTAMICIN <=1 SENSITIVE Sensitive     IMIPENEM <=0.25 SENSITIVE Sensitive     TRIMETH/SULFA <=20 SENSITIVE Sensitive     AMPICILLIN/SULBACTAM <=2 SENSITIVE Sensitive     PIP/TAZO <=4 SENSITIVE Sensitive     Extended ESBL NEGATIVE Sensitive     *  RARE ESCHERICHIA COLI  Aerobic/Anaerobic Culture (surgical/deep wound)     Status: None   Collection Time: 04/01/17  6:10 PM  Result Value Ref Range Status   Specimen Description   Final    SYNOVIAL JOINT Performed at [facility], [address], [place], [place] [address]    Special Requests   Final    NONE Performed at [facility], [address], [place], [place] [address]    Gram Stain   Final    ABUNDANT WBC PRESENT,BOTH PMN AND MONONUCLEAR NO ORGANISMS SEEN    Culture   Final    No growth aerobically or anaerobically. Performed at [facility], [address], [place], [place] [address]    Report Status 04/07/2017 FINAL  Final  Aerobic/Anaerobic Culture (surgical/deep wound)     Status: None   Collection Time: 04/01/17  6:32 PM  Result Value Ref Range Status   Specimen Description   Final    HIP RIGHT PSEUDO CAPSULE Performed at [facility], [address] [place], [place] [address]    Special Requests   Final    NONE Performed at [facility], [address] [place], [place] [address]    Gram Stain   Final    RARE WBC PRESENT, PREDOMINANTLY MONONUCLEAR NO ORGANISMS SEEN    Culture   Final    No growth aerobically or anaerobically. Performed at [facility], [address], [place], [place] [address]    Report Status 04/07/2017 FINAL  Final  Aerobic/Anaerobic Culture (surgical/deep  wound)     Status: None   Collection Time: 04/01/17  6:33 PM  Result Value Ref Range Status   Specimen Description   Final    HIP RIGHT PSEUDO CAPSULE 2 Performed at [facility], [address], [place], [place] [address]    Special Requests   Final    NONE Performed at [facility], [address] [place], [place] [address]    Gram Stain   Final    RARE WBC PRESENT, PREDOMINANTLY MONONUCLEAR NO ORGANISMS SEEN    Culture   Final    No growth aerobically or anaerobically. Performed at [facility], [address], [place], [place] [address]    Report Status 04/07/2017 FINAL  Final    Cliffton Asters, MD [facility] for Infectious Disease [facility] [phone] pager   [phone] cell 04/07/2017, 3:15 PM

## 2017-04-07 NOTE — [place]2 (Signed)
[place] MEDICAID FL2 LEVEL OF CARE SCREENING TOOL     IDENTIFICATION  Patient Name: Nonda LouUdell S Minter Birthdate: [birth year] Sex: female Admission Date (Current Location): 04/01/2017  [facility] [place]   Facility and Address:  [facility],  [address], [place] [id number]      Provider Number: [id number]  Attending Physician Name and Address:  Samson FredericSwinteck, Athalee Esterline, MD  Relative Name and Phone Number:       Current Level of Care: Hospital Recommended Level of Care: Skilled Nursing Facility Prior Approval Number:    Date Approved/Denied:   PASRR Number: [id number]  Discharge Plan: SNF    Current Diagnoses: Patient Active Problem List   Diagnosis Date Noted  . History of hemiarthroplasty of right hip 04/02/2017  . 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 Postoperative wound dehiscence 04/01/2017  . Bilateral hearing loss   . Diabetes mellitus type 2 in nonobese (HCC)   . Stage 3 chronic kidney disease (HCC)   . Benign essential HTN   . H/O: CVA (cerebrovascular accident)   . Atelectasis of left lung 02/15/2017  . Displaced fracture of right femoral neck (HCC) 02/15/2017  . Diabetes mellitus, type II (HCC) 03/09/2011  . Anemia 03/09/2011  . Coagulopathy (HCC) 03/09/2011  . HTN (hypertension) 02/19/2011  . Dizziness 02/19/2011  . Cerebellar infarct (HCC) 02/19/2011  . H/O: CVA (cardiovascular accident) 02/19/2011  . SPONDYLOSIS 09/18/2009  . SCIATICA 09/18/2009  . SCOLIOSIS-IDIOPATHIC 09/18/2009  . SPONDYLOLITHESIS 09/18/2009    Orientation RESPIRATION BLADDER Height & Weight     Self  Normal Incontinent Weight: 148 lb 9.4 oz (67.4 kg) Height:  4\' 10"  (147.3 cm)  BEHAVIORAL SYMPTOMS/MOOD NEUROLOGICAL BOWEL NUTRITION STATUS      Continent Diet(See discharge summary )  AMBULATORY STATUS COMMUNICATION OF NEEDS Skin   Extensive Assist Verbally Wound Vac                       Personal Care Assistance  Level of Assistance  Feeding, Dressing, Bathing Bathing Assistance: Maximum assistance Feeding assistance: Independent Dressing Assistance: Maximum assistance     Functional Limitations Info  Sight, Hearing, Speech Sight Info: Impaired Hearing Info: Adequate Speech Info: Adequate    SPECIAL CARE FACTORS FREQUENCY  PT (By licensed PT), OT (By licensed OT)     PT Frequency: 5x/week OT Frequency: 5x/week            Contractures Contractures Info: Not present    Additional Factors Info  Code Status, Allergies Code Status Info: Fullcode Allergies Info: Allergies: Bee Venom, Penicillins           Current Medications (04/07/2017):  This is the current hospital active medication list Current Facility-Administered Medications  Medication Dose Route Frequency Provider Last Rate Last Dose  . acetaminophen (TYLENOL) tablet 650 mg  650 mg Oral Q4H PRN Samson FredericSwinteck, Damiel Barthold, MD   650 mg at 04/04/17 2155   Or  . acetaminophen (TYLENOL) suppository 650 mg  650 mg Rectal Q4H PRN Eirik Schueler, Arlys JohnBrian, MD      . allopurinol (ZYLOPRIM) tablet 200 mg  200 mg Oral QHS Samson FredericSwinteck, Beryle Zeitz, MD   200 mg at 04/06/17 2233  . alum & mag hydroxide-simeth (MAALOX/MYLANTA) 200-200-20 MG/5ML suspension 30 mL  30 mL Oral Q4H PRN Tyreona Panjwani, Arlys JohnBrian, MD      . aspirin chewable tablet 81 mg  81 mg Oral BID Samson FredericSwinteck, Jesten Cappuccio, MD   81 mg at 04/06/17 2233  . atorvastatin (  LIPITOR) tablet 10 mg  10 mg Oral QHS Samson Frederic, MD   10 mg at 04/06/17 2233  . clopidogrel (PLAVIX) tablet 75 mg  75 mg Oral Daily Samson Frederic, MD   75 mg at 04/06/17 1033  . diphenhydrAMINE (BENADRYL) 12.5 MG/5ML elixir 12.5-25 mg  12.5-25 mg Oral Q4H PRN Hughey Rittenberry, Arlys John, MD      . docusate sodium (COLACE) capsule 100 mg  100 mg Oral BID Samson Frederic, MD   100 mg at 04/06/17 2233  . famotidine (PEPCID) tablet 20 mg  20 mg Oral Daily Samson Frederic, MD   20 mg at 04/06/17 1033  . feeding supplement (ENSURE ENLIVE) (ENSURE ENLIVE) liquid 237 mL   237 mL Oral BID BM Samson Frederic, MD   237 mL at 04/06/17 1045  . fesoterodine (TOVIAZ) tablet 4 mg  4 mg Oral Daily Samson Frederic, MD   4 mg at 04/06/17 1033  . guaiFENesin (MUCINEX) 12 hr tablet 600 mg  600 mg Oral BID Rodolph Bong, MD      . HYDROcodone-acetaminophen (NORCO/VICODIN) 5-325 MG per tablet 1 tablet  1 tablet Oral Q4H PRN Samson Frederic, MD   1 tablet at 04/05/17 1103  . HYDROcodone-acetaminophen (NORCO/VICODIN) 5-325 MG per tablet 2 tablet  2 tablet Oral Q4H PRN Nailyn Dearinger, Arlys John, MD      . HYDROmorphone (DILAUDID) injection 0.5-1 mg  0.5-1 mg Intravenous Q2H PRN Tavares Levinson, Arlys John, MD      . levofloxacin (LEVAQUIN) tablet 250 mg  250 mg Oral Daily Samson Frederic, MD   250 mg at 04/06/17 1033  . loratadine (CLARITIN) tablet 10 mg  10 mg Oral Daily Samson Frederic, MD   10 mg at 04/06/17 1033  . meclizine (ANTIVERT) tablet 25 mg  25 mg Oral TID PRN Samson Frederic, MD      . menthol-cetylpyridinium (CEPACOL) lozenge 3 mg  1 lozenge Oral PRN Tate Jerkins, Arlys John, MD       Or  . phenol (CHLORASEPTIC) mouth spray 1 spray  1 spray Mouth/Throat PRN Abrea Henle, Arlys John, MD      . methocarbamol (ROBAXIN) tablet 500 mg  500 mg Oral Q6H PRN Samson Frederic, MD   500 mg at 04/04/17 1610   Or  . methocarbamol (ROBAXIN) 500 mg in dextrose 5 % 50 mL IVPB  500 mg Intravenous Q6H PRN Carole Doner, Arlys John, MD      . metoCLOPramide (REGLAN) tablet 5-10 mg  5-10 mg Oral Q8H PRN Jereld Presti, Arlys John, MD       Or  . metoCLOPramide (REGLAN) injection 5-10 mg  5-10 mg Intravenous Q8H PRN Gaylen Pereira, Arlys John, MD      . ondansetron (ZOFRAN) tablet 4 mg  4 mg Oral Q6H PRN Taishaun Levels, Arlys John, MD       Or  . ondansetron (ZOFRAN) injection 4 mg  4 mg Intravenous Q6H PRN Jakari Sada, Arlys John, MD      . polyethylene glycol (MIRALAX / GLYCOLAX) packet 17 g  17 g Oral Daily PRN Samson Frederic, MD   17 g at 04/03/17 9604  . senna (SENOKOT) tablet 17.2 mg  2 tablet Oral QHS Samson Frederic, MD   17.2 mg at 04/06/17 2233  . torsemide  (DEMADEX) tablet 10 mg  10 mg Oral BID Dorothea Ogle, MD         Discharge Medications: Please see discharge summary for a list of discharge medications.  Relevant Imaging Results:  Relevant Lab Results:   Additional Information SSN: [id number]  Clearance Coots, LCSW

## 2017-04-07 NOTE — Discharge Summary (Signed)
Physician Discharge Summary  Patient ID: [name] [name] MRN: [id number] DOB/AGE: 03/13/1917 84 y.o.  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 Anemia    blood transfusion during hospitalization of 02/15/2017   . Arthritis   . Chronic kidney disease    CKD- stage III  . Constipation   . Diabetes mellitus type II 02/19/2011   diet controlled   . Dizziness   . E. coli urinary tract infection   . Gout   . Gout spondylitis   . 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 History of dizziness   . History of gout   . Hyperlipidemia   . Hypertension   . Overactive bladder   . Sciatica   . Stroke (HCC)     Surgeries: Procedure(s): DEBRIDEMENT OF RIGHT HIP,  HEAD BALL EXCHANGE on 04/01/2017   Consultants (if any): Treatment Team:  Myers, Iskra M, MD  Discharged Condition: Improved  Hospital Course: [name] [name] is an 84 y.o. 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    She was given perioperative antibiotics:  Anti-infectives (From admission, onward)   Start     Dose/Rate Route Frequency Ordered Stop   04/08/17 0000  levofloxacin (LEVAQUIN) 250 MG tablet     250 mg Oral Daily 04/07/17 1304     04/06/17 1000  levofloxacin (LEVAQUIN) tablet 250 mg     250 mg Oral Daily 04/06/17 0944     04/05/17 1000  levofloxacin (LEVAQUIN) tablet 250 mg  Status:  Discontinued     250 mg Oral Every 48 hours 04/03/17 1200 04/06/17 0944   04/03/17 1845   vancomycin (VANCOCIN) IVPB 1000 mg/200 mL premix  Status:  Discontinued     1,000 mg 200 mL/hr over 60 Minutes Intravenous  Once 04/03/17 1830 04/03/17 1905   04/02/17 1730  levofloxacin (LEVAQUIN) tablet 250 mg  Status:  Discontinued     250 mg Oral Daily 04/02/17 1652 04/03/17 1200   04/02/17 1530  rifampin (RIFADIN) capsule 300 mg  Status:  Discontinued     300 mg Oral Every 12 hours 04/02/17 1522 04/03/17 1912   04/02/17 0000  clindamycin (CLEOCIN) IVPB 600 mg  Status:  Discontinued     600 mg 100 mL/hr over 30 Minutes Intravenous Every 6 hours 04/01/17 2200 04/02/17 1649   04/01/17 1835  polymyxin B 500,000 Units, bacitracin 50,000 Units in sodium chloride 0.9 % 500 mL irrigation  Status:  Discontinued       As needed 04/01/17 1835 04/01/17 2027   04/01/17 1445  vancomycin (VANCOCIN) IVPB 1000 mg/200 mL premix     1,000 mg 200 mL/hr over 60 Minutes Intravenous On call to O.R. 04/01/17 1439 04/01/17 1719   04/01/17 1445  clindamycin (CLEOCIN) IVPB 900 mg     900 mg 100 mL/hr over 30 Minutes Intravenous On call to O.R. 04/01/17 1440 04/01/17 1745    .  She was given sequential compression devices, early ambulation, and ASA + plavix for DVT   prophylaxis.  She benefited maximally from the hospital stay and there were no complications.    Deep cultures were negative; superficial cultures grew E. Coli. ID was consulted, and recommended PO levaquin.  She required PRBCs for ABLA.  The hospitalist was consulted for AoCKD which improved with hydration.  Recent vital signs:  Vitals:   04/07/17 1313 04/07/17 1317  BP: (!) 147/59 (!) 148/63  Pulse: 79 94  Resp: 16 16  Temp: 97.7 F (36.5 C) (!) 97.5 F (36.4 C)  SpO2: 98% 95%    Recent laboratory studies:  Lab Results  Component Value Date   HGB 9.5 (L) 04/06/2017   HGB 9.4 (L) 04/05/2017   HGB 8.7 (L) 04/04/2017   Lab Results  Component Value Date   WBC 8.4 04/06/2017   PLT 209 04/06/2017   Lab Results  Component Value  Date   INR 1.36 03/09/2011   Lab Results  Component Value Date   NA 145 04/07/2017   K 3.6 04/07/2017   CL 116 (H) 04/07/2017   CO2 21 (L) 04/07/2017   BUN 22 (H) 04/07/2017   CREATININE 1.00 04/07/2017   GLUCOSE 115 (H) 04/07/2017    Discharge Medications:   Allergies as of 04/07/2017      Reactions   Bee Venom Swelling   Penicillins Other (See Comments)   Has patient had a PCN reaction causing immediate rash, facial/tongue/throat swelling, SOB or lightheadedness with hypotension: Unknown Has patient had a PCN reaction causing severe rash involving mucus membranes or skin necrosis: Unknown Has patient had a PCN reaction that required hospitalization: Unknown Has patient had a PCN reaction occurring within the last 10 years: Unknown If all of the above answers are "NO", then may proceed with Cephalosporin use.      Medication List    STOP taking these medications   doxycycline 100 MG capsule Commonly known as:  VIBRAMYCIN   traMADol 50 MG tablet Commonly known as:  ULTRAM     TAKE these medications   acetaminophen 325 MG tablet Commonly known as:  TYLENOL Take 2 tablets (650 mg total) by mouth every 4 (four) hours as needed for mild pain ((score 1 to 3) or temp > 100.5).   allopurinol 100 MG tablet Commonly known as:  ZYLOPRIM Take 200 mg by mouth at bedtime.   aspirin 81 MG chewable tablet Chew 1 tablet (81 mg total) by mouth 2 (two) times daily with a meal.   atorvastatin 10 MG tablet Commonly known as:  LIPITOR Take 10 mg by mouth at bedtime.   CALCIUM 500 PO Take 500 mg by mouth daily.   clopidogrel 75 MG tablet Commonly known as:  PLAVIX Take 75 mg by mouth daily.   diclofenac sodium 1 % Gel Commonly known as:  VOLTAREN Apply 2 g topically 4 (four) times daily.   famotidine 20 MG tablet Commonly known as:  PEPCID Take 20 mg by mouth daily.   feeding supplement (ENSURE ENLIVE) Liqd Take 237 mLs by mouth 2 (two) times daily between meals.    fesoterodine 4 MG Tb24 tablet Commonly known as:  TOVIAZ Take 1 tablet (4 mg total) by mouth daily.   levofloxacin 250 MG tablet Commonly known as:  LEVAQUIN Take 1 tablet (250 mg total) by mouth daily. Start taking on:  04/08/2017   loratadine 10 MG tablet Commonly known as:  CLARITIN Take 10 mg by mouth daily.   meclizine 25 MG tablet Commonly known as:  ANTIVERT Take 25 mg by   mouth 3 (three) times daily as needed for dizziness.   ondansetron 4 MG tablet Commonly known as:  ZOFRAN Take 1 tablet (4 mg total) by mouth every 6 (six) hours as needed for nausea.   senna-docusate 8.6-50 MG tablet Commonly known as:  Senokot-S Take 2 tablets by mouth 2 (two) times daily.   torsemide 10 MG tablet Commonly known as:  DEMADEX Take 1 tablet (10 mg total) by mouth daily as needed. What changed:    how much to take  when to take this   Vitamin D3 1000 units Caps Take 4,000 Units by mouth daily.       Diagnostic Studies: Us Renal  Result Date: 04/04/2017 CLINICAL DATA:  Acute renal failure on chronic kidney disease history of hypertension EXAM: RENAL / URINARY TRACT ULTRASOUND COMPLETE COMPARISON:  None. FINDINGS: Right Kidney: Length: 8.6 cm. Increased echogenicity. No hydronephrosis or focal abnormality Left Kidney: Length: 8.8 cm. Increased echogenicity. No hydronephrosis. Cyst in the upper pole measuring 2.8 x 3.1 x 2.9 cm Bladder: Foley catheter in the bladder IMPRESSION: 1. Echogenic kidneys consistent with medical renal disease. No hydronephrosis 2. Cyst in the left kidney Electronically Signed   By: Kim  Fujinaga M.D.   On: 04/04/2017 21:48   Dg Pelvis Portable  Result Date: 04/01/2017 CLINICAL DATA:  Status post right hip revision EXAM: PORTABLE PELVIS 1-2 VIEWS COMPARISON:  02/15/2017 FINDINGS: Right femoral prosthesis is noted. The head is well seated within the acetabulum. Surgical drain is noted. No soft tissue changes are noted. IMPRESSION: No acute abnormality noted.  Electronically Signed   By: Mark  Lukens M.D.   On: 04/01/2017 20:44   Dg Chest Port 1 View  Result Date: 04/06/2017 CLINICAL DATA:  Cough. EXAM: PORTABLE CHEST 1 VIEW COMPARISON:  02/14/2017 FINDINGS: Enlarged cardiac silhouette. Calcific atherosclerotic disease and tortuosity of the aorta. Mediastinal contours appear intact. Small right and moderate left pleural effusions. Mild prominence of the interstitium. Left lower lobe airspace consolidation cannot be excluded. Osseous structures are without acute abnormality. Soft tissues are grossly normal. IMPRESSION: Bilateral pleural effusions, moderate on the left, new from the prior radiograph dated 02/14/2017. Enlarged heart with mild pulmonary vascular congestion. Left lower lobe airspace consolidation cannot be excluded. Electronically Signed   By: Dobrinka  Dimitrova M.D.   On: 04/06/2017 16:17   Dg C-arm 1-60 Min-no Report  Result Date: 04/01/2017 Fluoroscopy was utilized by the requesting physician.  No radiographic interpretation.   Dg Hip Operative Unilat With Pelvis Right  Result Date: 04/01/2017 CLINICAL DATA:  Exchange of femoral ball component of the prosthesis EXAM: OPERATIVE RIGHT HIP WITH PELVIS COMPARISON:  02/15/2017 FLUOROSCOPY TIME:  Radiation Exposure Index (as provided by the fluoroscopic device): Not available If the device does not provide the exposure index: Fluoroscopy Time:  7 seconds Number of Acquired Images:  1 FINDINGS: Right hip prosthesis is noted well seated within the acetabulum. No acute bony abnormality is noted. IMPRESSION: Right hip prosthesis revision Electronically Signed   By: Mark  Lukens M.D.   On: 04/01/2017 19:52    Disposition: 03-Skilled Nursing Facility  Discharge Instructions    Call MD / Call 911   Complete by:  As directed    If you experience chest pain or shortness of breath, CALL 911 and be transported to the hospital emergency room.  If you develope a fever above 101 F, pus (white drainage) or  increased drainage or redness at the wound, or calf pain, call your surgeon's office.   Constipation Prevention     Complete by:  As directed    Drink plenty of fluids.  Prune juice may be helpful.  You may use a stool softener, such as Colace (over the counter) 100 mg twice a day.  Use MiraLax (over the counter) for constipation as needed.   Diet - low sodium heart healthy   Complete by:  As directed    Increase activity slowly as tolerated   Complete by:  As directed    TED hose   Complete by:  As directed    Use stockings (TED hose) for 2 weeks on both leg(s).  You may remove them at night for sleeping.       Contact information for follow-up providers    Vinia Jemmott, MD. Schedule an appointment as soon as possible for a visit in 2 week(s).   Specialty:  Orthopedic Surgery Contact information: [address] [place] [address] [phone]        Campbell, John, MD Follow up in 3 week(s).   Specialty:  Infectious Diseases Contact information: [address]  [place] [address] [phone]            Contact information for after-discharge care    Destination    [facility] [place] [facility] Follow up.   Service:  Skilled Nursing Contact information: [address] [place] [address] [phone]                   Signed: Dakarri Kessinger J Kamie Korber 04/07/2017, 3:58 PM  

## 2017-04-07 NOTE — Progress Notes (Signed)
Consult/PROGRESS NOTE    [name] [name]  ZOX:[id number] DOB: 1917-09-28 DOA: 04/01/2017 PCP: Reynolds Bowl, MD    Brief Narrative:  Pt is 84 yo female with who underwent right hip arthroplasty 02/15/2017 for displaced femoral neck fracture and now presented for evaluation and management of wound dehiscence. She underwent I&D of right hip wound and today is post op day #3. Pt has done well post op but blood work notable for progressively worsening renal function and TRH asked further evaluate. Pt currently reports pain is controlled, she denies any abd or urinary concerns, she is eating breakfast and says she is tolerating it well. No fevers, chills, no chest pain or dyspnea.     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 coli.  Deep surgical cultures currently negative.  Patient status post debridement of right hip with ball head exchange /application of incisional wound VAC 04/01/2017 per orthopedics.  Per primary team.  Patient currently on Levaquin with ID recommendations to continue for 28 days and then converting to a longer course of oral Bactrim for presumed deep infection around hardware.   -Outpatient follow-up with ID will be arranged.  Active Problems: Anemia -Slight decrease in hemoglobin likely dilutional component versus postop.  Hemoglobin has remained stable.  Follow H&H.   Acute kidney injury -Likely secondary to prerenal etiology and concern for ATN.  Fractional excretion of sodium was 6%.  Concern for possible component of ATN.  Renal ultrasound consistent with chronic medical renal disease with no hydronephrosis.  Renal function improved daily.  IV  fluids have been discontinued.  No further workup at this point.     Cough -Likely secondary to IV fluids.  IV fluids discontinued.  Patient with clinical improvement.  Patient afebrile.  Chest x-ray done was concerning for bilateral pleural effusions, enlarged heart with mild pulmonary vascular congestion, left lower lobe airspace consolidation could not be excluded.  Patient is afebrile.  Patient does not have a leukocytosis.  Patient does not have a productive cough.  Patient does not look septic.  Patient speaking in full sentences.  Doubt highly if patient has a pneumonia.  Patient already on Levaquin and will be discharged on 28 days of Levaquin.  Patient has been started back on home regimen of torsemide.  Follow.       DVT prophylaxis: Aspirin and Plavix/per primary team Code Status: Full Family Communication: Updated patient.  No family at bedside. Disposition Plan: Okay from a medicine perspective to discharge patient to skilled nursing facility today.   Consultants:   Infectious disease: Dr. Orvan Falconer 04/02/2017  [facility] hospital[facility] Dr. Izola Price 04/04/2017  Procedures:   Plain films of the pelvis 04/01/2017  Chest x-ray 04/06/2017  Renal ultrasound 04/04/2017  Debridement of right hip with head ball exchange/application of incisional wound VAC per Dr. Linna Caprice 04/01/2017  Antimicrobials:   Levaquin 04/02/2017>>>> x28 more days  Rifampin 04/02/2017>>>> 04/03/2017  Clindamycin 04/01/2017>>>>> 04/02/2017     Subjective: Patient denies any cough.  No chest pain.  No shortness of breath.  Patient speaking in full sentences.  Patient states she feels well today.  Objective: Vitals:   04/06/17 2135 04/07/17 0506 04/07/17 1313 04/07/17 1317  BP: 135/60 (!) 150/93 (!) 147/59 (!) 148/63  Pulse: 86 88 79 94  Resp: 15 16 16 16   Temp: 98.4 F (36.9 C) 98 F (36.7 C) 97.7 F (36.5 C) (!) 97.5 F (36.4 C)  TempSrc: Oral Oral Oral Oral  SpO2: 98% 97% 98% 95%  Weight:  67.4 kg  (148 lb 9.4 oz)    Height:        Intake/Output Summary (Last 24 hours) at 04/07/2017 1451 Last data filed at 04/07/2017 1317 Gross per 24 hour  Intake 570 ml  Output 400 ml  Net 170 ml   Filed Weights   04/01/17 1500 04/07/17 0506  Weight: 56.7 kg (125 lb) 67.4 kg (148 lb 9.4 oz)    Examination:  General exam: Appears calm and comfortable Respiratory system: Bibasilar crackles.  No wheezes, no rhonchi.  Respiratory effort normal. Cardiovascular system: S1 & S2 heard, RRR. No JVD, murmurs, rubs, gallops or clicks. No pedal edema. Gastrointestinal system: Abdomen is nondistended, soft and nontender. No organomegaly or masses felt. Normal bowel sounds heard.   Central nervous system: Alert and oriented. No focal neurological deficits. Extremities: Symmetric 5 x 5 power.  Drain noted right hip. Skin: No rashes, lesions or ulcers Psychiatry: Judgement and insight appear normal. Mood & affect appropriate.     Data Reviewed: I have personally reviewed following labs and imaging studies  CBC: Recent Labs  Lab 04/01/17 1455  04/02/17 0534 04/03/17 0521 04/04/17 0510 04/05/17 0555 04/06/17 0530  WBC 8.1  --  8.7 6.9 9.9 10.1 8.4  NEUTROABS 4.6  --   --   --   --   --   --   HGB 9.6*   < > 7.8* 9.6* 8.7* 9.4* 9.5*  HCT 30.1*   < > 24.0* 29.3* 25.9* 29.3* 29.2*  MCV 87.0  --  87.9 84.4 84.4 86.2 86.1  PLT 272  --  216 181 179 198 209   < > = values in this interval not displayed.   Basic Metabolic Panel: Recent Labs  Lab 04/03/17 0521 04/04/17 0510 04/05/17 0555 04/06/17 0530 04/07/17 0551  NA 142 140 144 143 145  K 4.7 4.1 3.9 3.6 3.6  CL 107 111 114* 114* 116*  CO2 23 21* 20* 20* 21*  GLUCOSE 168* 90 100* 118* 115*  BUN 38* 42* 33* 25* 22*  CREATININE 1.77* 1.94* 1.45* 1.11* 1.00  CALCIUM 8.1* 7.3* 7.9* 7.8* 8.1*   GFR: Estimated Creatinine Clearance: 26.1 mL/min (by C-G formula based on SCr of 1 mg/dL). Liver Function Tests: No results for input(s): AST, ALT,  ALKPHOS, BILITOT, PROT, ALBUMIN in the last 168 hours. No results for input(s): LIPASE, AMYLASE in the last 168 hours. No results for input(s): AMMONIA in the last 168 hours. Coagulation Profile: No results for input(s): INR, PROTIME in the last 168 hours. Cardiac Enzymes: No results for input(s): CKTOTAL, CKMB, CKMBINDEX, TROPONINI in the last 168 hours. BNP (last 3 results) No results for input(s): PROBNP in the last 8760 hours. HbA1C: No results for input(s): HGBA1C in the last 72 hours. CBG: Recent Labs  Lab 04/01/17 1451 04/01/17 2034  GLUCAP 97 155*   Lipid Profile: No results for input(s): CHOL, HDL, LDLCALC, TRIG, CHOLHDL, LDLDIRECT in the last 72 hours. Thyroid Function Tests: No results for input(s): TSH, T4TOTAL, FREET4, T3FREE, THYROIDAB in the last 72 hours. Anemia Panel: No results for input(s): VITAMINB12, FOLATE, FERRITIN, TIBC, IRON, RETICCTPCT in the last 72 hours. Sepsis Labs: No results for  input(s): PROCALCITON, LATICACIDVEN in the last 168 hours.  Recent Results (from the past 240 hour(s))  Anaerobic culture     Status: None   Collection Time: 04/01/17  5:51 PM  Result Value Ref Range Status   Specimen Description   Final    TISSUE FATTY Performed at [facility], [address], [place], [place] [address]    Special Requests   Final    NONE Performed at [facility], [address], [place], [place] [address]    Culture   Final    NO GROWTH 5 DAYS Performed at [facility], [address] [place], [place] [address]    Report Status 04/06/2017 FINAL  Final  Aerobic Culture (superficial specimen)     Status: None   Collection Time: 04/01/17  5:51 PM  Result Value Ref Range Status   Specimen Description   Final    TISSUE FATTY Performed at [facility], [address], [place], [place] [address]    Special Requests   Final    NONE Performed at [facility], [address], [place], [place] [address]    Gram Stain NO WBC SEEN NO ORGANISMS SEEN   Final   Culture   Final    RARE ESCHERICHIA COLI CRITICAL RESULT CALLED TO, READ BACK BY AND VERIFIED WITH: E HABIB,RN AT 1325 04/03/17 BY L BENFIELD CONCERNING GROWTH ON CULTURE Performed at [facility], [address], [place], [place] [address]    Report Status 04/04/2017 FINAL  Final   Organism ID, Bacteria ESCHERICHIA COLI  Final      Susceptibility   Escherichia coli - MIC*    AMPICILLIN <=2 SENSITIVE Sensitive     CEFAZOLIN <=4 SENSITIVE Sensitive     CEFEPIME <=1 SENSITIVE Sensitive     CEFTAZIDIME <=1 SENSITIVE Sensitive     CEFTRIAXONE <=1 SENSITIVE Sensitive     CIPROFLOXACIN <=0.25 SENSITIVE Sensitive     GENTAMICIN <=1 SENSITIVE Sensitive     IMIPENEM <=0.25 SENSITIVE Sensitive     TRIMETH/SULFA <=20 SENSITIVE Sensitive     AMPICILLIN/SULBACTAM <=2 SENSITIVE Sensitive     PIP/TAZO <=4 SENSITIVE Sensitive     Extended ESBL NEGATIVE Sensitive     * RARE ESCHERICHIA COLI  Aerobic/Anaerobic Culture (surgical/deep wound)     Status: None   Collection Time: 04/01/17  6:10 PM  Result Value Ref Range Status   Specimen Description   Final    SYNOVIAL JOINT Performed at [facility], [address], [place], [place] [address]    Special Requests   Final    NONE Performed at [facility], [address], [place], [place] [address]    Gram Stain   Final    ABUNDANT WBC PRESENT,BOTH PMN AND MONONUCLEAR NO ORGANISMS SEEN    Culture   Final    No growth aerobically or anaerobically. Performed at [facility], [address], [place], [place] [address]    Report Status 04/07/2017 FINAL  Final  Aerobic/Anaerobic Culture (surgical/deep wound)     Status: None   Collection Time: 04/01/17  6:32 PM  Result Value Ref Range Status   Specimen Description   Final    HIP RIGHT PSEUDO CAPSULE Performed at [facility], [address], [place], [place] [address]    Special Requests   Final    NONE Performed at [facility], [address] [place], [place] [address]    Gram Stain   Final    RARE WBC PRESENT, PREDOMINANTLY MONONUCLEAR NO ORGANISMS SEEN    Culture   Final    No growth aerobically or anaerobically. Performed at [facility], [address] [place], [place] [address]    Report Status 04/07/2017 FINAL  Final  Aerobic/Anaerobic Culture (surgical/deep wound)     Status: None   Collection Time: 04/01/17  6:33 PM  Result Value Ref Range Status   Specimen Description   Final    HIP RIGHT PSEUDO CAPSULE 2 Performed at [facility], [address] [place], [place] [address]    Special Requests   Final    NONE Performed at [facility], [address] [place], [place] [address]    Gram Stain   Final    RARE WBC PRESENT, PREDOMINANTLY MONONUCLEAR NO ORGANISMS SEEN    Culture   Final    No growth aerobically or anaerobically. Performed at [facility], [address] [place], [place] [address]    Report Status 04/07/2017 FINAL  Final         Radiology Studies: Dg Chest Port 1 View  Result Date: 04/06/2017 CLINICAL DATA:  Cough. EXAM: PORTABLE CHEST 1 VIEW COMPARISON:  02/14/2017 FINDINGS: Enlarged cardiac silhouette. Calcific atherosclerotic disease and tortuosity of the aorta. Mediastinal contours appear intact. Small right and moderate left pleural effusions. Mild prominence of the interstitium. Left lower lobe airspace consolidation cannot be excluded. Osseous structures are without acute abnormality. Soft tissues are grossly normal. IMPRESSION: Bilateral pleural effusions, moderate on the left, new from the prior radiograph dated 02/14/2017. Enlarged heart with mild pulmonary vascular congestion. Left lower lobe airspace consolidation cannot be excluded. Electronically Signed   By: Ted Mcalpineobrinka  Dimitrova M.D.   On:  04/06/2017 16:17        Scheduled Meds: . allopurinol  200 mg Oral QHS  . aspirin  81 mg Oral BID  . atorvastatin  10 mg Oral QHS  . clopidogrel  75 mg Oral Daily  . docusate sodium  100 mg Oral BID  . famotidine  20 mg Oral Daily  . feeding supplement (ENSURE ENLIVE)  237 mL Oral BID BM  . fesoterodine  4 mg Oral Daily  . guaiFENesin  600 mg Oral BID  . levofloxacin  250 mg Oral Daily  . loratadine  10 mg Oral Daily  . senna  2 tablet Oral QHS  . torsemide  10 mg Oral BID   Continuous Infusions: . methocarbamol (ROBAXIN)  IV       LOS: 6 days    Time spent: 35 mins    Ramiro Harvestaniel Thompson, MD [facility] Pager [phone]  If 7PM-7AM, please contact night-coverage [email or web address] Password [email or web address] 04/07/2017, 2:51 PM

## 2017-04-07 NOTE — Discharge Instructions (Signed)
° °Dr. Jacek Colson °Joint Replacement Specialist °[facility] °[address] °[place],  [address] °[phone] ° ° °TOTAL HIP REPLACEMENT POSTOPERATIVE DIRECTIONS ° ° ° °Hip Rehabilitation, Guidelines Following Surgery  ° °WEIGHT BEARING °Weight bearing as tolerated with assist device (walker, cane, etc) as directed, use it as long as suggested by your surgeon or therapist, typically at least 4-6 weeks. ° °The results of a hip operation are greatly improved after range of motion and muscle strengthening exercises. Follow all safety measures which are given to protect your hip. If any of these exercises cause increased pain or swelling in your joint, decrease the amount until you are comfortable again. Then slowly increase the exercises. Call your caregiver if you have problems or questions.  ° °HOME CARE INSTRUCTIONS  °Most of the following instructions are designed to prevent the dislocation of your new hip.  °Remove items at home which could result in a fall. This includes throw rugs or furniture in walking pathways.  °Continue medications as instructed at time of discharge. °· You may have some home medications which will be placed on hold until you complete the course of blood thinner medication. °· You may start showering once you are discharged home. Do not remove your dressing. °Do not put on socks or shoes without following the instructions of your caregivers.   °Sit on chairs with arms. Use the chair arms to help push yourself up when arising.  °Arrange for the use of a toilet seat elevator so you are not sitting low.  °· Walk with walker as instructed.  °You may resume a sexual relationship in one month or when given the OK by your caregiver.  °Use walker as long as suggested by your caregivers.  °You may put full weight on your legs and walk as much as is comfortable. °Avoid periods of inactivity such as sitting longer than an hour when not asleep. This helps prevent  blood clots.  °You may return to work once you are cleared by your surgeon.  °Do not drive a car for 6 weeks or until released by your surgeon.  °Do not drive while taking narcotics.  °Wear elastic stockings for two weeks following surgery during the day but you may remove then at night.  °Make sure you keep all of your appointments after your operation with all of your doctors and caregivers. You should call the office at the above phone number and make an appointment for approximately two weeks after the date of your surgery. °Please pick up a stool softener and laxative for home use as long as you are requiring pain medications. °· ICE to the affected hip every three hours for 30 minutes at a time and then as needed for pain and swelling. Continue to use ice on the hip for pain and swelling from surgery. You may notice swelling that will progress down to the foot and ankle.  This is normal after surgery.  Elevate the leg when you are not up walking on it.   °It is important for you to complete the blood thinner medication as prescribed by your doctor. °· Continue to use the breathing machine which will help keep your temperature down.  It is common for your temperature to cycle up and down following surgery, especially at night when you are not up moving around and exerting yourself.  The breathing machine keeps your lungs expanded and your temperature down. ° °RANGE OF MOTION AND STRENGTHENING EXERCISES  °These exercises   are designed to help you keep full movement of your hip joint. Follow your caregiver's or physical therapist's instructions. Perform all exercises about fifteen times, three times per day or as directed. Exercise both hips, even if you have had only one joint replacement. These exercises can be done on a training (exercise) mat, on the floor, on a table or on a bed. Use whatever works the best and is most comfortable for you. Use music or television while you are exercising so that the exercises  are a pleasant break in your day. This will make your life better with the exercises acting as a break in routine you can look forward to.  Lying on your back, slowly slide your foot toward your buttocks, raising your knee up off the floor. Then slowly slide your foot back down until your leg is straight again.  Lying on your back spread your legs as far apart as you can without causing discomfort.  Lying on your side, raise your upper leg and foot straight up from the floor as far as is comfortable. Slowly lower the leg and repeat.  Lying on your back, tighten up the muscle in the front of your thigh (quadriceps muscles). You can do this by keeping your leg straight and trying to raise your heel off the floor. This helps strengthen the largest muscle supporting your knee.  Lying on your back, tighten up the muscles of your buttocks both with the legs straight and with the knee bent at a comfortable angle while keeping your heel on the floor.   SKILLED REHAB INSTRUCTIONS: If the patient is transferred to a skilled rehab facility following release from the hospital, a list of the current medications will be sent to the facility for the patient to continue.  When discharged from the skilled rehab facility, please have the facility set up the patient's Home Health Physical Therapy prior to being released. Also, the skilled facility will be responsible for providing the patient with their medications at time of release from the facility to include their pain medication and their blood thinner medication. If the patient is still at the rehab facility at time of the two week follow up appointment, the skilled rehab facility will also need to assist the patient in arranging follow up appointment in our office and any transportation needs.  MAKE SURE YOU:  Understand these instructions.  Will watch your condition.  Will get help right away if you are not doing well or get worse.  Pick up stool softner and  laxative for home use following surgery while on pain medications. Daily dressing change with telfa island dressing. Continue to use ice for pain and swelling after surgery. Do not use any lotions or creams on the incision until instructed by your surgeon. Total Hip Protocol.

## 2017-04-07 NOTE — Clinical Social Work Placement (Signed)
Patient returning to [facility]. Facility aware of patient's discharge and confirmed patient's ability to return. [facility] contacted, patient's family notified. Patient's RN can call report to [phone], packet complete. CSW signing off, no other needs identified at this time.  CLINICAL SOCIAL WORK PLACEMENT  NOTE  Date:  04/07/2017  Patient Details  Name: [name] [name] MRN: [id number] Date of Birth: 1917/05/12  Clinical Social Work is seeking post-discharge placement for this patient at the Skilled  Nursing Facility level of care (*CSW will initial, date and re-position this form in  chart as items are completed):  Yes   Patient/family provided with [facility] Clinical Social Work Department's list of facilities offering this level of care within the geographic area requested by the patient (or if unable, by the patient's family).  Yes   Patient/family informed of their freedom to choose among providers that offer the needed level of care, that participate in Medicare, Medicaid or managed care program needed by the patient, have an available bed and are willing to accept the patient.  Yes   Patient/family informed of [facility]'s ownership interest in [facility] and [facility], as well as of the fact that they are under no obligation to receive care at these facilities.  PASRR submitted to EDS on       PASRR number received on       Existing PASRR number confirmed on 04/07/17     FL2 transmitted to all facilities in geographic area requested by pt/family on 04/07/17     FL2 transmitted to all facilities within larger geographic area on       Patient informed that his/her managed care company has contracts with or will negotiate with certain facilities, including the following:        Yes(Patient returning to [facility])   Patient/family informed of bed offers received.  Patient [facility] bed at [facility]     Physician  recommends and patient chooses bed at      Patient to be [facility] to [facility] on 04/07/17.  Patient to be transferred to facility by PTAR     Patient family notified on 04/07/17 of transfer.  Name of family member notified:  Celesta GentileWendell Tollison     PHYSICIAN       Additional Comment:    _______________________________________________ Antionette PolesKimberly L Corina Stacy, LCSW 04/07/2017, 4:08 PM

## 2017-04-07 NOTE — Progress Notes (Signed)
Subjective:  Patient reports pain as mild to moderate.  Denies N/V/CP/SOB. No c/o.  Objective:   VITALS:   Vitals:   04/06/17 2135 04/07/17 0506 04/07/17 1313 04/07/17 1317  BP: 135/60 (!) 150/93 (!) 147/59 (!) 148/63  Pulse: 86 88 79 94  Resp: 15 16 16 16   Temp: 98.4 F (36.9 C) 98 F (36.7 C) 97.7 F (36.5 C) (!) 97.5 F (36.4 C)  TempSrc: Oral Oral Oral Oral  SpO2: 98% 97% 98% 95%  Weight:  67.4 kg (148 lb 9.4 oz)    Height:        NAD ABD soft Sensation intact distally Intact pulses distally Dorsiflexion/Plantar flexion intact Incision: dressing C/D/I Compartment soft prevena intact  Lab Results  Component Value Date   WBC 8.4 04/06/2017   HGB 9.5 (L) 04/06/2017   HCT 29.2 (L) 04/06/2017   MCV 86.1 04/06/2017   PLT 209 04/06/2017   BMET    Component Value Date/Time   NA 145 04/07/2017 0551   K 3.6 04/07/2017 0551   CL 116 (H) 04/07/2017 0551   CO2 21 (L) 04/07/2017 0551   GLUCOSE 115 (H) 04/07/2017 0551   BUN 22 (H) 04/07/2017 0551   CREATININE 1.00 04/07/2017 0551   CALCIUM 8.1 (L) 04/07/2017 0551   GFRNONAA 46 (L) 04/07/2017 0551   GFRAA 53 (L) 04/07/2017 0551    Results for orders placed or performed during the hospital encounter of 04/01/17  Anaerobic culture     Status: None   Collection Time: 04/01/17  5:51 PM  Result Value Ref Range Status   Specimen Description   Final    TISSUE FATTY Performed at [facility], [address] [place], [place] [address]    Special Requests   Final    NONE Performed at [facility], [address] [place], [place] [address]    Culture   Final    NO GROWTH 5 DAYS Performed at [facility], [address] [place], [place] [address]    Report Status 04/06/2017 FINAL  Final  Aerobic Culture (superficial specimen)     Status: None   Collection Time: 04/01/17  5:51 PM  Result Value Ref Range Status   Specimen Description   Final    TISSUE  FATTY Performed at [facility], [address] [place], [place] [address]    Special Requests   Final    NONE Performed at [facility], [address] [place], [place] [address]    Gram Stain NO WBC SEEN NO ORGANISMS SEEN   Final   Culture   Final    RARE ESCHERICHIA COLI CRITICAL RESULT CALLED TO, READ BACK BY AND VERIFIED WITH: E HABIB,RN AT 1325 04/03/17 BY L BENFIELD CONCERNING GROWTH ON CULTURE Performed at [facility], [address] [place], [place] [address]    Report Status 04/04/2017 FINAL  Final   Organism ID, Bacteria ESCHERICHIA COLI  Final      Susceptibility   Escherichia coli - MIC*    AMPICILLIN <=2 SENSITIVE Sensitive     CEFAZOLIN <=4 SENSITIVE Sensitive     CEFEPIME <=1 SENSITIVE Sensitive     CEFTAZIDIME <=1 SENSITIVE Sensitive     CEFTRIAXONE <=1 SENSITIVE Sensitive     CIPROFLOXACIN <=0.25 SENSITIVE Sensitive     GENTAMICIN <=1 SENSITIVE Sensitive     IMIPENEM <=0.25 SENSITIVE Sensitive     TRIMETH/SULFA <=20 SENSITIVE Sensitive  AMPICILLIN/SULBACTAM <=2 SENSITIVE Sensitive     PIP/TAZO <=4 SENSITIVE Sensitive     Extended ESBL NEGATIVE Sensitive     * RARE ESCHERICHIA COLI  Aerobic/Anaerobic Culture (surgical/deep wound)     Status: None   Collection Time: 04/01/17  6:10 PM  Result Value Ref Range Status   Specimen Description   Final    SYNOVIAL JOINT Performed at [facility], [address] [place], [place] [address]    Special Requests   Final    NONE Performed at [facility], [address], [place], [place] [address]    Gram Stain   Final    ABUNDANT WBC PRESENT,BOTH PMN AND MONONUCLEAR NO ORGANISMS SEEN    Culture   Final    No growth aerobically or anaerobically. Performed at [facility], [address] [place], [place] [address]    Report Status 04/07/2017 FINAL  Final  Aerobic/Anaerobic Culture (surgical/deep wound)     Status: None    Collection Time: 04/01/17  6:32 PM  Result Value Ref Range Status   Specimen Description   Final    HIP RIGHT PSEUDO CAPSULE Performed at [facility], [address], [place], [place] [address]    Special Requests   Final    NONE Performed at [facility], [address] [place], [place] [address]    Gram Stain   Final    RARE WBC PRESENT, PREDOMINANTLY MONONUCLEAR NO ORGANISMS SEEN    Culture   Final    No growth aerobically or anaerobically. Performed at [facility], [address] [place], [place] [address]    Report Status 04/07/2017 FINAL  Final  Aerobic/Anaerobic Culture (surgical/deep wound)     Status: None   Collection Time: 04/01/17  6:33 PM  Result Value Ref Range Status   Specimen Description   Final    HIP RIGHT PSEUDO CAPSULE 2 Performed at [facility], [address], [place], [place] [address]    Special Requests   Final    NONE Performed at [facility], [address], [place], [place] [address]    Gram Stain   Final    RARE WBC PRESENT, PREDOMINANTLY MONONUCLEAR NO ORGANISMS SEEN    Culture   Final    No growth aerobically or anaerobically. Performed at [facility], 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

## 2017-04-29 ENCOUNTER — Ambulatory Visit (INDEPENDENT_AMBULATORY_CARE_PROVIDER_SITE_OTHER): Payer: Medicare Other | Admitting: Internal Medicine

## 2017-04-29 DIAGNOSIS — T8131XD Disruption of external operation (surgical) wound, not elsewhere classified, subsequent encounter: Secondary | ICD-10-CM | POA: Diagnosis present

## 2017-04-29 MED ORDER — SULFAMETHOXAZOLE-TRIMETHOPRIM 400-80 MG PO TABS: 1.0000 | ORAL_TABLET | Freq: Two times a day (BID) | ORAL | 1 refills | Status: DC

## 2017-04-29 NOTE — Progress Notes (Signed)
[facility]  Patient Active Problem List   Diagnosis Date Noted  . History of hemiarthroplasty of right hip 04/02/2017    Priority: High  . 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 Postoperative wound dehiscence 04/01/2017    Priority: High  . Displaced fracture of right femoral neck (HCC) 02/15/2017    Priority: High  . AKI (acute kidney injury) (HCC)   . Cough   . Bilateral hearing loss   . Diabetes mellitus type 2 in nonobese (HCC)   . Stage 3 chronic kidney disease (HCC)   . Benign essential HTN   . H/O: CVA (cerebrovascular accident)   . Atelectasis of left lung 02/15/2017  . Diabetes mellitus, type II (HCC) 03/09/2011  . Anemia 03/09/2011  . Coagulopathy (HCC) 03/09/2011  . HTN (hypertension) 02/19/2011  . Dizziness 02/19/2011  . Cerebellar infarct (HCC) 02/19/2011  . H/O: CVA (cardiovascular accident) 02/19/2011  . SPONDYLOSIS 09/18/2009  . SCIATICA 09/18/2009  . SCOLIOSIS-IDIOPATHIC 09/18/2009  . SPONDYLOLITHESIS 09/18/2009    Patient's Medications  New Prescriptions   SULFAMETHOXAZOLE-TRIMETHOPRIM (BACTRIM,SEPTRA) 400-80 MG TABLET    Take 1 tablet by mouth 2 (two) times daily.  Previous Medications   ACETAMINOPHEN (TYLENOL) 325 MG TABLET    Take 2 tablets (650 mg total) by mouth every 4 (four) hours as needed for mild pain ((score 1 to 3) or temp > 100.5).   ALLOPURINOL (ZYLOPRIM) 100 MG TABLET    Take 200 mg by mouth at bedtime.    ASPIRIN 81 MG CHEWABLE TABLET    Chew 1 tablet (81 mg total) by mouth 2 (two) times daily with a meal.   ATORVASTATIN (LIPITOR) 10 MG TABLET    Take 10 mg by mouth at bedtime.    CALCIUM-MAGNESIUM-VITAMIN D (CALCIUM 500 PO)    Take 500 mg by mouth daily.   CHOLECALCIFEROL (VITAMIN D3) 1000 UNITS CAPS    Take 4,000 Units by mouth daily.   CLOPIDOGREL (PLAVIX) 75 MG TABLET    Take 75 mg by mouth daily.    DICLOFENAC SODIUM (VOLTAREN) 1 % GEL    Apply 2 g  topically 4 (four) times daily.   FAMOTIDINE (PEPCID) 20 MG TABLET    Take 20 mg by mouth daily.    FEEDING SUPPLEMENT, ENSURE ENLIVE, (ENSURE ENLIVE) LIQD    Take 237 mLs by mouth 2 (two) times daily between meals.   FESOTERODINE (TOVIAZ) 4 MG TB24 TABLET    Take 1 tablet (4 mg total) by mouth daily.   LORATADINE (CLARITIN) 10 MG TABLET    Take 10 mg by mouth daily.   MECLIZINE (ANTIVERT) 25 MG TABLET    Take 25 mg by mouth 3 (three) times daily as needed for dizziness.    ONDANSETRON (ZOFRAN) 4 MG TABLET    Take 1 tablet (4 mg total) by mouth every 6 (six) hours as needed for nausea.   SENNA-DOCUSATE (SENOKOT-S) 8.6-50 MG TABLET    Take 2 tablets by mouth 2 (two) times daily.   TORSEMIDE (DEMADEX) 10 MG TABLET    Take 1 tablet (10 mg total) by mouth daily as needed.  Modified Medications   No medications on file  Discontinued Medications   LEVOFLOXACIN (LEVAQUIN) 250 MG TABLET    Take 1 tablet (250 mg total) by mouth daily.    Subjective: [name] is in for her hospital follow-up visit with her family.  She  is a 84 y.o. female who fell and suffered a displaced right femoral neck fracture on New Year's Eve.  She underwent right hip hemiarthroplasty and was discharged to a skilled nursing facility on 03/02/2017.  Dr. Linna Caprice saw her back in the office on 03/30/2017 and discovered that she had had wound dehiscence.  He aspirated her hip and synovial fluid showed 54,000 white blood cells with 94% segmented neutrophils.  No organisms were seen on Gram stain.  She was admitted on 04/01/2017 and underwent incision and drainage and head ball exchange.  No organisms were seen on operative Gram stain.  Cultures from superficial fatty tissue grew fully sensitive E. coli.  Deep cultures were negative.  She was discharged to [facility] skilled nursing facility on oral levofloxacin.  She is feeling better.  Her sutures have been removed and her wound has been healing well.  It appears that levofloxacin was  stopped for some reason 2 days ago.  She is not having any right hip pain, fever, chills, sweats, nausea, vomiting or diarrhea.    Review of Systems: Review of Systems  Constitutional: Negative for chills, diaphoresis and fever.  Gastrointestinal: Negative for abdominal pain, diarrhea, nausea and vomiting.  Musculoskeletal: Negative for joint pain.  Skin: Negative for rash.    Past Medical History:  Diagnosis Date  . Anemia    blood transfusion during hospitalization of 02/15/2017   . Arthritis   . Chronic kidney disease    CKD- stage III  . Constipation   . Diabetes mellitus type II 02/19/2011   diet controlled   . Dizziness   . E. coli urinary tract infection   . Gout   . Gout spondylitis   . 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 History of dizziness   . History of gout   . Hyperlipidemia   . Hypertension   . Overactive bladder   . Sciatica   . Stroke [facility])     Social History   Tobacco Use  . Smoking status: Never Smoker  . Smokeless tobacco: Never Used  Substance Use Topics  . Alcohol use: No  . Drug use: No    Family History  Problem Relation Age of Onset  . Stroke Mother   . Stroke Father     Allergies  Allergen Reactions  . Bee Venom Swelling  . Penicillins Other (See Comments)    Has patient had a PCN reaction causing immediate rash, facial/tongue/throat swelling, SOB or lightheadedness with hypotension: Unknown Has patient had a PCN reaction causing severe rash involving mucus membranes or skin necrosis: Unknown Has patient had a PCN reaction that required hospitalization: Unknown Has patient had a PCN reaction occurring within the last 10 years: Unknown If all of the above answers are "NO", then may proceed with Cephalosporin use.     Objective: Vitals:   04/29/17 1350  BP: 123/75  Pulse: (!) 110  Temp: (!) 97.5 F (36.4 C)  TempSrc: Oral   There is no height or weight on file to calculate  BMI.  Physical Exam  Constitutional: She is oriented to person, place, and time.  She is seated in her wheelchair surrounded by family members.  She is in good spirits.  Musculoskeletal:  I did not attempt to examine her wound as her family was able to describe its appearance well.  Neurological: She is alert and oriented to person, place, and time.  Skin: No rash noted.  Psychiatric: Mood and affect normal.  Lab Results Sed Rate (mm/hr)  Date Value  04/01/2017 70 (H)   CRP (mg/dL)  Date Value  32/35/5732 4.0 (H)     Problem List Items Addressed This Visit      High   Postoperative wound dehiscence    She is improving on therapy for postoperative E. coli wound infection.  Although deep wound specimens were culture negative there is always concern about early infection involving her hip prosthesis.  I favor continuing antibiotic therapy for at least 2 more months.  I will start her on single strength trimethoprim sulfamethoxazole (better safety profile than long-term levofloxacin) and see her back in 4 weeks.  She will get repeat blood work today.      Relevant Medications   sulfamethoxazole-trimethoprim (BACTRIM,SEPTRA) 400-80 MG tablet   Other Relevant Orders   C-reactive protein   Sedimentation rate   CBC   Basic metabolic panel       Cliffton Asters, MD [facility] [phone] pager   [phone] cell 04/29/2017, 2:20 PM

## 2017-04-29 NOTE — Assessment & Plan Note (Addendum)
She is improving on therapy for postoperative E. coli wound infection.  Although deep wound specimens were culture negative there is always concern about early infection involving her hip prosthesis.  I favor continuing antibiotic therapy for at least 2 more months.  I will start her on single strength trimethoprim sulfamethoxazole (better safety profile than long-term levofloxacin) and see her back in 4 weeks.  She will get repeat blood work today.

## 2017-04-30 LAB — BASIC METABOLIC PANEL
BUN/Creatinine Ratio: 15 (calc) (ref 6–22)
BUN: 20 mg/dL (ref 7–25)
CO2: 33 mmol/L — ABNORMAL HIGH (ref 20–32)
CREATININE: 1.32 mg/dL — AB (ref 0.60–0.88)
Calcium: 9.1 mg/dL (ref 8.6–10.4)
Chloride: 102 mmol/L (ref 98–110)
Glucose, Bld: 102 mg/dL — ABNORMAL HIGH (ref 65–99)
Potassium: 3.6 mmol/L (ref 3.5–5.3)
Sodium: 148 mmol/L — ABNORMAL HIGH (ref 135–146)

## 2017-04-30 LAB — CBC
HCT: 38.1 % (ref 35.0–45.0)
HEMOGLOBIN: 12.2 g/dL (ref 11.7–15.5)
MCH: 27.9 pg (ref 27.0–33.0)
MCHC: 32 g/dL (ref 32.0–36.0)
MCV: 87 fL (ref 80.0–100.0)
MPV: 10.6 fL (ref 7.5–12.5)
PLATELETS: 206 10*3/uL (ref 140–400)
RBC: 4.38 10*6/uL (ref 3.80–5.10)
RDW: 16.6 % — ABNORMAL HIGH (ref 11.0–15.0)
WBC: 7.5 10*3/uL (ref 3.8–10.8)

## 2017-04-30 LAB — SEDIMENTATION RATE: SED RATE: 33 mm/h — AB (ref 0–30)

## 2017-04-30 LAB — C-REACTIVE PROTEIN: CRP: 22.2 mg/L — AB (ref <8.0)

## 2017-05-20 ENCOUNTER — Telehealth: Payer: Self-pay | Admitting: Internal Medicine

## 2017-05-20 NOTE — Telephone Encounter (Signed)
I received an office note from Dr. Samson FredericBrian Swinteck.  He saw Ms. Goetzke yesterday and has recommended chronic suppressive antibiotic therapy for right prosthetic hip infection.

## 2017-05-31 ENCOUNTER — Ambulatory Visit (INDEPENDENT_AMBULATORY_CARE_PROVIDER_SITE_OTHER): Payer: Medicare Other | Admitting: Internal Medicine

## 2017-05-31 ENCOUNTER — Encounter: Payer: Self-pay | Admitting: *Deleted

## 2017-05-31 ENCOUNTER — Encounter: Payer: Self-pay | Admitting: Internal Medicine

## 2017-05-31 DIAGNOSIS — T8451XA Infection and inflammatory reaction due to internal right hip prosthesis, initial encounter: Secondary | ICD-10-CM | POA: Diagnosis present

## 2017-05-31 DIAGNOSIS — T8451XD Infection and inflammatory reaction due to internal right hip prosthesis, subsequent encounter: Secondary | ICD-10-CM | POA: Diagnosis not present

## 2017-05-31 NOTE — Progress Notes (Signed)
Patient Active Problem List   Diagnosis Date Noted  . History of hemiarthroplasty of right hip 04/02/2017    Priority: High  . 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 Postoperative wound dehiscence 04/01/2017    Priority: High  . Displaced fracture of right femoral neck (HCC) 02/15/2017    Priority: High  . AKI (acute kidney injury) (HCC)   . Cough   . Bilateral hearing loss   . Diabetes mellitus type 2 in nonobese (HCC)   . Stage 3 chronic kidney disease (HCC)   . Benign essential HTN   . H/O: CVA (cerebrovascular accident)   . Atelectasis of left lung 02/15/2017  . Diabetes mellitus, type II (HCC) 03/09/2011  . Anemia 03/09/2011  . Coagulopathy (HCC) 03/09/2011  . HTN (hypertension) 02/19/2011  . Dizziness 02/19/2011  . Cerebellar infarct (HCC) 02/19/2011  . H/O: CVA (cardiovascular accident) 02/19/2011  . SPONDYLOSIS 09/18/2009  . SCIATICA 09/18/2009  . SCOLIOSIS-IDIOPATHIC 09/18/2009  . SPONDYLOLITHESIS 09/18/2009    Patient's Medications  New Prescriptions   No medications on file  Previous Medications   ACETAMINOPHEN (TYLENOL) 325 MG TABLET    Take 2 tablets (650 mg total) by mouth every 4 (four) hours as needed for mild pain ((score 1 to 3) or temp > 100.5).   ALLOPURINOL (ZYLOPRIM) 100 MG TABLET    Take 200 mg by mouth at bedtime.    ASPIRIN 81 MG CHEWABLE TABLET    Chew 1 tablet (81 mg total) by mouth 2 (two) times daily with a meal.   ATORVASTATIN (LIPITOR) 10 MG TABLET    Take 10 mg by mouth at bedtime.    CALCIUM-MAGNESIUM-VITAMIN D (CALCIUM 500 PO)    Take 500 mg by mouth daily.   CHOLECALCIFEROL (VITAMIN D3) 1000 UNITS CAPS    Take 4,000 Units by mouth daily.   CLOPIDOGREL (PLAVIX) 75 MG TABLET    Take 75 mg by mouth daily.    DICLOFENAC SODIUM (VOLTAREN) 1 % GEL    Apply 2 g topically 4 (four) times daily.   FAMOTIDINE (PEPCID) 20 MG TABLET    Take 20 mg by mouth daily.    FEEDING SUPPLEMENT, ENSURE  ENLIVE, (ENSURE ENLIVE) LIQD    Take 237 mLs by mouth 2 (two) times daily between meals.   FESOTERODINE (TOVIAZ) 4 MG TB24 TABLET    Take 1 tablet (4 mg total) by mouth daily.   LORATADINE (CLARITIN) 10 MG TABLET    Take 10 mg by mouth daily.   MECLIZINE (ANTIVERT) 25 MG TABLET    Take 25 mg by mouth 3 (three) times daily as needed for dizziness.    ONDANSETRON (ZOFRAN) 4 MG TABLET    Take 1 tablet (4 mg total) by mouth every 6 (six) hours as needed for nausea.   SENNA-DOCUSATE (SENOKOT-S) 8.6-50 MG TABLET    Take 2 tablets by mouth 2 (two) times daily.   SULFAMETHOXAZOLE-TRIMETHOPRIM (BACTRIM,SEPTRA) 400-80 MG TABLET    Take 1 tablet by mouth 2 (two) times daily.   TORSEMIDE (DEMADEX) 10 MG TABLET    Take 1 tablet (10 mg total) by mouth daily as needed.  Modified Medications   No medications on file  Discontinued Medications   No medications on file    Subjective: [name] is in for her routine follow-up visit.  She is now completed 2 months of antibiotic therapy for postoperative E. coli wound infection of  her right hip.  She is feeling much better.  Her wound has completely healed.  She has not had any problems tolerating her trimethoprim sulfamethoxazole.  Review of Systems: Review of Systems  Constitutional: Negative for chills, diaphoresis and fever.  Gastrointestinal: Negative for abdominal pain, diarrhea, nausea and vomiting.    Past Medical History:  Diagnosis Date  . Anemia    blood transfusion during hospitalization of 02/15/2017   . Arthritis   . Chronic kidney disease    CKD- stage III  . Constipation   . Diabetes mellitus type II 02/19/2011   diet controlled   . Dizziness   . E. coli urinary tract infection   . Gout   . Gout spondylitis   . 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 History of dizziness   . History of gout   . Hyperlipidemia   . Hypertension   . Overactive bladder   . Sciatica   . Stroke [facility])     Social  History   Tobacco Use  . Smoking status: Never Smoker  . Smokeless tobacco: Never Used  Substance Use Topics  . Alcohol use: No  . Drug use: No    Family History  Problem Relation Age of Onset  . Stroke Mother   . Stroke Father     Allergies  Allergen Reactions  . Bee Venom Swelling  . Penicillins Other (See Comments)    Has patient had a PCN reaction causing immediate rash, facial/tongue/throat swelling, SOB or lightheadedness with hypotension: Unknown Has patient had a PCN reaction causing severe rash involving mucus membranes or skin necrosis: Unknown Has patient had a PCN reaction that required hospitalization: Unknown Has patient had a PCN reaction occurring within the last 10 years: Unknown If all of the above answers are "NO", then may proceed with Cephalosporin use.     Health Maintenance  Topic Date Due  . FOOT EXAM  06/11/1927  . OPHTHALMOLOGY EXAM  06/11/1927  . URINE MICROALBUMIN  06/11/1927  . TETANUS/TDAP  06/12/1938  . DEXA SCAN  06/11/1984  . PNA vac Low Risk Adult (1 of 2 - PCV13) 06/11/1984  . INFLUENZA VACCINE  09/16/2017  . HEMOGLOBIN A1C  09/29/2017    Objective:  Vitals:   05/31/17 1457  BP: 131/77  Pulse: 74  Temp: 97.9 F (36.6 C)  TempSrc: Oral   There is no height or weight on file to calculate BMI.  Physical Exam  Constitutional:  She is seated in a wheelchair.  Her granddaughter confirmed that her wound is healed without evidence of infection.    Lab Results Lab Results  Component Value Date   WBC 7.5 04/29/2017   HGB 12.2 04/29/2017   HCT 38.1 04/29/2017   MCV 87.0 04/29/2017   PLT 206 04/29/2017    Lab Results  Component Value Date   CREATININE 1.32 (H) 04/29/2017   BUN 20 04/29/2017   NA 148 (H) 04/29/2017   K 3.6 04/29/2017   CL 102 04/29/2017   CO2 33 (H) 04/29/2017    Lab Results  Component Value Date   ALT 74 (H) 02/19/2017   AST 77 (H) 02/19/2017   ALKPHOS 83 02/19/2017   BILITOT 1.3 (H) 02/19/2017      Lab Results  Component Value Date   CHOL 100 02/20/2011   HDL 55 02/20/2011   LDLCALC 30 02/20/2011   TRIG 77 02/20/2011   CHOLHDL 1.8 02/20/2011   No results found for: LABRPR, RPRTITER No  results found for: HIV1RNAQUANT, HIV1RNAVL, CD4TABS   Problem List Items Addressed This Visit      High   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 coli infection of her right hip.  It is unclear if her hardware is infected.  The only way to have her know would be to stop antibiotics and wait to see what happens.  I discussed this with her family today and recommended that she simply stay on 1 trimethoprim sulfamethoxazole tablet daily indefinitely as long as she is tolerating it well.  I asked them to notify me right away if she were to develop watery diarrhea, pruritic rash or other signs of possible adverse reaction to her antibiotic.  She will follow-up in 3 months.           Cliffton Asters, MD [facility] for Infectious Disease [facility] [phone] pager   [phone] cell 05/31/2017, 4:00 PM

## 2017-05-31 NOTE — Assessment & Plan Note (Signed)
She is doing better on therapy for E. coli infection of her right hip.  It is unclear if her hardware is infected.  The only way to have her know would be to stop antibiotics and wait to see what happens.  I discussed this with her family today and recommended that she simply stay on 1 trimethoprim sulfamethoxazole tablet daily indefinitely as long as she is tolerating it well.  I asked them to notify me right away if she were to develop watery diarrhea, pruritic rash or other signs of possible adverse reaction to her antibiotic.  She will follow-up in 3 months.

## 2017-09-02 ENCOUNTER — Ambulatory Visit: Payer: Medicare Other | Admitting: Internal Medicine

## 2017-09-14 ENCOUNTER — Ambulatory Visit: Payer: Medicare Other | Admitting: Internal Medicine

## 2017-09-23 ENCOUNTER — Encounter: Payer: Self-pay | Admitting: Internal Medicine

## 2017-09-23 ENCOUNTER — Ambulatory Visit (INDEPENDENT_AMBULATORY_CARE_PROVIDER_SITE_OTHER): Payer: Medicare Other | Admitting: Internal Medicine

## 2017-09-23 DIAGNOSIS — T8451XD Infection and inflammatory reaction due to internal right hip prosthesis, subsequent encounter: Secondary | ICD-10-CM | POA: Diagnosis present

## 2017-09-23 NOTE — Assessment & Plan Note (Signed)
I talked to them again about the options for management of her postop wound infection.  At the time of surgery it was not absolutely clear if the infection tracked deep to the fracture site and her hardware.  The only way to know if her infection is cured would be to stop her antibiotic and wait and see what happens.  They favor continuing the antibiotic for now.  If she were to develop watery diarrhea or other signs of potential side effects of trimethoprim sulfamethoxazole I would need to know that right away.  We would need to obtain testing for C. difficile colitis and consider stopping her antibiotic.  She will follow-up here in 3 months.

## 2017-09-23 NOTE — Progress Notes (Signed)
Patient Active Problem List   Diagnosis Date Noted  . History of hemiarthroplasty of right hip 04/02/2017    Priority: High  . 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 Postoperative wound dehiscence 04/01/2017    Priority: High  . Displaced fracture of right femoral neck (HCC) 02/15/2017    Priority: High  . AKI (acute kidney injury) (HCC)   . Cough   . Bilateral hearing loss   . Diabetes mellitus type 2 in nonobese (HCC)   . Stage 3 chronic kidney disease (HCC)   . Benign essential HTN   . H/O: CVA (cerebrovascular accident)   . Atelectasis of left lung 02/15/2017  . Diabetes mellitus, type II (HCC) 03/09/2011  . Anemia 03/09/2011  . Coagulopathy (HCC) 03/09/2011  . HTN (hypertension) 02/19/2011  . Dizziness 02/19/2011  . Cerebellar infarct (HCC) 02/19/2011  . H/O: CVA (cardiovascular accident) 02/19/2011  . SPONDYLOSIS 09/18/2009  . SCIATICA 09/18/2009  . SCOLIOSIS-IDIOPATHIC 09/18/2009  . SPONDYLOLITHESIS 09/18/2009    Patient's Medications  New Prescriptions   No medications on file  Previous Medications   ACETAMINOPHEN (TYLENOL) 325 MG TABLET    Take 2 tablets (650 mg total) by mouth every 4 (four) hours as needed for mild pain ((score 1 to 3) or temp > 100.5).   ALLOPURINOL (ZYLOPRIM) 100 MG TABLET    Take 200 mg by mouth at bedtime.    ASPIRIN 81 MG CHEWABLE TABLET    Chew 1 tablet (81 mg total) by mouth 2 (two) times daily with a meal.   ATORVASTATIN (LIPITOR) 10 MG TABLET    Take 10 mg by mouth at bedtime.    CALCIUM-MAGNESIUM-VITAMIN D (CALCIUM 500 PO)    Take 500 mg by mouth daily.   CHOLECALCIFEROL (VITAMIN D3) 1000 UNITS CAPS    Take 4,000 Units by mouth daily.   CLOPIDOGREL (PLAVIX) 75 MG TABLET    Take 75 mg by mouth daily.    DICLOFENAC SODIUM (VOLTAREN) 1 % GEL    Apply 2 g topically 4 (four) times daily.   FAMOTIDINE (PEPCID) 20 MG TABLET    Take 20 mg by mouth daily.    FEEDING SUPPLEMENT, ENSURE  ENLIVE, (ENSURE ENLIVE) LIQD    Take 237 mLs by mouth 2 (two) times daily between meals.   FESOTERODINE (TOVIAZ) 4 MG TB24 TABLET    Take 1 tablet (4 mg total) by mouth daily.   LORATADINE (CLARITIN) 10 MG TABLET    Take 10 mg by mouth daily.   MECLIZINE (ANTIVERT) 25 MG TABLET    Take 25 mg by mouth 3 (three) times daily as needed for dizziness.    ONDANSETRON (ZOFRAN) 4 MG TABLET    Take 1 tablet (4 mg total) by mouth every 6 (six) hours as needed for nausea.   SENNA-DOCUSATE (SENOKOT-S) 8.6-50 MG TABLET    Take 2 tablets by mouth 2 (two) times daily.   SULFAMETHOXAZOLE-TRIMETHOPRIM (BACTRIM,SEPTRA) 400-80 MG TABLET    Take 1 tablet by mouth 2 (two) times daily.   TORSEMIDE (DEMADEX) 10 MG TABLET    Take 1 tablet (10 mg total) by mouth daily as needed.  Modified Medications   No medications on file  Discontinued Medications   No medications on file    Subjective: [name] [name] is in for her routine follow-up visit.  She has now completed 6 months of antibiotic therapy for postoperative E. coli wound infection  of her right hip.  She is feeling much better and making some progress with [facility].  She is unable to stand on her own but is able to get up with her physical therapist and take a few steps with a walker she is not having any hip pain.  Her wound has completely healed.  She has not had any problems tolerating her trimethoprim sulfamethoxazole other than having more frequent, soft stools.  She has not had any watery diarrhea.  Review of Systems: Review of Systems  Constitutional: Negative for chills, diaphoresis and fever.  Gastrointestinal: Negative for abdominal pain, diarrhea, nausea and vomiting.    Past Medical History:  Diagnosis Date  . Anemia    blood transfusion during hospitalization of 02/15/2017   . Arthritis   . Chronic kidney disease    CKD- stage III  . Constipation   . Diabetes mellitus type II 02/19/2011   diet controlled   .  Dizziness   . E. coli urinary tract infection   . Gout   . Gout spondylitis   . 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 History of dizziness   . History of gout   . Hyperlipidemia   . Hypertension   . Overactive bladder   . Sciatica   . Stroke [facility](HCC)     Social History   Tobacco Use  . Smoking status: Never Smoker  . Smokeless tobacco: Never Used  Substance Use Topics  . Alcohol use: No  . Drug use: No    Family History  Problem Relation Age of Onset  . Stroke Mother   . Stroke Father     Allergies  Allergen Reactions  . Bee Venom Swelling  . Penicillins Other (See Comments)    Has patient had a PCN reaction causing immediate rash, facial/tongue/throat swelling, SOB or lightheadedness with hypotension: Unknown Has patient had a PCN reaction causing severe rash involving mucus membranes or skin necrosis: Unknown Has patient had a PCN reaction that required hospitalization: Unknown Has patient had a PCN reaction occurring within the last 10 years: Unknown If all of the above answers are "NO", then may proceed with Cephalosporin use.     Health Maintenance  Topic Date Due  . FOOT EXAM  06/11/1929  . OPHTHALMOLOGY EXAM  06/11/1929  . URINE MICROALBUMIN  06/11/1929  . TETANUS/TDAP  06/12/1938  . DEXA SCAN  06/11/1984  . PNA vac Low Risk Adult (1 of 2 - PCV13) 06/11/1984  . INFLUENZA VACCINE  09/16/2017  . HEMOGLOBIN A1C  09/29/2017    Objective:  Vitals:   09/23/17 1507  BP: (!) 145/55  Pulse: (!) 54  Temp: 97.7 F (36.5 C)   There is no height or weight on file to calculate BMI.  Physical Exam  Constitutional: She is oriented to person, place, and time.  She is accompanied by her son. She is seated in a wheelchair.    Abdominal: Soft. She exhibits no distension. There is no tenderness.  Musculoskeletal:  Her right hip incision has healed.  Neurological: She is alert and oriented to person, place, and time.  Skin:  No rash noted.    Lab Results Lab Results  Component Value Date   WBC 7.5 04/29/2017   HGB 12.2 04/29/2017   HCT 38.1 04/29/2017   MCV 87.0 04/29/2017   PLT 206 04/29/2017    Lab Results  Component Value Date   CREATININE 1.32 (H) 04/29/2017   BUN  20 04/29/2017   NA 148 (H) 04/29/2017   K 3.6 04/29/2017   CL 102 04/29/2017   CO2 33 (H) 04/29/2017    Lab Results  Component Value Date   ALT 74 (H) 02/19/2017   AST 77 (H) 02/19/2017   ALKPHOS 83 02/19/2017   BILITOT 1.3 (H) 02/19/2017    Lab Results  Component Value Date   CHOL 100 02/20/2011   HDL 55 02/20/2011   LDLCALC 30 02/20/2011   TRIG 77 02/20/2011   CHOLHDL 1.8 02/20/2011   No results found for: LABRPR, RPRTITER No results found for: HIV1RNAQUANT, HIV1RNAVL, CD4TABS   Problem List Items Addressed This Visit      High   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  At the time of surgery it was not absolutely clear if the infection tracked deep to the fracture site and her hardware.  The only way to know if her infection is cured would be to stop her antibiotic and wait and see what happens.  They favor continuing the antibiotic for now.  If she were to develop watery diarrhea or other signs of potential side effects of trimethoprim sulfamethoxazole I would need to know that right away.  We would need to obtain testing for C. difficile colitis and consider stopping her antibiotic.  She will follow-up here in 3 months.           Cliffton Asters, MD [facility] [phone] pager   [phone] cell 09/23/2017, 3:26 PM

## 2017-10-11 ENCOUNTER — Ambulatory Visit (INDEPENDENT_AMBULATORY_CARE_PROVIDER_SITE_OTHER): Payer: Medicare Other | Admitting: Vascular Surgery

## 2017-10-11 ENCOUNTER — Other Ambulatory Visit: Payer: Self-pay

## 2017-10-11 ENCOUNTER — Encounter: Payer: Self-pay | Admitting: Vascular Surgery

## 2017-10-11 ENCOUNTER — Encounter: Payer: Self-pay | Admitting: *Deleted

## 2017-10-11 ENCOUNTER — Other Ambulatory Visit: Payer: Self-pay | Admitting: *Deleted

## 2017-10-11 VITALS — BP 133/51 | HR 93 | Temp 97.4°F | Resp 14

## 2017-10-11 DIAGNOSIS — I739 Peripheral vascular disease, unspecified: Secondary | ICD-10-CM | POA: Diagnosis not present

## 2017-10-11 NOTE — Progress Notes (Signed)
Patient ID: [name] [name], female   DOB: [birth year], 84 y.o.   MRN: [id number]  Reason for Consult: New Patient (Initial Visit) (abnormal arterial studies)   Referred by Reynolds Bowl, MD  Subjective:     HPI:  [name] [name] is a 84 y.o. female with history of chronic kidney disease.  She also has diabetes.  Currently living in the [facility].  She does walk with the help of an aide.  She has a toe ulcer on her left great toe that she states is been present for 2 to 3 months.  She is undergone previous DVT studies as well as arterial lower extremity studies.  She presents today for further evaluation.  Past Medical History:  Diagnosis Date  . Anemia    blood transfusion during hospitalization of 02/15/2017   . Arthritis   . Chronic kidney disease    CKD- stage III  . Constipation   . Diabetes mellitus type II 02/19/2011   diet controlled   . Dizziness   . E. coli urinary tract infection   . Gout   . Gout spondylitis   . 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 History of dizziness   . History of gout   . Hyperlipidemia   . Hypertension   . Overactive bladder   . Sciatica   . Stroke [facility])    Family History  Problem Relation Age of Onset  . Stroke Mother   . Stroke Father    Past Surgical History:  Procedure Laterality Date  . ABDOMINAL HYSTERECTOMY    . HIP PINNING,CANNULATED Right 02/15/2017   Procedure: ANTERIOR APPROACH HEMI HIP ARTHROPLASTY;  Surgeon: Samson Frederic, MD;  Location: [facility] OR;  Service: Orthopedics;  Laterality: Right;  . INCISION AND DRAINAGE HIP Right 04/01/2017   Procedure: DEBRIDEMENT OF RIGHT HIP,  HEAD BALL EXCHANGE;  Surgeon: Samson Frederic, MD;  Location: [facility] ORS;  Service: Orthopedics;  Laterality: Right;  Needs RNFA    Short Social History:  Social History   Tobacco Use  . Smoking status: Never Smoker  . Smokeless tobacco: Never Used  Substance Use Topics  . Alcohol use: No    Allergies    Allergen Reactions  . Bee Venom Swelling  . Penicillins Other (See Comments)    Has patient had a PCN reaction causing immediate rash, facial/tongue/throat swelling, SOB or lightheadedness with hypotension: Unknown Has patient had a PCN reaction causing severe rash involving mucus membranes or skin necrosis: Unknown Has patient had a PCN reaction that required hospitalization: Unknown Has patient had a PCN reaction occurring within the last 10 years: Unknown If all of the above answers are "NO", then may proceed with Cephalosporin use.     Current Outpatient Medications  Medication Sig Dispense Refill  . acetaminophen (TYLENOL) 325 MG tablet Take 2 tablets (650 mg total) by mouth every 4 (four) hours as needed for mild pain ((score 1 to 3) or temp > 100.5). 120 tablet 0  . allopurinol (ZYLOPRIM) 100 MG tablet Take 200 mg by mouth at bedtime.     Marland Kitchen aspirin 81 MG chewable tablet Chew 1 tablet (81 mg total) by mouth 2 (two) times daily with a meal. 60 tablet 1  . atorvastatin (LIPITOR) 10 MG tablet Take 10 mg by mouth at bedtime.     . Calcium-Magnesium-Vitamin D (CALCIUM 500 PO) Take 500 mg by mouth daily.    . Cholecalciferol (VITAMIN D3) 1000 units CAPS Take 4,000 Units by  mouth daily.    . clopidogrel (PLAVIX) 75 MG tablet Take 75 mg by mouth daily.     . diclofenac sodium (VOLTAREN) 1 % GEL Apply 2 g topically 4 (four) times daily.    . famotidine (PEPCID) 20 MG tablet Take 20 mg by mouth daily.     . feeding supplement, ENSURE ENLIVE, (ENSURE ENLIVE) LIQD Take 237 mLs by mouth 2 (two) times daily between meals. 237 mL 12  . fesoterodine (TOVIAZ) 4 MG TB24 tablet Take 1 tablet (4 mg total) by mouth daily. 30 tablet   . loratadine (CLARITIN) 10 MG tablet Take 10 mg by mouth daily.    . meclizine (ANTIVERT) 25 MG tablet Take 25 mg by mouth 3 (three) times daily as needed for dizziness.     . ondansetron (ZOFRAN) 4 MG tablet Take 1 tablet (4 mg total) by mouth every 6 (six) hours as needed  for nausea. 20 tablet 0  . senna-docusate (SENOKOT-S) 8.6-50 MG tablet Take 2 tablets by mouth 2 (two) times daily.    Marland Kitchen. sulfamethoxazole-trimethoprim (BACTRIM,SEPTRA) 400-80 MG tablet Take 1 tablet by mouth 2 (two) times daily. 60 tablet 1  . torsemide (DEMADEX) 10 MG tablet Take 1 tablet (10 mg total) by mouth daily as needed. (Patient taking differently: Take 5 mg by mouth daily. ) 30 tablet 0   No current facility-administered medications for this visit.     Review of Systems  Constitutional:  Constitutional negative. HENT: HENT negative.  Eyes: Eyes negative.  Respiratory: Respiratory negative.  Cardiovascular: Cardiovascular negative.  GI: Gastrointestinal negative.  Skin: Positive for wound.  Neurological: Neurological negative. Hematologic: Hematologic/lymphatic negative.  Psychiatric: Psychiatric negative.        Objective:  Objective   Vitals:   10/11/17 1141  BP: (!) 133/51  Pulse: 93  Resp: 14  Temp: (!) 97.4 F (36.3 C)  TempSrc: Oral  SpO2: 100%   There is no height or weight on file to calculate BMI.  Physical Exam  Constitutional: She appears well-developed.  HENT:  Head: Normocephalic.  Cardiovascular: Normal rate.  Pulses:      Carotid pulses are 2+ on the right side, and 2+ on the left side.      Femoral pulses are 2+ on the right side, and 2+ on the left side.      Popliteal pulses are 0 on the right side, and 0 on the left side.       Dorsalis pedis pulses are 0 on the right side, and 0 on the left side.       Posterior tibial pulses are 0 on the right side, and 0 on the left side.  No carotid bruits bilaterally Monophasic dorsalis pedis PT signals on the left No signals discernible on the right    Data: Previously performed bilateral lower extremity duplex reviewed which demonstrated moderate to severe diffuse bilateral peripheral vascular disease with a focal stenosis in her distal left femoral artery.     Assessment/Plan:      84 year old female presents with left great toe ulceration history of chronic kidney disease and diabetes although her last creatinine measured was 1.3.  She will need angiogram with left lower extremity runoff possible intervention.  Depending on the level of her kidney function at the time we may need CO2 given that she has femoral pulses I do not think she will need any aortoiliac intervention.  She does not have any discernible signals in her right foot but also no wounds.  Left foot does have monophasic PT and DP signals but she also has a toe ulceration that has been present for several months currently not infected.  I have asked the [facility] to begin aspirin and we will get her set up for angiogram in the near future.     Maeola Harman MD [facility]

## 2017-10-13 ENCOUNTER — Encounter: Payer: Self-pay | Admitting: Geriatric Medicine

## 2017-10-25 ENCOUNTER — Encounter (HOSPITAL_COMMUNITY)
Admission: RE | Disposition: A | Discharge status: Skilled Nursing Facility | Payer: Self-pay | Source: Ambulatory Visit | Attending: Vascular Surgery

## 2017-10-25 ENCOUNTER — Encounter (HOSPITAL_COMMUNITY): Payer: Self-pay | Admitting: Vascular Surgery

## 2017-10-25 ENCOUNTER — Other Ambulatory Visit: Payer: Self-pay

## 2017-10-25 ENCOUNTER — Observation Stay (HOSPITAL_COMMUNITY)
Admission: RE | Admit: 2017-10-25 | Discharge: 2017-10-25 | Disposition: A | Discharge status: Skilled Nursing Facility | Payer: Medicare Other | Source: Ambulatory Visit | Attending: Vascular Surgery | Admitting: Vascular Surgery

## 2017-10-25 DIAGNOSIS — I70245 Atherosclerosis of native arteries of left leg with ulceration of other part of foot: Principal | ICD-10-CM | POA: Insufficient documentation

## 2017-10-25 DIAGNOSIS — I998 Other disorder of circulatory system: Secondary | ICD-10-CM | POA: Diagnosis not present

## 2017-10-25 DIAGNOSIS — L97529 Non-pressure chronic ulcer of other part of left foot with unspecified severity: Secondary | ICD-10-CM | POA: Diagnosis present

## 2017-10-25 HISTORY — PX: ABDOMINAL AORTOGRAM W/LOWER EXTREMITY: CATH118223

## 2017-10-25 HISTORY — PX: PERIPHERAL VASCULAR BALLOON ANGIOPLASTY: CATH118281

## 2017-10-25 LAB — POCT I-STAT, CHEM 8
BUN: 42 mg/dL — ABNORMAL HIGH (ref 8–23)
CREATININE: 1.6 mg/dL — AB (ref 0.44–1.00)
Calcium, Ion: 1.36 mmol/L (ref 1.15–1.40)
Chloride: 115 mmol/L — ABNORMAL HIGH (ref 98–111)
GLUCOSE: 83 mg/dL (ref 70–99)
HCT: 35 % — ABNORMAL LOW (ref 36.0–46.0)
HEMOGLOBIN: 11.9 g/dL — AB (ref 12.0–15.0)
Potassium: 4.8 mmol/L (ref 3.5–5.1)
Sodium: 143 mmol/L (ref 135–145)
TCO2: 20 mmol/L — AB (ref 22–32)

## 2017-10-25 SURGERY — ABDOMINAL AORTOGRAM W/LOWER EXTREMITY
Anesthesia: LOCAL

## 2017-10-25 MED ORDER — ACETAMINOPHEN 325 MG PO TABS: 650.0000 mg | ORAL_TABLET | ORAL | Status: DC | PRN

## 2017-10-25 MED ORDER — HYDRALAZINE HCL 20 MG/ML IJ SOLN: 5.0000 mg | INTRAMUSCULAR | Status: DC | PRN

## 2017-10-25 MED ORDER — CLOPIDOGREL BISULFATE 75 MG PO TABS: 75.0000 mg | ORAL_TABLET | Freq: Every day | ORAL | Status: DC

## 2017-10-25 MED ORDER — LIDOCAINE HCL (PF) 1 % IJ SOLN
INTRAMUSCULAR | Status: DC | PRN
  Administered 2017-10-25: 12 mL via INTRADERMAL

## 2017-10-25 MED ORDER — SODIUM CHLORIDE 0.9% FLUSH: 3.0000 mL | INTRAVENOUS | Status: DC | PRN

## 2017-10-25 MED ORDER — SODIUM CHLORIDE 0.9% FLUSH: 3.0000 mL | Freq: Two times a day (BID) | INTRAVENOUS | Status: DC

## 2017-10-25 MED ORDER — LIDOCAINE HCL (PF) 1 % IJ SOLN
INTRAMUSCULAR | Status: AC
  Filled 2017-10-25: qty 30

## 2017-10-25 MED ORDER — CLOPIDOGREL BISULFATE 75 MG PO TABS: 75.0000 mg | ORAL_TABLET | Freq: Every day | ORAL | 2 refills | Status: AC

## 2017-10-25 MED ORDER — CLOPIDOGREL BISULFATE 75 MG PO TABS: 300.0000 mg | ORAL_TABLET | Freq: Once | ORAL | Status: DC

## 2017-10-25 MED ORDER — OXYCODONE HCL 5 MG PO TABS: 5.0000 mg | ORAL_TABLET | ORAL | Status: DC | PRN

## 2017-10-25 MED ORDER — CLOPIDOGREL BISULFATE 300 MG PO TABS
ORAL_TABLET | ORAL | Status: AC
  Filled 2017-10-25: qty 1

## 2017-10-25 MED ORDER — SODIUM CHLORIDE 0.9 % WEIGHT BASED INFUSION: 1.0000 mL/kg/h | INTRAVENOUS | Status: DC

## 2017-10-25 MED ORDER — HEPARIN SODIUM (PORCINE) 1000 UNIT/ML IJ SOLN
INTRAMUSCULAR | Status: DC | PRN
  Administered 2017-10-25: 7000 [IU] via INTRAVENOUS

## 2017-10-25 MED ORDER — ONDANSETRON HCL 4 MG/2ML IJ SOLN: 4.0000 mg | Freq: Four times a day (QID) | INTRAMUSCULAR | Status: DC | PRN

## 2017-10-25 MED ORDER — IODIXANOL 320 MG/ML IV SOLN
INTRAVENOUS | Status: DC | PRN
  Administered 2017-10-25: 10 mL via INTRA_ARTERIAL

## 2017-10-25 MED ORDER — HEPARIN SODIUM (PORCINE) 1000 UNIT/ML IJ SOLN
INTRAMUSCULAR | Status: AC
  Filled 2017-10-25: qty 1

## 2017-10-25 MED ORDER — HEPARIN (PORCINE) IN NACL 1000-0.9 UT/500ML-% IV SOLN
INTRAVENOUS | Status: DC | PRN
  Administered 2017-10-25 (×2): 500 mL

## 2017-10-25 MED ORDER — HEPARIN (PORCINE) IN NACL 1000-0.9 UT/500ML-% IV SOLN
INTRAVENOUS | Status: AC
  Filled 2017-10-25: qty 500

## 2017-10-25 MED ORDER — SODIUM CHLORIDE 0.9 % IV SOLN: 250.0000 mL | INTRAVENOUS | Status: DC | PRN

## 2017-10-25 MED ORDER — SODIUM CHLORIDE 0.9 % IV SOLN
INTRAVENOUS | Status: DC
  Administered 2017-10-25: 09:00:00 via INTRAVENOUS

## 2017-10-25 MED ORDER — LABETALOL HCL 5 MG/ML IV SOLN: 10.0000 mg | INTRAVENOUS | Status: DC | PRN

## 2017-10-25 SURGICAL SUPPLY — 22 items
BALLN LUTONIX DCB 4X40X130 (BALLOONS) ×3
BALLN MUSTANG 4.0X40 75 (BALLOONS) ×3
BALLN MUSTANG 4X40X135 (BALLOONS) ×3
BALLOON LUTONIX DCB 4X40X130 (BALLOONS) ×2 IMPLANT
BALLOON MUSTANG 4.0X40 75 (BALLOONS) ×2 IMPLANT
BALLOON MUSTANG 4X40X135 (BALLOONS) ×2 IMPLANT
CATH OMNI FLUSH 5F 65CM (CATHETERS) ×3 IMPLANT
DEVICE CLOSURE MYNXGRIP 5F (Vascular Products) ×3 IMPLANT
FILTER CO2 0.2 MICRON (VASCULAR PRODUCTS) ×3 IMPLANT
KIT ENCORE 26 ADVANTAGE (KITS) ×3 IMPLANT
KIT MICROPUNCTURE NIT STIFF (SHEATH) ×3 IMPLANT
KIT PV (KITS) ×3 IMPLANT
RESERVOIR CO2 (VASCULAR PRODUCTS) ×3 IMPLANT
SET FLUSH CO2 (MISCELLANEOUS) ×3 IMPLANT
SHEATH DESTINATION MP 5FR 45CM (SHEATH) ×3 IMPLANT
SHEATH PINNACLE 5F 10CM (SHEATH) ×3 IMPLANT
SHEATH PROBE COVER 6X72 (BAG) ×3 IMPLANT
SYR MEDRAD MARK V 150ML (SYRINGE) ×3 IMPLANT
TRANSDUCER W/STOPCOCK (MISCELLANEOUS) ×3 IMPLANT
TRAY PV CATH (CUSTOM PROCEDURE TRAY) ×3 IMPLANT
WIRE BENTSON .035X145CM (WIRE) ×3 IMPLANT
WIRE HI TORQ VERSACORE J 260CM (WIRE) ×3 IMPLANT

## 2017-10-25 NOTE — H&P (Signed)
   History and Physical Update  The patient was interviewed and re-examined.  The patient's previous History and Physical has been reviewed and is unchanged from recent office visit. Plan for left lower extremity angiogram and possible intervention.  Haytham Maher C. Randie Heinz, MD [facility] [place] Office: [phone] Pager: [phone]   10/25/2017, 9:05 AM

## 2017-10-25 NOTE — Progress Notes (Addendum)
1200 scant amount of blood on dressing/ pressure held x 2 for 10 minutes. Dressing was removed appears to have a nicked skin bleed/ new dressing applied.. Dr Randie Heinz was informed and came after his case to assess pt. 30 minutes went by and no further bleeding. Pt may be DC'd, report was called to the [facility] [place]/ report given to [facility] lpn. paperwork was given to the son for the [facility].

## 2017-10-25 NOTE — Discharge Instructions (Signed)

## 2017-10-25 NOTE — Progress Notes (Signed)
Attempted to call [facility] several times with no answer. Unable to update med rec. Melissa Montane informed of Creat level.

## 2017-10-25 NOTE — Op Note (Signed)
    Patient name: [name] [name] MRN: [id number] DOB: 1917-04-10 Sex: female  10/25/2017 Pre-operative Diagnosis: Critical left lower extremity ischemia Post-operative diagnosis:  Same Surgeon:  Apolinar Junes C. Randie Heinz, MD Procedure Performed: 1.  Ultrasound-guided cannulation right common femoral artery 2.  CO2 aortogram with left lower extremity angiogram 3.  Drug-coated balloon angioplasty left popliteal artery 4.  Mynx device closure right common femoral artery   Indications: 84 year old female without significant history of arterial disease presents with left great toe ulceration.  She has an elevated creatinine.  She indicated for angiogram with CO2 and possible intervention of the left lower extremity.  Findings: The left popliteal artery has 90% stenosis.  After intervention there is 0% residual stenosis and no dissection.  She has runoff via the peroneal artery which gives rise to a PT and an anterior tibial artery that appears to give out at the level of the foot.   Procedure:  The patient was identified in the holding area and taken to room 8.  The patient was then placed supine on the table and prepped and draped in the usual sterile fashion.  A time out was called.  Ultrasound was used to evaluate the right common femoral artery which is noted to be calcified but patent.  This was cannulated with direct ultrasound guidance with micropuncture needle followed by wire and sheath.  An image was saved the permanent record.  We placed a Bentson wire followed by 5 [place] sheath.  An Omni catheter was placed to the aorta and CO2 aortogram was performed.  We then crossed the bifurcation with Bentson and Omni catheter performed CO2 angiogram left lower extremity.  There was a concerning area around the knee and we used 5 cc contrast injection there which demonstrated 90% stenosis.  With this we exchanged for long 5 French sheath in the left SFA.  Patient was given 7000 units of heparin.  We were able to  cross the nearly occluded area with versa core wire performed balloon angioplasty with 4 mm balloon for 1 minute at nominal pressure.  We then placed a lutonix 4 mm x 40 nn balloon inflated to nominal pressure for 3 minutes.  Following this completion angiogram demonstrated no residual dissection or stenosis.  Satisfied we exchanged for a short 5 [place] sheath deployed a minx device.  This deployed well.  She tolerated procedure well without immediate complication.  Next  Contrast 10 cc.   Brandon C. Randie Heinz, MD [facility] [place] Office: [phone] Pager: [phone]

## 2017-10-26 MED FILL — Clopidogrel Bisulfate Tab 300 MG (Base Equiv): ORAL | Qty: 1 | Status: AC

## 2017-10-27 ENCOUNTER — Other Ambulatory Visit: Payer: Self-pay

## 2017-10-27 DIAGNOSIS — I739 Peripheral vascular disease, unspecified: Secondary | ICD-10-CM

## 2017-11-26 ENCOUNTER — Ambulatory Visit (INDEPENDENT_AMBULATORY_CARE_PROVIDER_SITE_OTHER): Payer: Medicare Other | Admitting: Physician Assistant

## 2017-11-26 ENCOUNTER — Ambulatory Visit (HOSPITAL_COMMUNITY)
Admission: RE | Admit: 2017-11-26 | Discharge: 2017-11-26 | Disposition: A | Discharge status: Home / Self Care | Payer: Medicare Other | Source: Ambulatory Visit | Attending: Vascular Surgery | Admitting: Vascular Surgery

## 2017-11-26 ENCOUNTER — Other Ambulatory Visit: Payer: Self-pay

## 2017-11-26 ENCOUNTER — Ambulatory Visit (INDEPENDENT_AMBULATORY_CARE_PROVIDER_SITE_OTHER)
Admission: RE | Admit: 2017-11-26 | Discharge: 2017-11-26 | Disposition: A | Discharge status: Home / Self Care | Payer: Medicare Other | Source: Ambulatory Visit | Attending: Vascular Surgery | Admitting: Vascular Surgery

## 2017-11-26 VITALS — BP 145/58 | HR 47 | Temp 97.0°F | Resp 20 | Ht <= 58 in | Wt 148.0 lb

## 2017-11-26 DIAGNOSIS — I739 Peripheral vascular disease, unspecified: Secondary | ICD-10-CM | POA: Insufficient documentation

## 2017-11-26 NOTE — Progress Notes (Signed)
HISTORY AND PHYSICAL     CC:  Follow up Requesting Provider:  Reynolds Bowl, MD  HPI: This is a 84 y.o. female who underwent: Procedure Performed: 1.  Ultrasound-guided cannulation right common femoral artery 2.  CO2 aortogram with left lower extremity angiogram 3.  Drug-coated balloon angioplasty left popliteal artery 4.  Mynx device closure right common femoral artery By Dr. Randie Heinz on 10/25/17 for a left great toe ulceration with hx of elevated creatinine.   She presents today for follow up with ABI's and arterial duplex.   She tells me that her wound on her left great toe has gotten better.  She has occasional pain in her foot but not all the time.   She states that she has sore on her heel also.  She is wearing a boot. She says they are floating her heels off the bed.  She continues to take her asa/plavix.  She does not walk much but can take a few steps. She continues to take Bactrim.  Past Medical History:  Diagnosis Date  . Anemia    blood transfusion during hospitalization of 02/15/2017   . Arthritis   . Chronic kidney disease    CKD- stage III  . Constipation   . Diabetes mellitus type II 02/19/2011   diet controlled   . Dizziness   . E. coli urinary tract infection   . Gout   . Gout spondylitis   . 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 History of dizziness   . History of gout   . Hyperlipidemia   . Hypertension   . Overactive bladder   . Sciatica   . Stroke [facility])     Past Surgical History:  Procedure Laterality Date  . ABDOMINAL AORTOGRAM W/LOWER EXTREMITY N/A 10/25/2017   Procedure: ABDOMINAL AORTOGRAM W/LOWER EXTREMITY;  Surgeon: Maeola Harman, MD;  Location: [facility] INVASIVE CV LAB;  Service: Cardiovascular;  Laterality: N/A;  . ABDOMINAL HYSTERECTOMY    . HIP PINNING,CANNULATED Right 02/15/2017   Procedure: ANTERIOR APPROACH HEMI HIP ARTHROPLASTY;  Surgeon: Samson Frederic, MD;  Location: [facility] OR;  Service:  Orthopedics;  Laterality: Right;  . INCISION AND DRAINAGE HIP Right 04/01/2017   Procedure: DEBRIDEMENT OF RIGHT HIP,  HEAD BALL EXCHANGE;  Surgeon: Samson Frederic, MD;  Location: [facility] ORS;  Service: Orthopedics;  Laterality: Right;  Needs RNFA  . PERIPHERAL VASCULAR BALLOON ANGIOPLASTY Left 10/25/2017   Procedure: PERIPHERAL VASCULAR BALLOON ANGIOPLASTY;  Surgeon: Maeola Harman, MD;  Location: [facility] INVASIVE CV LAB;  Service: Cardiovascular;  Laterality: Left;  popliteal    Allergies  Allergen Reactions  . Bee Venom Swelling  . Penicillins Other (See Comments)    Has patient had a PCN reaction causing immediate rash, facial/tongue/throat swelling, SOB or lightheadedness with hypotension: Unknown Has patient had a PCN reaction causing severe rash involving mucus membranes or skin necrosis: Unknown Has patient had a PCN reaction that required hospitalization: Unknown Has patient had a PCN reaction occurring within the last 10 years: Unknown If all of the above answers are "NO", then may proceed with Cephalosporin use.     Current Outpatient Medications  Medication Sig Dispense Refill  . acetaminophen (TYLENOL) 325 MG tablet Take 2 tablets (650 mg total) by mouth every 4 (four) hours as needed for mild pain ((score 1 to 3) or temp > 100.5). (Patient not taking: Reported on 10/20/2017) 120 tablet 0  . allopurinol (ZYLOPRIM) 100 MG tablet Take 100 mg by mouth See admin instructions.  Take 100 mg by mouth every Monday and Thursday.    Marland Kitchen aspirin 81 MG chewable tablet Chew 1 tablet (81 mg total) by mouth 2 (two) times daily with a meal. (Patient taking differently: Chew 81 mg by mouth daily. ) 60 tablet 1  . Calcium-Magnesium-Vitamin D (CALCIUM 500 PO) Take 500 mg by mouth daily.    . Cholecalciferol (VITAMIN D3) 1000 units CAPS Take 4,000 Units by mouth daily.    . clopidogrel (PLAVIX) 75 MG tablet Take 1 tablet (75 mg total) by mouth daily. 30 tablet 2  . diclofenac sodium (VOLTAREN) 1 % GEL  Apply 2 g topically 4 (four) times daily. (Patient not taking: Reported on 10/20/2017)    . feeding supplement, ENSURE ENLIVE, (ENSURE ENLIVE) LIQD Take 237 mLs by mouth 2 (two) times daily between meals. (Patient not taking: Reported on 10/20/2017) 237 mL 12  . fesoterodine (TOVIAZ) 4 MG TB24 tablet Take 1 tablet (4 mg total) by mouth daily. (Patient not taking: Reported on 10/20/2017) 30 tablet   . mirtazapine (REMERON) 7.5 MG tablet Take 7.5 mg by mouth at bedtime.    . ondansetron (ZOFRAN) 4 MG tablet Take 1 tablet (4 mg total) by mouth every 6 (six) hours as needed for nausea. (Patient not taking: Reported on 10/20/2017) 20 tablet 0  . senna-docusate (SENOKOT-S) 8.6-50 MG tablet Take 2 tablets by mouth 2 (two) times daily.    Marland Kitchen sulfamethoxazole-trimethoprim (BACTRIM,SEPTRA) 400-80 MG tablet Take 1 tablet by mouth 2 (two) times daily. (Patient taking differently: Take 1 tablet by mouth daily. ) 60 tablet 1  . torsemide (DEMADEX) 10 MG tablet Take 1 tablet (10 mg total) by mouth daily as needed. (Patient not taking: Reported on 10/20/2017) 30 tablet 0   No current facility-administered medications for this visit.     Family History  Problem Relation Age of Onset  . Stroke Mother   . Stroke Father     Social History   Socioeconomic History  . Marital status: Widowed    Spouse name: Not on file  . Number of children: Not on file  . Years of education: Not on file  . Highest education level: Not on file  Occupational History  . Not on file  Social Needs  . Financial resource strain: Not on file  . Food insecurity:    Worry: Not on file    Inability: Not on file  . Transportation needs:    Medical: Not on file    Non-medical: Not on file  Tobacco Use  . Smoking status: Never Smoker  . Smokeless tobacco: Never Used  Substance and Sexual Activity  . Alcohol use: No  . Drug use: No  . Sexual activity: Not Currently  Lifestyle  . Physical activity:    Days per week: Not on file     Minutes per session: Not on file  . Stress: Not on file  Relationships  . Social connections:    Talks on phone: Not on file    Gets together: Not on file    Attends religious service: Not on file    Active member of club or organization: Not on file    Attends meetings of clubs or organizations: Not on file    Relationship status: Not on file  . Intimate partner violence:    Fear of current or ex partner: Not on file    Emotionally abused: Not on file    Physically abused: Not on file    Forced sexual activity:  Not on file  Other Topics Concern  . Not on file  Social History Narrative  . Not on file     REVIEW OF SYSTEMS:   [X]  denotes positive finding, [ ]  denotes negative finding Cardiac  Comments:  Chest pain or chest pressure:    Shortness of breath upon exertion:    Short of breath when lying flat:    Irregular heart rhythm:        Vascular    Pain in calf, thigh, or hip brought on by ambulation: x   Pain in feet at night that wakes you up from your sleep:  x   Blood clot in your veins:    Leg swelling:  x       Pulmonary    Oxygen at home:    Productive cough:     Wheezing:         Neurologic    Sudden weakness in arms or legs:  x   Sudden numbness in arms or legs:  x   Sudden onset of difficulty speaking or slurred speech:    Temporary loss of vision in one eye:     Problems with dizziness:         Gastrointestinal    Blood in stool:     Vomited blood:         Genitourinary    Burning when urinating:     Blood in urine:        Psychiatric    Major depression:         Hematologic    Bleeding problems:    Problems with blood clotting too easily:        Skin    Rashes or ulcers:        Constitutional    Fever or chills:      PHYSICAL EXAMINATION:  Vitals:   11/26/17 1337  BP: (!) 145/58  Pulse: (!) 47  Resp: 20  Temp: (!) 97 F (36.1 C)  SpO2: 99%   Vitals:   11/26/17 1337  Weight: 148 lb (67.1 kg)  Height: 4\' 10"  (1.473 m)    Body mass index is 30.93 kg/m.  General:  WDWN in NAD; vital signs documented above Gait: Not observed-in wheelchair HENT: WNL, normocephalic Pulmonary: normal non-labored breathing Cardiac: regular HR, without  Murmurs without carotid bruits Abdomen: soft, NT, no masses Skin: without rashes Vascular Exam/Pulses: +palpable left DP pulse; left foot is warm +palpable bilateral radial pulses Extremities: left great toe wound measures 1x1cm and is superficial; left heal wound measures ~ 0.5x0.5cm superficial      Musculoskeletal: no muscle wasting or atrophy  Neurologic: A&O X 3;  No focal weakness or paresthesias are detected Psychiatric:  The pt has Normal affect.   Non-Invasive Vascular Imaging:   ABI's 11/26/17: Right:  0.61 TBI 0.56 Left:  0.73 TBI (ulcer)  Left Lower arterial duplex 11/26/17: Stenosis in the prox/mid SFA 30-49% and in the mid/distal SFA 50-74%.  Peroneal artery not visualized.   Arterial duplex 09/15/17 [facility]) -focal stenosis of left distal femoral artery  Pt meds includes: Statin:  No. Beta Blocker:  No. Aspirin:  Yes.   ACEI:  No. ARB:  No. CCB use:  No Other Antiplatelet/Anticoagulant:  Yes Plavix   ASSESSMENT/PLAN:: 84 y.o. female s/p: 1.  Ultrasound-guided cannulation right common femoral artery 2.  CO2 aortogram with left lower extremity angiogram 3.  Drug-coated balloon angioplasty left popliteal artery 4.  Mynx device closure right common femoral artery  By Dr. Randie Heinz on 10/25/17 here today for follow up   -pt's has a palpable left DP pulse.  The velocities in the distal SFA have significantly improved since undergoing balloon angioplasty of the left popliteal artery. -The wound on her toe appears to be improving.  There is no drainage from this and does not appear infected.  -she also has a small ulcer on her left heel-she is wearing a boot to offload pressure.  I have advised she float her heels off the bed while in bed and  discussed with her and her family member why this is important.  Will have her f/u with Dr. Randie Heinz in 3-4 weeks to check these ulcers to make sure they are improving.  She will call if these areas worsen in the meantime.   -continue current wound care with silvadene. -continue plavix/asa daily.   Doreatha Massed, PA-C [facility] [phone]  Clinic MD:  None

## 2017-12-17 ENCOUNTER — Ambulatory Visit: Payer: Medicare Other | Admitting: Vascular Surgery

## 2017-12-17 ENCOUNTER — Ambulatory Visit: Payer: Medicare Other | Admitting: Family

## 2017-12-20 ENCOUNTER — Encounter: Payer: Self-pay | Admitting: Family

## 2017-12-27 ENCOUNTER — Ambulatory Visit (INDEPENDENT_AMBULATORY_CARE_PROVIDER_SITE_OTHER): Payer: Medicare Other | Admitting: Internal Medicine

## 2017-12-27 ENCOUNTER — Encounter: Payer: Self-pay | Admitting: Internal Medicine

## 2017-12-27 DIAGNOSIS — Z7982 Long term (current) use of aspirin: Secondary | ICD-10-CM | POA: Diagnosis not present

## 2017-12-27 DIAGNOSIS — Z96641 Presence of right artificial hip joint: Secondary | ICD-10-CM | POA: Diagnosis not present

## 2017-12-27 DIAGNOSIS — B9620 Unspecified Escherichia coli [E. coli] as the cause of diseases classified elsewhere: Secondary | ICD-10-CM

## 2017-12-27 DIAGNOSIS — Z880 Allergy status to penicillin: Secondary | ICD-10-CM

## 2017-12-27 DIAGNOSIS — T8451XD Infection and inflammatory reaction due to internal right hip prosthesis, subsequent encounter: Secondary | ICD-10-CM

## 2017-12-27 DIAGNOSIS — Z91030 Bee allergy status: Secondary | ICD-10-CM

## 2017-12-27 DIAGNOSIS — T8149XD Infection following a procedure, other surgical site, subsequent encounter: Secondary | ICD-10-CM | POA: Diagnosis not present

## 2017-12-27 DIAGNOSIS — Z792 Long term (current) use of antibiotics: Secondary | ICD-10-CM

## 2017-12-27 NOTE — Progress Notes (Signed)
Patient Active Problem List   Diagnosis Date Noted  . History of hemiarthroplasty of right hip 04/02/2017    Priority: High  . 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 Postoperative wound dehiscence 04/01/2017    Priority: High  . Displaced fracture of right femoral neck (HCC) 02/15/2017    Priority: High  . AKI (acute kidney injury) (HCC)   . Cough   . Bilateral hearing loss   . Diabetes mellitus type 2 in nonobese (HCC)   . Stage 3 chronic kidney disease (HCC)   . Benign essential HTN   . H/O: CVA (cerebrovascular accident)   . Atelectasis of left lung 02/15/2017  . Diabetes mellitus, type II (HCC) 03/09/2011  . Anemia 03/09/2011  . Coagulopathy (HCC) 03/09/2011  . HTN (hypertension) 02/19/2011  . Dizziness 02/19/2011  . Cerebellar infarct (HCC) 02/19/2011  . H/O: CVA (cardiovascular accident) 02/19/2011  . SPONDYLOSIS 09/18/2009  . SCIATICA 09/18/2009  . SCOLIOSIS-IDIOPATHIC 09/18/2009  . SPONDYLOLITHESIS 09/18/2009    Patient's Medications  New Prescriptions   No medications on file  Previous Medications   ACETAMINOPHEN (TYLENOL) 325 MG TABLET    Take 2 tablets (650 mg total) by mouth every 4 (four) hours as needed for mild pain ((score 1 to 3) or temp > 100.5).   ALLOPURINOL (ZYLOPRIM) 100 MG TABLET    Take 100 mg by mouth See admin instructions. Take 100 mg by mouth every Monday and Thursday.   ASPIRIN 81 MG CHEWABLE TABLET    Chew 1 tablet (81 mg total) by mouth 2 (two) times daily with a meal.   CALCIUM-MAGNESIUM-VITAMIN D (CALCIUM 500 PO)    Take 500 mg by mouth daily.   CHOLECALCIFEROL (VITAMIN D3) 1000 UNITS CAPS    Take 4,000 Units by mouth daily.   CLOPIDOGREL (PLAVIX) 75 MG TABLET    Take 1 tablet (75 mg total) by mouth daily.   DICLOFENAC SODIUM (VOLTAREN) 1 % GEL    Apply 2 g topically 4 (four) times daily.   FEEDING SUPPLEMENT, ENSURE ENLIVE, (ENSURE ENLIVE) LIQD    Take 237 mLs by mouth 2 (two) times  daily between meals.   MIRTAZAPINE (REMERON) 7.5 MG TABLET    Take 7.5 mg by mouth at bedtime.   ONDANSETRON (ZOFRAN) 4 MG TABLET    Take 1 tablet (4 mg total) by mouth every 6 (six) hours as needed for nausea.   SENNA-DOCUSATE (SENOKOT-S) 8.6-50 MG TABLET    Take 2 tablets by mouth 2 (two) times daily.   SILVER SULFADIAZINE (SILVADENE) 1 % CREAM       SULFAMETHOXAZOLE-TRIMETHOPRIM (BACTRIM,SEPTRA) 400-80 MG TABLET    Take 1 tablet by mouth 2 (two) times daily.   TORSEMIDE (DEMADEX) 10 MG TABLET    Take 1 tablet (10 mg total) by mouth daily as needed.  Modified Medications   No medications on file  Discontinued Medications   No medications on file    Subjective: [name] [name] is in for her routine follow-up visit.  She has now completed 9 months of antibiotic therapy for postoperative E. coli wound infection of her right hip.  She is feeling much better and making some progress with physical therapy at the [facility].  She is not having any hip pain.  Her wound has completely healed.  She has not had any problems tolerating her trimethoprim sulfamethoxazole other than having more frequent, soft stools.  She  has not had any watery diarrhea.  Review of Systems: Review of Systems  Constitutional: Negative for chills, diaphoresis and fever.  Gastrointestinal: Negative for abdominal pain, diarrhea, nausea and vomiting.    Past Medical History:  Diagnosis Date  . Anemia    blood transfusion during hospitalization of 02/15/2017   . Arthritis   . Chronic kidney disease    CKD- stage III  . Constipation   . Diabetes mellitus type II 02/19/2011   diet controlled   . Dizziness   . E. coli urinary tract infection   . Gout   . Gout spondylitis   . 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 History of dizziness   . History of gout   . Hyperlipidemia   . Hypertension   . Overactive bladder   . Sciatica   . Stroke [facility])     Social History   Tobacco Use    . Smoking status: Never Smoker  . Smokeless tobacco: Never Used  Substance Use Topics  . Alcohol use: No  . Drug use: No    Family History  Problem Relation Age of Onset  . Stroke Mother   . Stroke Father     Allergies  Allergen Reactions  . Bee Venom Swelling  . Penicillins Other (See Comments)    Has patient had a PCN reaction causing immediate rash, facial/tongue/throat swelling, SOB or lightheadedness with hypotension: Unknown Has patient had a PCN reaction causing severe rash involving mucus membranes or skin necrosis: Unknown Has patient had a PCN reaction that required hospitalization: Unknown Has patient had a PCN reaction occurring within the last 10 years: Unknown If all of the above answers are "NO", then may proceed with Cephalosporin use.     Health Maintenance  Topic Date Due  . FOOT EXAM  [birth year]29  . OPHTHALMOLOGY EXAM  06/11/1929  . URINE MICROALBUMIN  06/11/1929  . TETANUS/TDAP  06/12/1938  . DEXA SCAN  06/11/1984  . PNA vac Low Risk Adult (1 of 2 - PCV13) 06/11/1984  . INFLUENZA VACCINE  09/16/2017  . HEMOGLOBIN A1C  09/29/2017    Objective:  Vitals:   12/27/17 1516  BP: (!) 148/57  Pulse: (!) 59  Temp: (!) 97.5 F (36.4 C)  TempSrc: Oral   There is no height or weight on file to calculate BMI.  Physical Exam  Constitutional: She is oriented to person, place, and time.  She is accompanied by her son. She is seated in a wheelchair.    Abdominal: Soft. She exhibits no distension. There is no tenderness.  Neurological: She is alert and oriented to person, place, and time.  Skin: No rash noted.    Lab Results Lab Results  Component Value Date   WBC 7.5 04/29/2017   HGB 11.9 (L) 10/25/2017   HCT 35.0 (L) 10/25/2017   MCV 87.0 04/29/2017   PLT 206 04/29/2017    Lab Results  Component Value Date   CREATININE 1.60 (H) 10/25/2017   BUN 42 (H) 10/25/2017   NA 143 10/25/2017   K 4.8 10/25/2017   CL 115 (H) 10/25/2017   CO2 33 (H)  04/29/2017    Lab Results  Component Value Date   ALT 74 (H) 02/19/2017   AST 77 (H) 02/19/2017   ALKPHOS 83 02/19/2017   BILITOT 1.3 (H) 02/19/2017    Lab Results  Component Value Date   CHOL 100 02/20/2011   HDL 55 02/20/2011   LDLCALC 30 02/20/2011  TRIG 77 02/20/2011   CHOLHDL 1.8 02/20/2011   No results found for: LABRPR, RPRTITER No results found for: HIV1RNAQUANT, HIV1RNAVL, CD4TABS   Problem List Items Addressed This Visit      High   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  They prefer for her to continue taking it as long as she is tolerating it well.  She will follow-up here in 6 months.           Cliffton Asters, MD [facility] [phone] pager   [phone] cell 12/27/2017, 3:32 PM

## 2017-12-27 NOTE — Assessment & Plan Note (Signed)
She is doing well and tolerating chronic suppressive trimethoprim sulfamethoxazole.  They prefer for her to continue taking it as long as she is tolerating it well.  She will follow-up here in 6 months.

## 2018-01-03 ENCOUNTER — Encounter: Payer: Self-pay | Admitting: Family

## 2018-01-03 ENCOUNTER — Other Ambulatory Visit: Payer: Self-pay

## 2018-01-03 ENCOUNTER — Ambulatory Visit (INDEPENDENT_AMBULATORY_CARE_PROVIDER_SITE_OTHER): Payer: Medicare Other | Admitting: Family

## 2018-01-03 VITALS — BP 121/57 | HR 46 | Temp 97.1°F | Resp 16 | Ht <= 58 in | Wt 113.0 lb

## 2018-01-03 DIAGNOSIS — I779 Disorder of arteries and arterioles, unspecified: Secondary | ICD-10-CM

## 2018-01-03 NOTE — Progress Notes (Signed)
[facility] [place]   CC: Follow up peripheral artery occlusive disease  History of Present Illness [name] [name] is a 84 y.o. female who is s/p  1. Ultrasound-guided cannulation right common femoral artery 2.CO2 aortogram with left lower extremity angiogram 3.Drug-coated balloon angioplasty left popliteal artery 4.Mynx device closure right common femoral artery By Dr. Randie Heinz on 10/25/17 for a left great toe ulceration with hx of elevated creatinine.   She presents today for follow up wounds check of left foot.   She has occasional pain in her foot but not all the time. She is wearing a soft bootie in bed, according to her son. She says they are floating her heels off the bed.  She continues to take her asa/plavix.  She does not walk much but can take a few steps.   Pt was last evaluated on 11-26-17 by S. Rhynes PA-C. At that time pt had a palpable left DP pulse.  The velocities in the distal SFA had significantly improved since undergoing balloon angioplasty of the left popliteal artery. The wound on her toe appeared to be improving.  There was no drainage from this and did not appear infected.  Small ulcer on her left heel-she was wearing a boot to offload pressure.  [name] advised that pt float her heels off the bed and while in bed and discussed with her and her family member why this is important.  Will have her f/u with Dr. Randie Heinz in 3-4 weeks to check these ulcers to make sure they are improving.  She was to call if these areas worsen in the meantime.   -continue current wound care with silvadene. -continue plavix/asa daily.  She is not ambulatory except to pivot, as son describes.   Pt is a resident of [facility].   Diabetic: A1C 6.2 on 04-01-17, 1.6 serum creatinine on 10-25-17 Tobacco use: non-smoker  Pt meds include: Statin :No Betablocker: No ASA: Yes Other anticoagulants/antiplatelets: Plavix  Past Medical History:  Diagnosis Date  .  Anemia    blood transfusion during hospitalization of 02/15/2017   . Arthritis   . Chronic kidney disease    CKD- stage III  . Constipation   . Diabetes mellitus type II 02/19/2011   diet controlled   . Dizziness   . E. coli urinary tract infection   . Gout   . Gout spondylitis   . 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 History of dizziness   . History of gout   . Hyperlipidemia   . Hypertension   . Overactive bladder   . Sciatica   . Stroke [facility])     Social History Social History   Tobacco Use  . Smoking status: Never Smoker  . Smokeless tobacco: Never Used  Substance Use Topics  . Alcohol use: No  . Drug use: No    Family History Family History  Problem Relation Age of Onset  . Stroke Mother   . Stroke Father     Past Surgical History:  Procedure Laterality Date  . ABDOMINAL AORTOGRAM W/LOWER EXTREMITY N/A 10/25/2017   Procedure: ABDOMINAL AORTOGRAM W/LOWER EXTREMITY;  Surgeon: Maeola Harman, MD;  Location: [facility] INVASIVE CV LAB;  Service: Cardiovascular;  Laterality: N/A;  . ABDOMINAL HYSTERECTOMY    . HIP PINNING,CANNULATED Right 02/15/2017   Procedure: ANTERIOR APPROACH HEMI HIP ARTHROPLASTY;  Surgeon: Samson Frederic, MD;  Location: [facility] OR;  Service: Orthopedics;  Laterality: Right;  . INCISION AND DRAINAGE  HIP Right 04/01/2017   Procedure: DEBRIDEMENT OF RIGHT HIP,  HEAD BALL EXCHANGE;  Surgeon: Samson Frederic, MD;  Location: [facility] ORS;  Service: Orthopedics;  Laterality: Right;  Needs RNFA  . PERIPHERAL VASCULAR BALLOON ANGIOPLASTY Left 10/25/2017   Procedure: PERIPHERAL VASCULAR BALLOON ANGIOPLASTY;  Surgeon: Maeola Harman, MD;  Location: [facility] INVASIVE CV LAB;  Service: Cardiovascular;  Laterality: Left;  popliteal    Allergies  Allergen Reactions  . Bee Venom Swelling  . Penicillins Other (See Comments)    Has patient had a PCN reaction causing immediate rash, facial/tongue/throat swelling, SOB or  lightheadedness with hypotension: Unknown Has patient had a PCN reaction causing severe rash involving mucus membranes or skin necrosis: Unknown Has patient had a PCN reaction that required hospitalization: Unknown Has patient had a PCN reaction occurring within the last 10 years: Unknown If all of the above answers are "NO", then may proceed with Cephalosporin use.     Current Outpatient Medications  Medication Sig Dispense Refill  . acetaminophen (TYLENOL) 325 MG tablet Take 2 tablets (650 mg total) by mouth every 4 (four) hours as needed for mild pain ((score 1 to 3) or temp > 100.5). 120 tablet 0  . allopurinol (ZYLOPRIM) 100 MG tablet Take 100 mg by mouth See admin instructions. Take 100 mg by mouth every Monday and Thursday.    Marland Kitchen aspirin 81 MG chewable tablet Chew 1 tablet (81 mg total) by mouth 2 (two) times daily with a meal. (Patient taking differently: Chew 81 mg by mouth daily. ) 60 tablet 1  . Calcium-Magnesium-Vitamin D (CALCIUM 500 PO) Take 500 mg by mouth daily.    . Cholecalciferol (VITAMIN D3) 1000 units CAPS Take 4,000 Units by mouth daily.    . clopidogrel (PLAVIX) 75 MG tablet Take 1 tablet (75 mg total) by mouth daily. 30 tablet 2  . diclofenac sodium (VOLTAREN) 1 % GEL Apply 2 g topically 4 (four) times daily.    . mirtazapine (REMERON) 7.5 MG tablet Take 7.5 mg by mouth at bedtime.    . ondansetron (ZOFRAN) 4 MG tablet Take 1 tablet (4 mg total) by mouth every 6 (six) hours as needed for nausea. 20 tablet 0  . silver sulfADIAZINE (SILVADENE) 1 % cream     . sulfamethoxazole-trimethoprim (BACTRIM,SEPTRA) 400-80 MG tablet Take 1 tablet by mouth 2 (two) times daily. (Patient taking differently: Take 1 tablet by mouth daily. ) 60 tablet 1  . torsemide (DEMADEX) 10 MG tablet Take 1 tablet (10 mg total) by mouth daily as needed. 30 tablet 0  . feeding supplement, ENSURE ENLIVE, (ENSURE ENLIVE) LIQD Take 237 mLs by mouth 2 (two) times daily between meals. 237 mL 12  .  senna-docusate (SENOKOT-S) 8.6-50 MG tablet Take 2 tablets by mouth 2 (two) times daily.     No current facility-administered medications for this visit.     ROS: See HPI for pertinent positives and negatives.   Physical Examination  Vitals:   01/03/18 1149  BP: (!) 121/57  Pulse: (!) 46  Resp: 16  Temp: (!) 97.1 F (36.2 C)  TempSrc: Oral  Weight: 113 lb (51.3 kg)  Height: 4\' 10"  (1.473 m)   Body mass index is 23.62 kg/m.  General: A&O x 3, WDWN, elderly female. Gait: seated in her w/c HENT: No gross abnormalities.  Eyes: PERRLA. Pulmonary: Respirations are non labored, CTAB, good air movement in all fields Cardiac: irregular rhythm, bradycardic (not on a beta blocker), no detected murmur.  Radial pulses are 2+ palpable bilaterally   Adominal aortic pulse is not palpable            Left great toe ulcer mostly healed  Unable to obtain photo of left heel, but left heel wound has healed.                    VASCULAR EXAM: Extremities without ischemic changes, without Gangrene; with small shallow open wound at dorsal aspect of left great toe. 1-2+ soft non pitting edema of left forefoot. Left first metatarsal joint is enlarged.                                                                                                            LE Pulses Right Left       POPLITEAL  not palpable   not palpable       POSTERIOR TIBIAL  not palpable   not palpable        DORSALIS PEDIS      ANTERIOR TIBIAL not palpable  1+ palpable    Abdomen: soft, NT, no palpable masses. Skin: no rashes, no cellulitis, see Extremities.  Musculoskeletal: no muscle wasting or atrophy.  Neurologic: A&O X 3; appropriate affect, Sensation is normal; MOTOR FUNCTION:  moving all extremities equally, motor strength 5/5 throughout. Speech is fluent/normal. CN 2-12 intact except has some hearing loss. Psychiatric: Thought content is normal, mood appropriate for clinical situation.      ASSESSMENT: [name] [name] is a 84 y.o. female who is s/p balloon angioplasty of left popliteal artery on 10-25-17 by Dr. Randie Heinzain for ulcers on left heel and left great toe. Left heel ulcer has healed, left great toe ulcer has mostly healed. Son states pt has arthritis and gout, possible tophaceous gout in left great toe, 1-2+ non pitting swelling in left great toe and left forefoot.   Pt does not walk much, she [facility]. She gets around in her w/c.    PLAN:  Based on the patient's HPI and examination, pt will return to clinic in January 2020 with ABI's and left LE arterial duplex. I advised pt and her son to notify us if she develops concerns re the circulation in her feet or legs.  Daily seated leg exercises to be resumed by and taught by physical therapy. Daily PT that she was receiving prior to the 10-25-17 balloon angioplasty left popliteal artery.  Continue daily Plavix and 81 mg ASA.  Continue daily silvadene dressing to left great toe small shallow ulcer. Continue to float heels off of bed.   I discussed in depth with the patient the nature of atherosclerosis, and emphasized the importance of maximal medical management including strict control of blood pressure, blood glucose, and lipid levels, obtaining regular exercise, and continued cessation of smoking.  The patient is aware that without maximal medical management the underlying atherosclerotic disease process will progress, limiting the benefit of any interventions.  The patient was given information about PAD including signs, symptoms, treatment, what symptoms  should prompt the patient to seek immediate medical care, and risk reduction measures to take.  Charisse March, RN, MSN, FNP-C [facility] [place] Phone: [phone]  Clinic MD: Myra Gianotti  01/03/18 12:22 PM

## 2018-01-03 NOTE — Patient Instructions (Signed)

## 2018-03-06 NOTE — Progress Notes (Deleted)
HISTORY AND PHYSICAL     CC:  follow up. Requesting Provider:  Reynolds Bowl, MD  HPI: This is a 85 y.o. female who is here today for follow up.  On 10/25/17, she underwent the following for a left great toe ulceration with hx of elevated creatinine: 1.Ultrasound-guided cannulation right common femoral artery 2.CO2 aortogram with left lower extremity angiogram 3.Drug-coated balloon angioplasty left popliteal artery 4.Mynx device closure right common femoral artery  She was seen by NP in November 2019 and at that time, she had a palpable left DP pulse and her toe wound was improving.  They were continuing to float her heels off the bed.  She does not walk much but takes a few steps.  She was on Bactrim.  She was seen by ID on 12/27/17 and was  doing well and tolerating chronic suppressive trimethoprim sulfamethoxazole.  They prefer for her to continue taking it as long as she is tolerating it well.  She will follow-up with ID in 6 months.  She was also on asa/plavix.  ***  The pt is not on a statin for cholesterol management.    The pt *** have diabetes. The pt is not on medication for hypertension.   Past Medical History:  Diagnosis Date  . Anemia    blood transfusion during hospitalization of 02/15/2017   . Arthritis   . Chronic kidney disease    CKD- stage III  . Constipation   . Diabetes mellitus type II 02/19/2011   diet controlled   . Dizziness   . E. coli urinary tract infection   . Gout   . Gout spondylitis   . 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 History of dizziness   . History of gout   . Hyperlipidemia   . Hypertension   . Overactive bladder   . Sciatica   . Stroke [facility])     Past Surgical History:  Procedure Laterality Date  . ABDOMINAL AORTOGRAM W/LOWER EXTREMITY N/A 10/25/2017   Procedure: ABDOMINAL AORTOGRAM W/LOWER EXTREMITY;  Surgeon: Maeola Harman, MD;  Location: [facility] INVASIVE CV LAB;  Service:  Cardiovascular;  Laterality: N/A;  . ABDOMINAL HYSTERECTOMY    . HIP PINNING,CANNULATED Right 02/15/2017   Procedure: ANTERIOR APPROACH HEMI HIP ARTHROPLASTY;  Surgeon: Samson Frederic, MD;  Location: [facility] OR;  Service: Orthopedics;  Laterality: Right;  . INCISION AND DRAINAGE HIP Right 04/01/2017   Procedure: DEBRIDEMENT OF RIGHT HIP,  HEAD BALL EXCHANGE;  Surgeon: Samson Frederic, MD;  Location: [facility] ORS;  Service: Orthopedics;  Laterality: Right;  Needs RNFA  . PERIPHERAL VASCULAR BALLOON ANGIOPLASTY Left 10/25/2017   Procedure: PERIPHERAL VASCULAR BALLOON ANGIOPLASTY;  Surgeon: Maeola Harman, MD;  Location: [facility] INVASIVE CV LAB;  Service: Cardiovascular;  Laterality: Left;  popliteal    Allergies  Allergen Reactions  . Bee Venom Swelling  . Penicillins Other (See Comments)    Has patient had a PCN reaction causing immediate rash, facial/tongue/throat swelling, SOB or lightheadedness with hypotension: Unknown Has patient had a PCN reaction causing severe rash involving mucus membranes or skin necrosis: Unknown Has patient had a PCN reaction that required hospitalization: Unknown Has patient had a PCN reaction occurring within the last 10 years: Unknown If all of the above answers are "NO", then may proceed with Cephalosporin use.     Current Outpatient Medications  Medication Sig Dispense Refill  . acetaminophen (TYLENOL) 325 MG tablet Take 2 tablets (650 mg total) by mouth every 4 (four) hours  as needed for mild pain ((score 1 to 3) or temp > 100.5). 120 tablet 0  . allopurinol (ZYLOPRIM) 100 MG tablet Take 100 mg by mouth See admin instructions. Take 100 mg by mouth every Monday and Thursday.    Marland Kitchen. aspirin 81 MG chewable tablet Chew 1 tablet (81 mg total) by mouth 2 (two) times daily with a meal. (Patient taking differently: Chew 81 mg by mouth daily. ) 60 tablet 1  . Calcium-Magnesium-Vitamin D (CALCIUM 500 PO) Take 500 mg by mouth daily.    . Cholecalciferol (VITAMIN D3) 1000  units CAPS Take 4,000 Units by mouth daily.    . clopidogrel (PLAVIX) 75 MG tablet Take 1 tablet (75 mg total) by mouth daily. 30 tablet 2  . diclofenac sodium (VOLTAREN) 1 % GEL Apply 2 g topically 4 (four) times daily.    . feeding supplement, ENSURE ENLIVE, (ENSURE ENLIVE) LIQD Take 237 mLs by mouth 2 (two) times daily between meals. 237 mL 12  . mirtazapine (REMERON) 7.5 MG tablet Take 7.5 mg by mouth at bedtime.    . ondansetron (ZOFRAN) 4 MG tablet Take 1 tablet (4 mg total) by mouth every 6 (six) hours as needed for nausea. 20 tablet 0  . senna-docusate (SENOKOT-S) 8.6-50 MG tablet Take 2 tablets by mouth 2 (two) times daily.    . silver sulfADIAZINE (SILVADENE) 1 % cream     . sulfamethoxazole-trimethoprim (BACTRIM,SEPTRA) 400-80 MG tablet Take 1 tablet by mouth 2 (two) times daily. (Patient taking differently: Take 1 tablet by mouth daily. ) 60 tablet 1  . torsemide (DEMADEX) 10 MG tablet Take 1 tablet (10 mg total) by mouth daily as needed. 30 tablet 0   No current facility-administered medications for this visit.     Family History  Problem Relation Age of Onset  . Stroke Mother   . Stroke Father     Social History   Socioeconomic History  . Marital status: Widowed    Spouse name: Not on file  . Number of children: Not on file  . Years of education: Not on file  . Highest education level: Not on file  Occupational History  . Not on file  Social Needs  . Financial resource strain: Not on file  . Food insecurity:    Worry: Not on file    Inability: Not on file  . Transportation needs:    Medical: Not on file    Non-medical: Not on file  Tobacco Use  . Smoking status: Never Smoker  . Smokeless tobacco: Never Used  Substance and Sexual Activity  . Alcohol use: No  . Drug use: No  . Sexual activity: Not Currently  Lifestyle  . Physical activity:    Days per week: Not on file    Minutes per session: Not on file  . Stress: Not on file  Relationships  . Social  connections:    Talks on phone: Not on file    Gets together: Not on file    Attends religious service: Not on file    Active member of club or organization: Not on file    Attends meetings of clubs or organizations: Not on file    Relationship status: Not on file  . Intimate partner violence:    Fear of current or ex partner: Not on file    Emotionally abused: Not on file    Physically abused: Not on file    Forced sexual activity: Not on file  Other Topics Concern  .  Not on file  Social History Narrative  . Not on file     REVIEW OF SYSTEMS:  *** [X]  denotes positive finding, [ ]  denotes negative finding Cardiac  Comments:  Chest pain or chest pressure:    Shortness of breath upon exertion:    Short of breath when lying flat:    Irregular heart rhythm:        Vascular    Pain in calf, thigh, or hip brought on by ambulation:    Pain in feet at night that wakes you up from your sleep:     Blood clot in your veins:    Leg swelling:         Pulmonary    Oxygen at home:    Productive cough:     Wheezing:         Neurologic    Sudden weakness in arms or legs:     Sudden numbness in arms or legs:     Sudden onset of difficulty speaking or slurred speech:    Temporary loss of vision in one eye:     Problems with dizziness:         Gastrointestinal    Blood in stool:     Vomited blood:         Genitourinary    Burning when urinating:     Blood in urine:        Psychiatric    Major depression:         Hematologic    Bleeding problems:    Problems with blood clotting too easily:        Skin    Rashes or ulcers:        Constitutional    Fever or chills:      PHYSICAL EXAMINATION:  ***  General:  WDWN in NAD; vital signs documented above Gait: Not observed HENT: WNL, normocephalic Pulmonary: normal non-labored breathing , without Rales, rhonchi,  wheezing Cardiac: {Desc; regular/irreg:14544} HR, without  Murmurs; {With/Without:20273} carotid  bruit*** Abdomen: soft, NT, no masses Skin: {With/Without:20273} rashes Vascular Exam/Pulses:  Right Left  Radial {Exam; arterial pulse strength 0-4:30167} {Exam; arterial pulse strength 0-4:30167}  Ulnar {Exam; arterial pulse strength 0-4:30167} {Exam; arterial pulse strength 0-4:30167}  Brachial {Exam; arterial pulse strength 0-4:30167} {Exam; arterial pulse strength 0-4:30167}  Femoral {Exam; arterial pulse strength 0-4:30167} {Exam; arterial pulse strength 0-4:30167}  Popliteal {Exam; arterial pulse strength 0-4:30167} {Exam; arterial pulse strength 0-4:30167}  DP {Exam; arterial pulse strength 0-4:30167} {Exam; arterial pulse strength 0-4:30167}  PT {Exam; arterial pulse strength 0-4:30167} {Exam; arterial pulse strength 0-4:30167}  Peroneal {Exam; arterial pulse strength 0-4:30167} {Exam; arterial pulse strength 0-4:30167}   Extremities: {With/Without:20273} ischemic changes, {With/Without:20273} Gangrene , {With/Without:20273} cellulitis; {With/Without:20273} open wounds;  Musculoskeletal: no muscle wasting or atrophy  Neurologic: A&O X 3;  No focal weakness or paresthesias are detected Psychiatric:  The pt has {Desc; normal/abnormal:11317::"Normal"} affect.   Non-Invasive Vascular Imaging:   ABI's on ***: Right:  *** Left:  ***  LLE Arterial duplex on 11/26/17: Summary: Left: Stenosis in the prox/mid SFA in the 30-49% range and in the mid/distal SFA in the 50-74% range. Peroneal artery not visualized.  Previous ABI's on 11/26/17: Right:  0.61 Left:  0.73  Pt meds includes: Statin:  {yes no:314532} Beta Blocker:  {yes no:314532} Aspirin:  {yes no:314532} ACEI:  {yes no:314532} ARB:  {yes no:314532} CCB use:  {yes/no:20286} Other Antiplatelet/Anticoagulant:  {yes/no:20286}***   ASSESSMENT/PLAN:: 85 y.o. female here for follow up for left  foot ulcers s/p: 1.Ultrasound-guided cannulation right common femoral artery 2.CO2 aortogram with left lower extremity  angiogram 3.Drug-coated balloon angioplasty left popliteal artery 4.Mynx device closure right common femoral artery On 10/25/17 by Dr. Randie Heinz for left foot ulcer   -***   Doreatha Massed, PA-C [facility] [phone]  Clinic MD:   ***

## 2018-03-08 ENCOUNTER — Encounter (HOSPITAL_COMMUNITY): Payer: Medicare Other

## 2018-03-08 ENCOUNTER — Ambulatory Visit: Payer: Medicare Other | Admitting: Physician Assistant

## 2018-03-08 ENCOUNTER — Encounter: Payer: Self-pay | Admitting: Physician Assistant

## 2018-04-08 ENCOUNTER — Other Ambulatory Visit: Payer: Self-pay

## 2018-04-08 DIAGNOSIS — I739 Peripheral vascular disease, unspecified: Secondary | ICD-10-CM

## 2018-04-08 DIAGNOSIS — I779 Disorder of arteries and arterioles, unspecified: Secondary | ICD-10-CM

## 2018-04-13 ENCOUNTER — Ambulatory Visit (INDEPENDENT_AMBULATORY_CARE_PROVIDER_SITE_OTHER)
Admission: RE | Admit: 2018-04-13 | Discharge: 2018-04-13 | Disposition: A | Discharge status: Home / Self Care | Payer: Medicare Other | Source: Ambulatory Visit | Attending: Family | Admitting: Family

## 2018-04-13 ENCOUNTER — Ambulatory Visit (HOSPITAL_COMMUNITY)
Admission: RE | Admit: 2018-04-13 | Discharge: 2018-04-13 | Disposition: A | Discharge status: Home / Self Care | Payer: Medicare Other | Source: Ambulatory Visit | Attending: Family | Admitting: Family

## 2018-04-13 ENCOUNTER — Ambulatory Visit (INDEPENDENT_AMBULATORY_CARE_PROVIDER_SITE_OTHER): Payer: Medicare Other | Admitting: Physician Assistant

## 2018-04-13 ENCOUNTER — Other Ambulatory Visit: Payer: Self-pay

## 2018-04-13 DIAGNOSIS — L97521 Non-pressure chronic ulcer of other part of left foot limited to breakdown of skin: Secondary | ICD-10-CM | POA: Diagnosis not present

## 2018-04-13 DIAGNOSIS — I739 Peripheral vascular disease, unspecified: Secondary | ICD-10-CM

## 2018-04-13 DIAGNOSIS — L97509 Non-pressure chronic ulcer of other part of unspecified foot with unspecified severity: Secondary | ICD-10-CM | POA: Insufficient documentation

## 2018-04-13 DIAGNOSIS — I779 Disorder of arteries and arterioles, unspecified: Secondary | ICD-10-CM

## 2018-04-13 NOTE — Progress Notes (Signed)
Established PAD Patient   History of Present Illness   [name] [name] is a 85 y.o. (1917/05/28) female who presents with chief complaint: Peripheral arterial disease with wounds of both feet.  Patient is status post L SFA/popliteal drug-coated balloon angioplasty by Dr. Randie Heinz 10/2017.  She had healed her left great toe ulcer as well as her left heel ulcer when she was seen 6 weeks postoperatively.  Since last office visit the son who is present during exam today states she was hospitalized twice and her activity and functional mobility has rapidly declined since then.  He also states that she nearly lost her life during one hospital admission but is unable to further explain.  She spends most of her days in bed and is no longer able to stand without near full support.  She currently resides at the [facility] facility.  On exam she is wearing foam boots to help with bilateral heel ulcerations.  She denies any rest pain.  She states she is receiving wound care of bilateral heels.  She is unsure of her current medication regimen however listed on her medication list is aspirin and Plavix.  The patient's PMH, PSH, SH, and FamHx were reviewed and are unchanged from prior office visit.  Current Outpatient Medications  Medication Sig Dispense Refill  . acetaminophen (TYLENOL) 325 MG tablet Take 2 tablets (650 mg total) by mouth every 4 (four) hours as needed for mild pain ((score 1 to 3) or temp > 100.5). 120 tablet 0  . Calcium-Magnesium-Vitamin D (CALCIUM 500 PO) Take 500 mg by mouth daily.    . Cholecalciferol (VITAMIN D3) 1000 units CAPS Take 4,000 Units by mouth daily.    . diclofenac sodium (VOLTAREN) 1 % GEL Apply 2 g topically 4 (four) times daily.    . feeding supplement, ENSURE ENLIVE, (ENSURE ENLIVE) LIQD Take 237 mLs by mouth 2 (two) times daily between meals. 237 mL 12  . LORazepam (ATIVAN) 2 MG/ML concentrated solution Take by mouth every 4 (four) hours as needed for anxiety.      . mirtazapine (REMERON) 7.5 MG tablet Take 7.5 mg by mouth at bedtime.    . ondansetron (ZOFRAN) 4 MG tablet Take 1 tablet (4 mg total) by mouth every 6 (six) hours as needed for nausea. 20 tablet 0  . senna-docusate (SENOKOT-S) 8.6-50 MG tablet Take 2 tablets by mouth 2 (two) times daily.    . silver sulfADIAZINE (SILVADENE) 1 % cream     . sulfamethoxazole-trimethoprim (BACTRIM,SEPTRA) 400-80 MG tablet Take 1 tablet by mouth 2 (two) times daily.    Marland Kitchen torsemide (DEMADEX) 10 MG tablet Take 1 tablet (10 mg total) by mouth daily as needed. 30 tablet 0  . allopurinol (ZYLOPRIM) 100 MG tablet Take 100 mg by mouth See admin instructions. Take 100 mg by mouth every Monday and Thursday.    Marland Kitchen aspirin 81 MG chewable tablet Chew 1 tablet (81 mg total) by mouth 2 (two) times daily with a meal. (Patient not taking: Reported on 04/13/2018) 60 tablet 1  . clopidogrel (PLAVIX) 75 MG tablet Take 1 tablet (75 mg total) by mouth daily. (Patient not taking: Reported on 04/13/2018) 30 tablet 2   No current facility-administered medications for this visit.     On ROS today: 10 system ROS is negative unless otherwise noted in HPI   Physical Examination   Vitals:   04/13/18 1500  BP: (!) 116/43  Pulse: (!) 50  Resp: 20  Temp: Marland Kitchen)  97.1 F (36.2 C)  SpO2: 97%  Weight: 113 lb (51.3 kg)  Height: 4\' 10"  (1.473 m)   Body mass index is 23.62 kg/m.  General Alert, O x 3, WD, NAD  Pulmonary Sym exp, good B air movt  Cardiac RRR, Nl S1, S2  Vascular Vessel Right Left  Radial Palpable Palpable  Aorta Not palpable N/A  Popliteal Not palpable Not palpable  PT Not palpable Not palpable  DP Not palpable Not palpable    Musculo- skeletal M/S 5/5 throughout  , Bilateral heel ulcerations without exposed bone; left small great toe ulceration dry, no drainage  Neurologic A&O    Non-Invasive Vascular Imaging   ABI  ABI/TBIToday's ABIToday's TBIPrevious ABIPrevious  TBI +-------+-----------+-----------+------------+------------+ Right  0.56       0.17       0.61        0.56         +-------+-----------+-----------+------------+------------+ Left   0.62       0.40       0.73        ulcer         Left distal SFA 423 cm/s consistent with 50 to 74% stenosis   Medical Decision Making   [name] [name] is a 85 y.o. female who presents with left great toe ulceration as well as bilateral heel ulcerations   ABI of left lower extremity has decreased since prior imaging and duplex demonstrates a recurrent stenosis of left distal SFA and proximal popliteal  She has bilateral heel ulcerations and nonhealing left great toe ulceration  Since last office visit her son explains that she is lost a tremendous amount of functional mobility and is mainly bedridden  Given her drastic change in mobility and debilitated state including inability to stand under her own weight, she does not seem to be a candidate for any further revascularization.  Patient and son agree and do not want to pursue any aggressive treatment options  I encouraged daily cleansing of left great toe with soap and water and stressed importance of avoiding contact between heels and bedding  I will have her return in about 4 to 6 weeks to recheck her wounds and further discuss treatment plan with Dr. Whitman Hero PA-C [facility] Office: [phone]  Clinic MD: This case was discussed in office with Dr. Darrick Penna, who assisted in treatment plan

## 2018-05-13 ENCOUNTER — Ambulatory Visit: Payer: Medicare Other | Admitting: Vascular Surgery

## 2018-06-17 DEATH — deceased

## 2018-06-30 ENCOUNTER — Ambulatory Visit: Payer: Medicare Other | Admitting: Internal Medicine

## 2018-08-16 IMAGING — US US RENAL
1 series · 14 of 25 positions shown · non-contrast
Comparison: None.

CLINICAL DATA: Acute renal failure on chronic kidney disease
history of hypertension

EXAM:
RENAL / URINARY TRACT ULTRASOUND COMPLETE

[Series 1: us renal · 0.23mm/px · 14 of 27 slices shown]
[im 1/27]
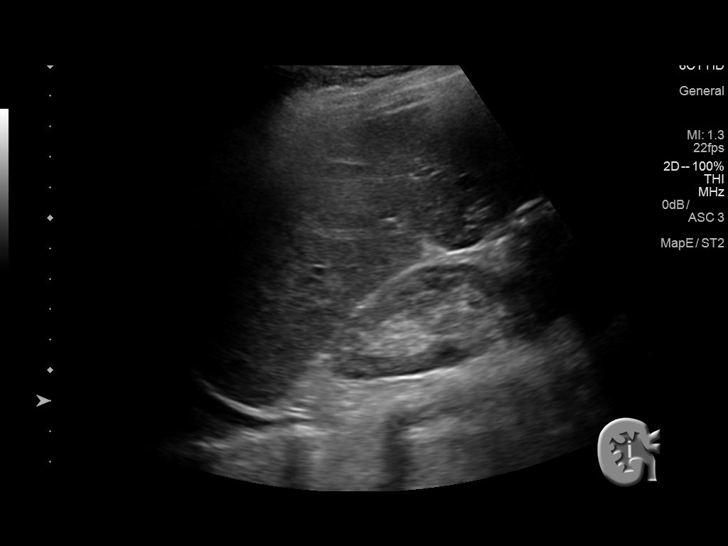
[im 3/27]
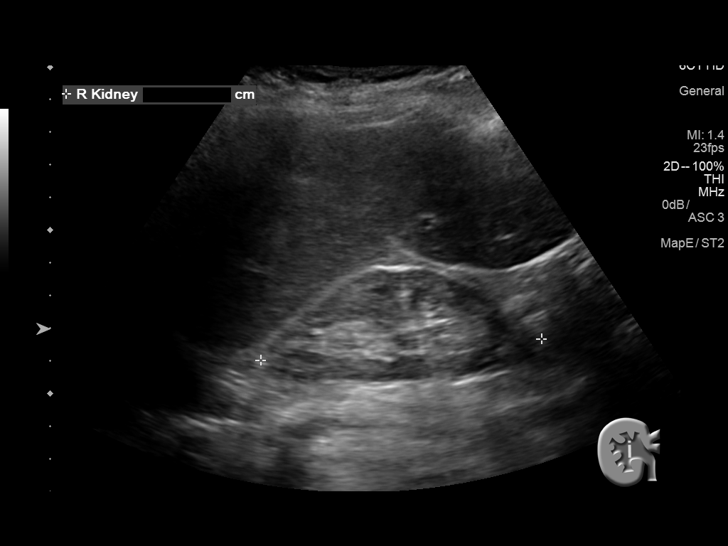
[im 5/27]
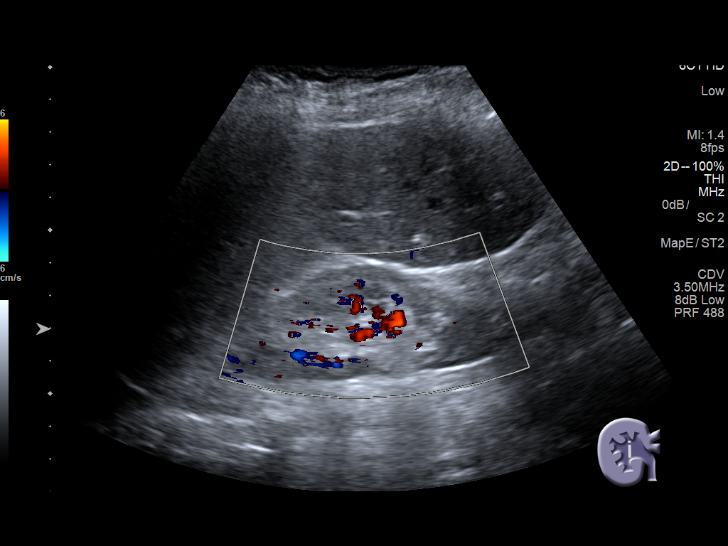
[im 7/27]
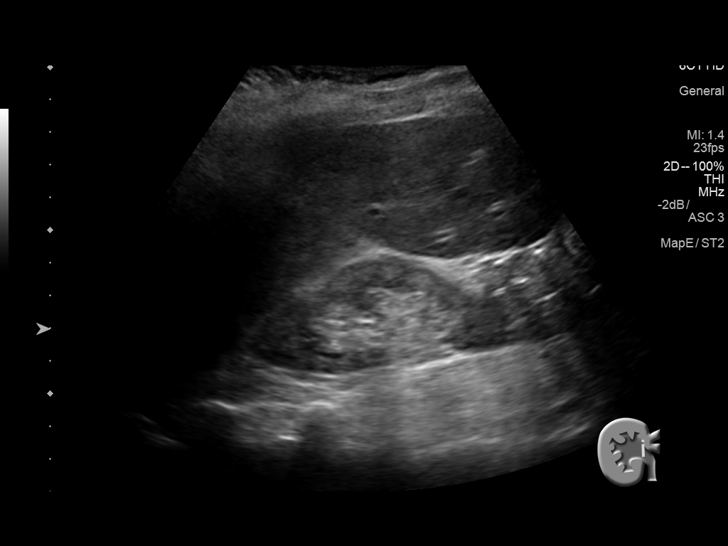
[im 9/27]
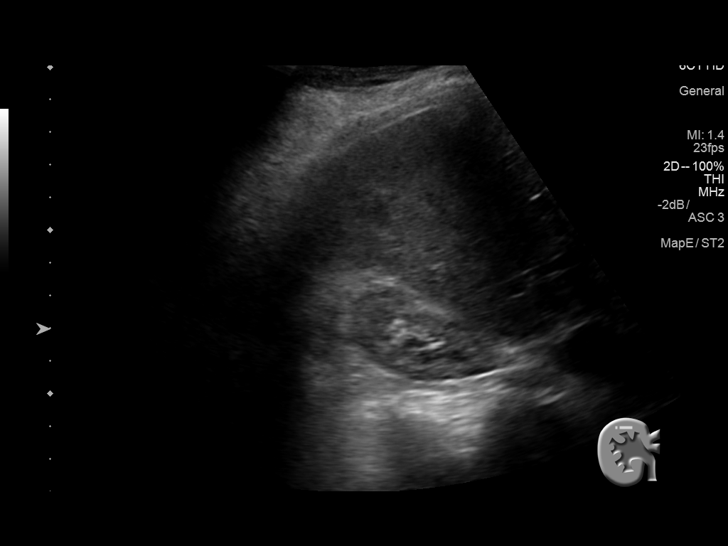
[im 10/27]
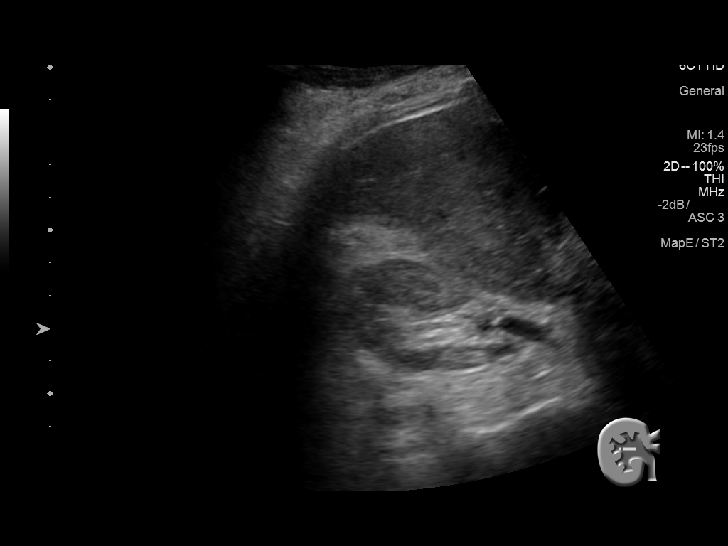
[im 12/27]
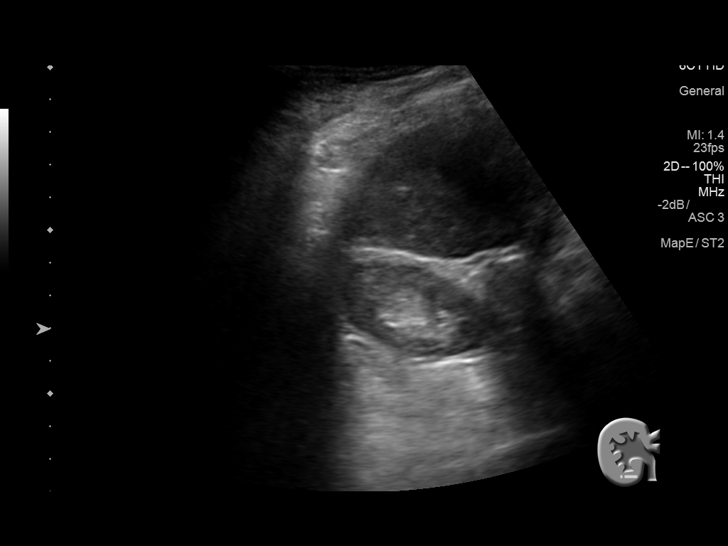
[im 15/27]
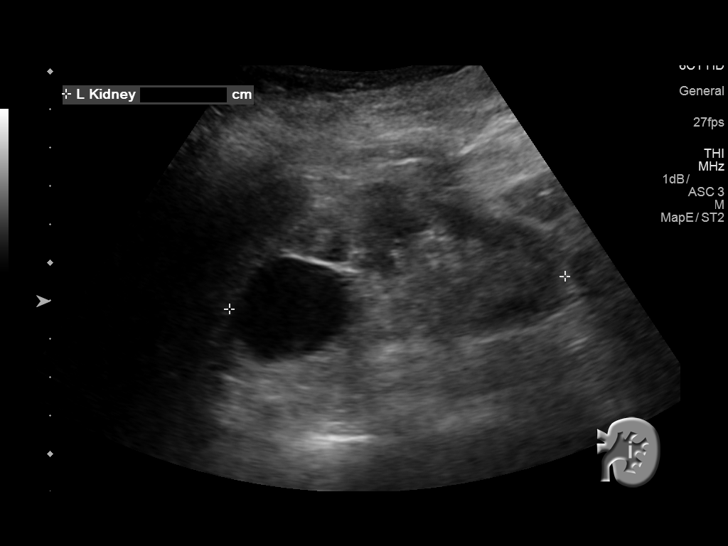
[im 17/27]
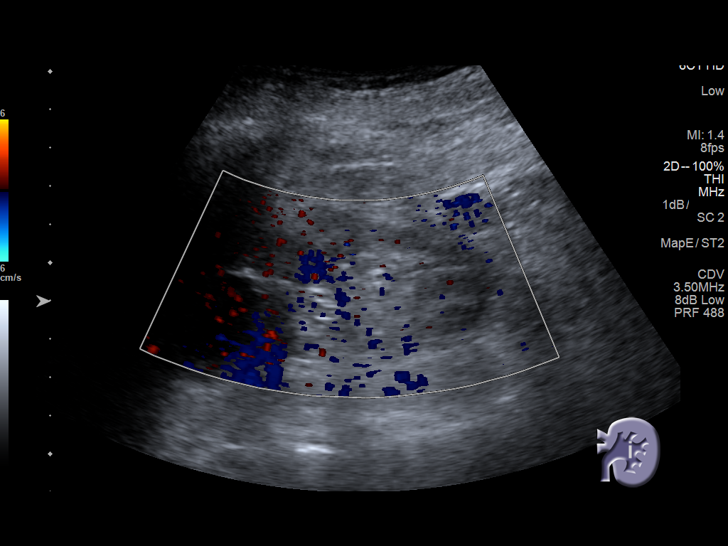
[im 18/27]
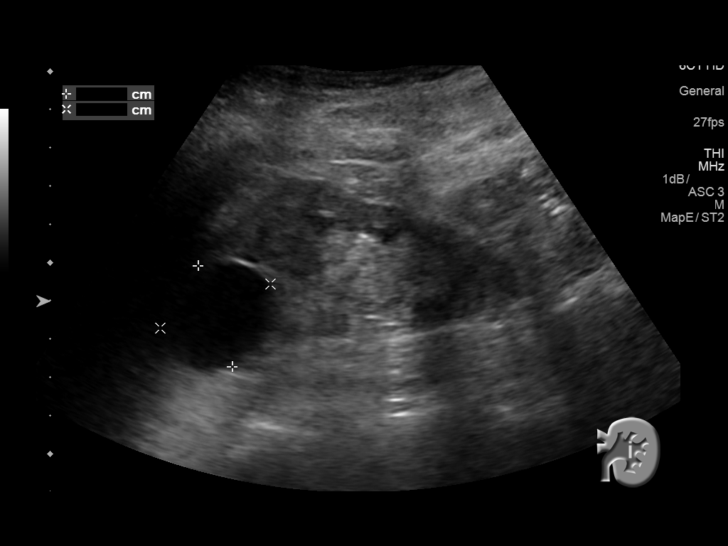
[im 20/27]
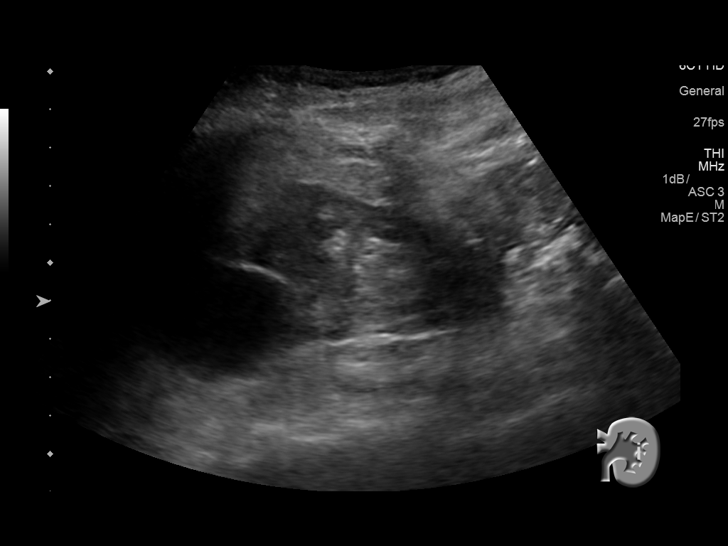
[im 22/27]
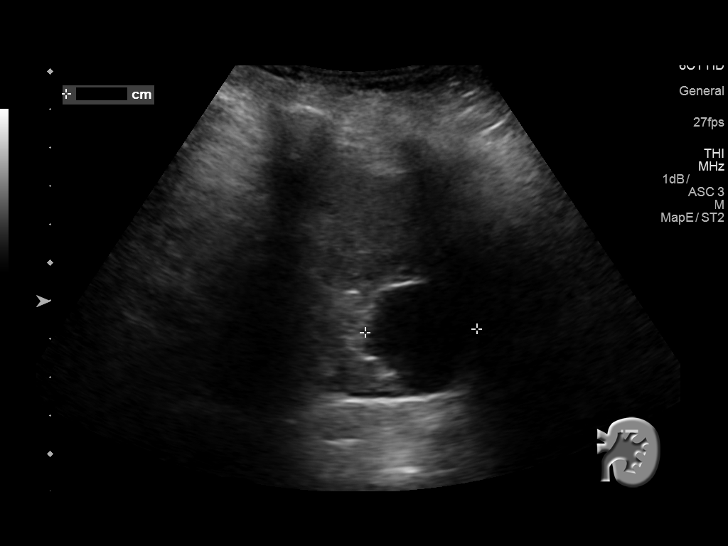
[im 24/27]
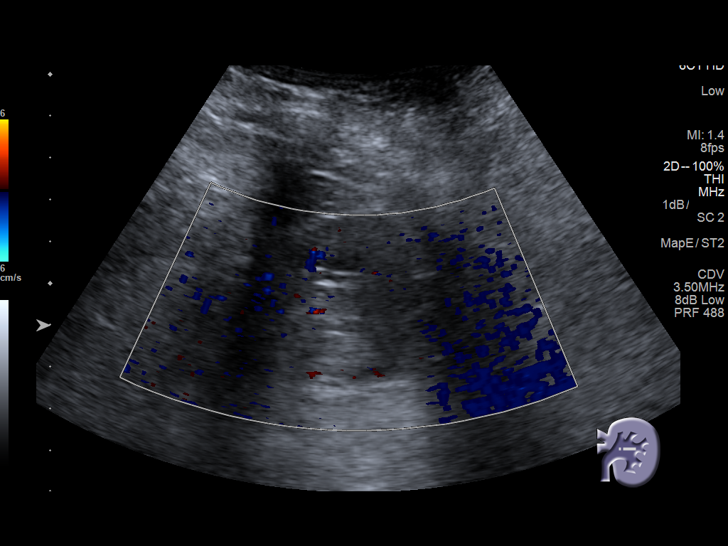
[im 27/27]
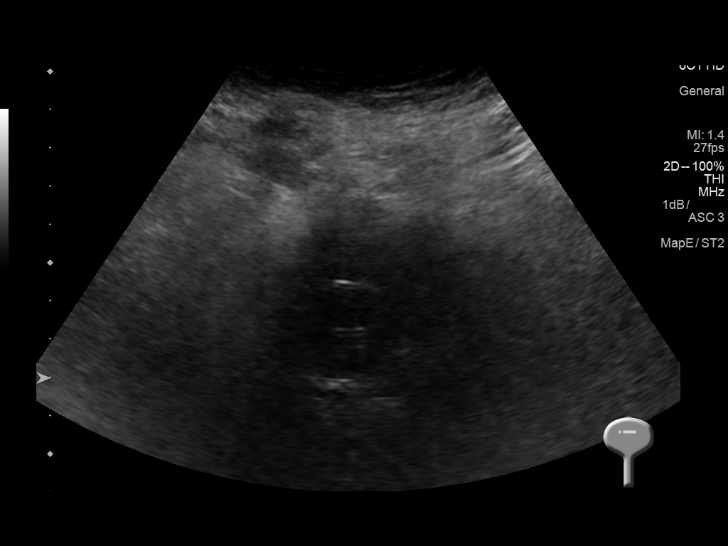

[14 of 25 positions shown; findings below may reference images not displayed]

FINDINGS: Right Kidney:

Length: 8.6 cm. Increased echogenicity. No hydronephrosis or focal
abnormality

Left Kidney:

Length: 8.8 cm. Increased echogenicity. No hydronephrosis. Cyst in
the upper pole measuring 2.8 x 3.1 x 2.9 cm

Bladder:

Foley catheter in the bladder
IMPRESSION: 1. Echogenic kidneys consistent with medical renal disease. No
hydronephrosis
2. Cyst in the left kidney

## 2019-01-01 IMAGING — DX DG PORTABLE PELVIS
2 series · 2 of 2 positions shown · non-contrast
Comparison: 02/15/2017

CLINICAL DATA: Status post right hip revision

EXAM:
PORTABLE PELVIS 1-2 VIEWS

[pelvis ap (1 of 2)]
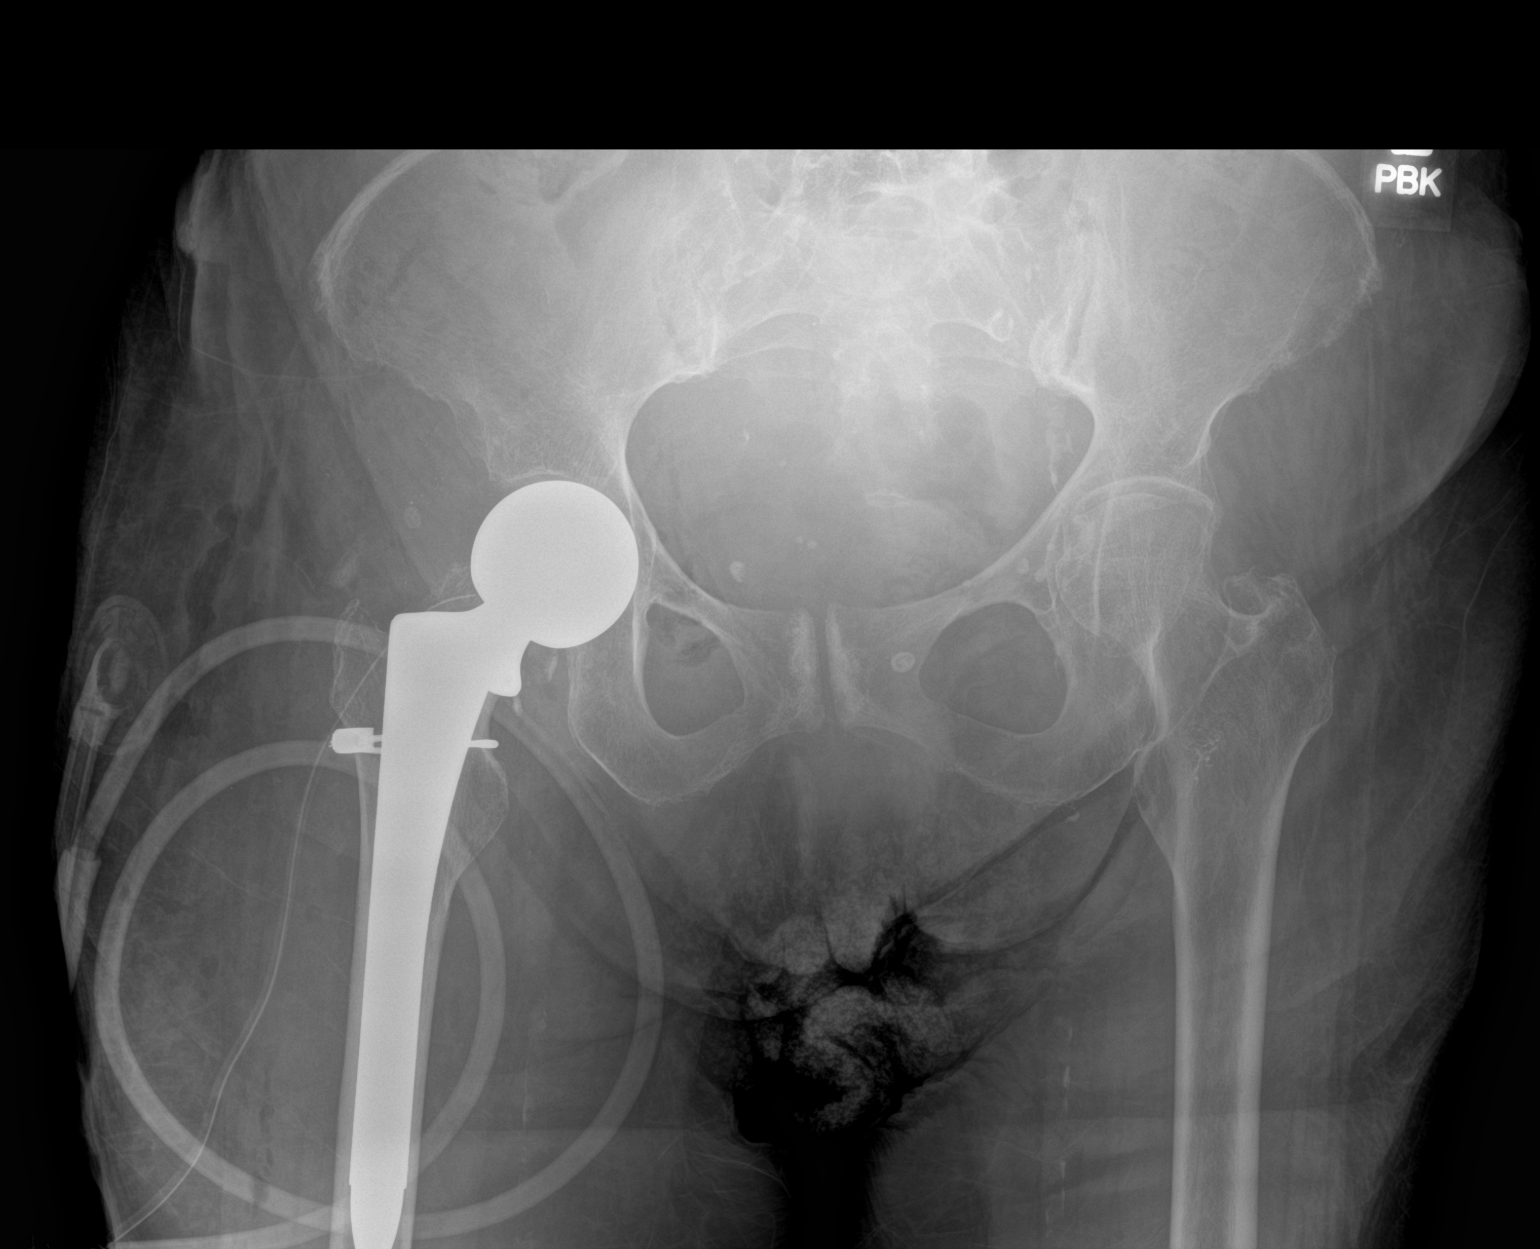

[pelvis ap (2 of 2)]
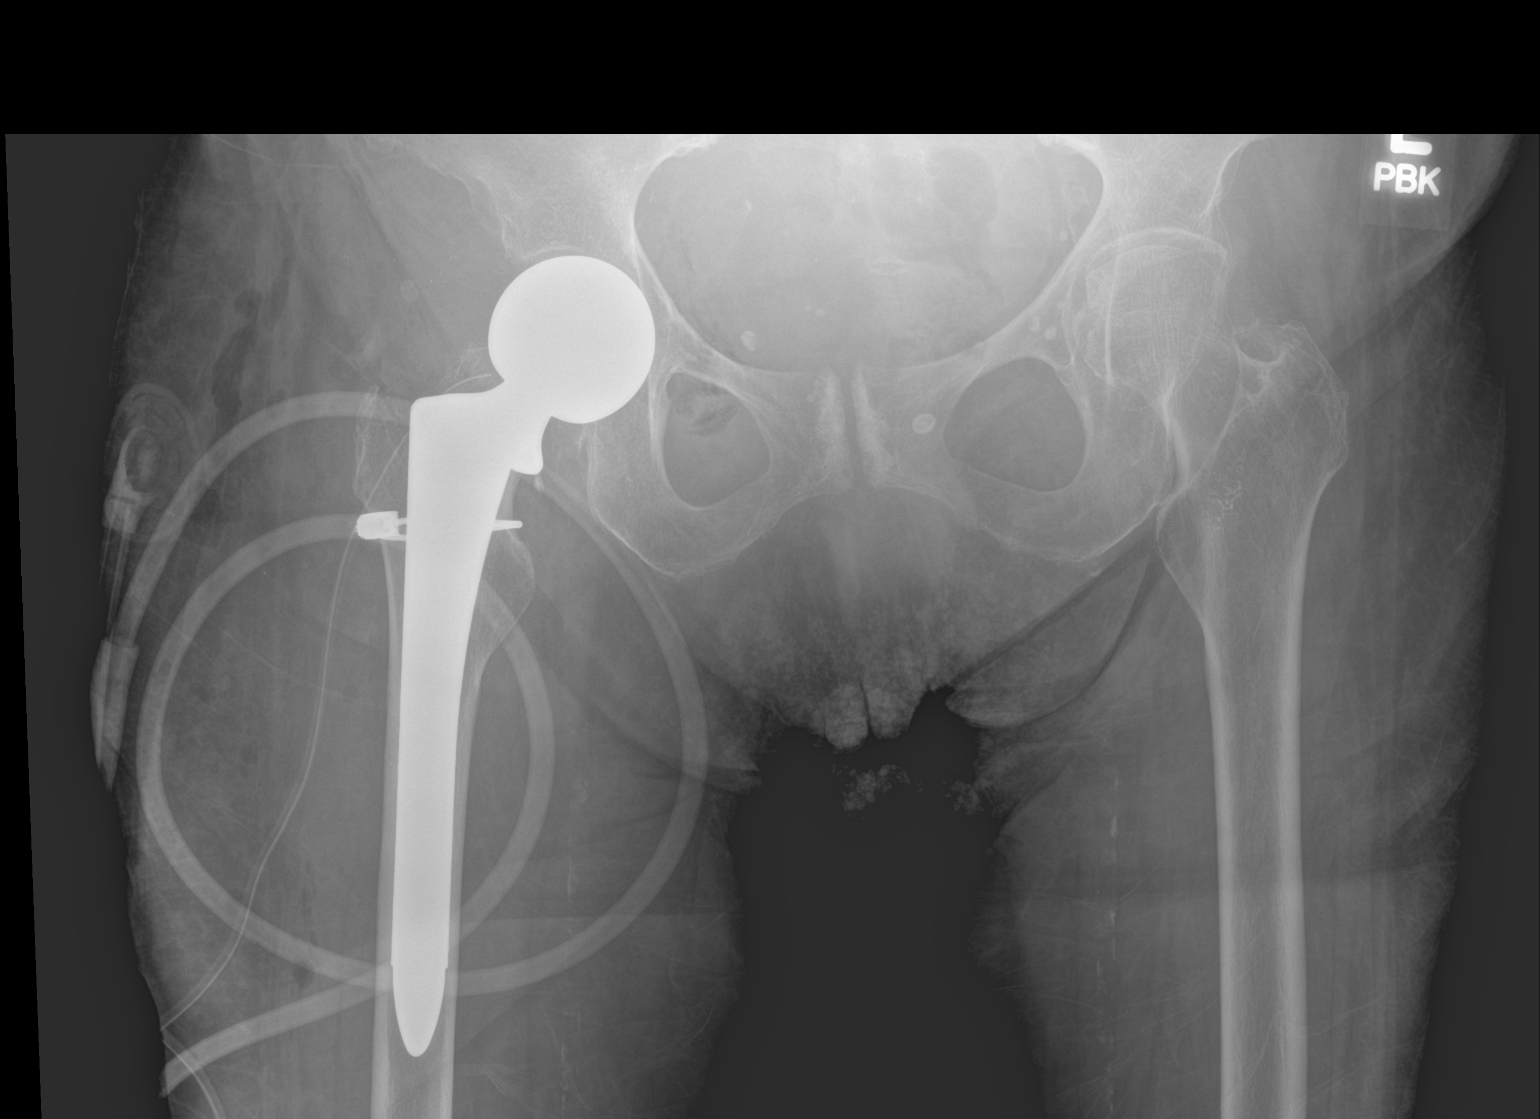

[2 of 2 positions shown; findings below may reference images not displayed]

FINDINGS: Right femoral prosthesis is noted. The head is well seated within
the acetabulum. Surgical drain is noted. No soft tissue changes are
noted.
IMPRESSION: No acute abnormality noted.

## 2019-01-01 IMAGING — RF DG HIP (WITH PELVIS) OPERATIVE*R*
1 series · 1 of 1 positions shown · non-contrast
Comparison: 02/15/2017

CLINICAL DATA: Exchange of femoral ball component of the prosthesis

EXAM:
OPERATIVE RIGHT HIP WITH PELVIS

[Series 1: run · 1 of 1 slices shown]
[im 1/1]
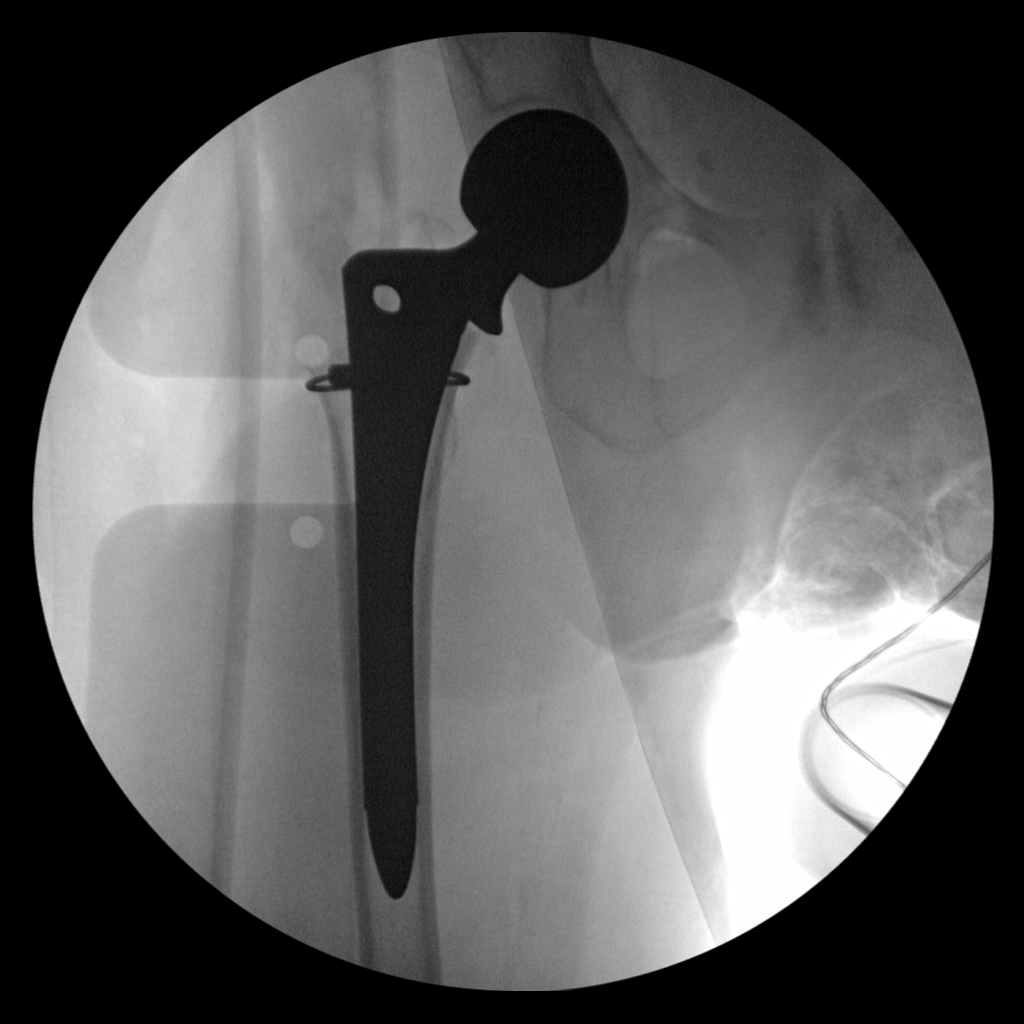

[1 of 1 positions shown; findings below may reference images not displayed]

FLUOROSCOPY TIME:  Radiation Exposure Index (as provided by the
fluoroscopic device): Not available

If the device does not provide the exposure index:

Fluoroscopy Time:  7 seconds

Number of Acquired Images:  1
FINDINGS: Right hip prosthesis is noted well seated within the acetabulum. No
acute bony abnormality is noted.
IMPRESSION: Right hip prosthesis revision

## 2019-01-06 IMAGING — DX DG CHEST 1V PORT
1 series · 1 of 1 positions shown · non-contrast
Comparison: 02/14/2017

CLINICAL DATA: Cough.

EXAM:
PORTABLE CHEST 1 VIEW

[chest ap]
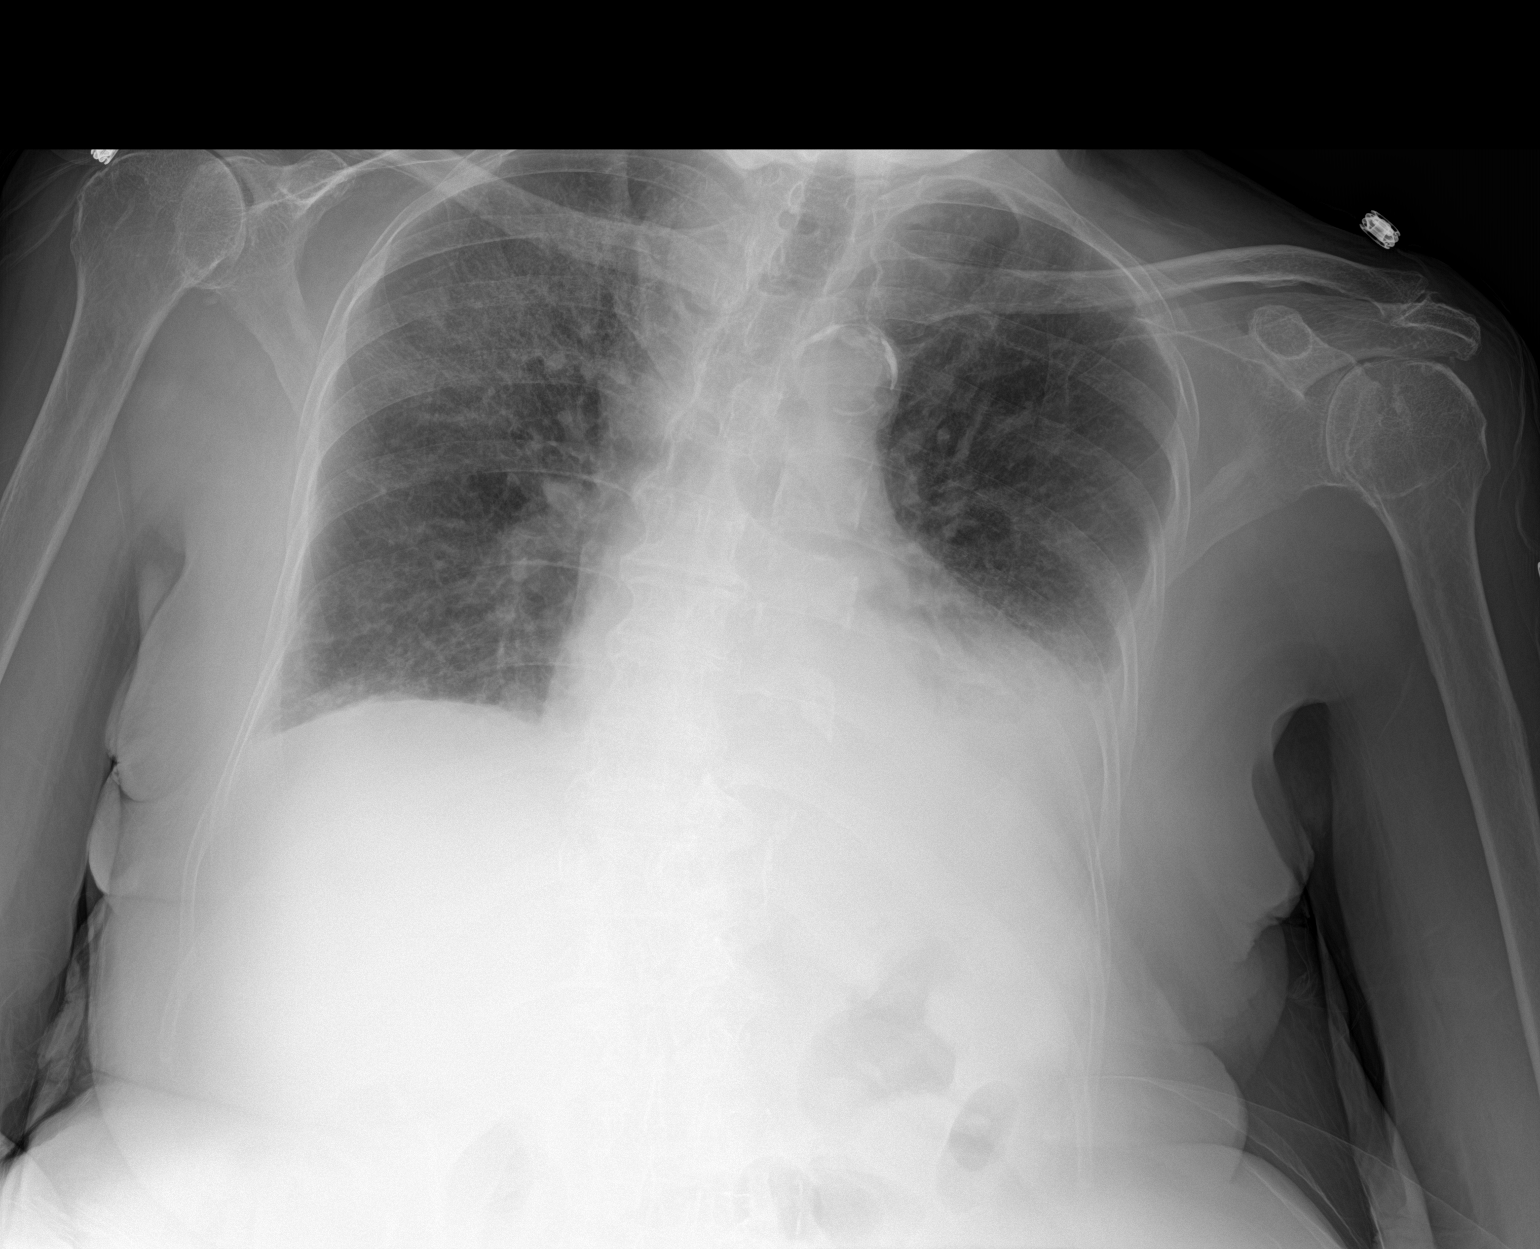

[1 of 1 positions shown; findings below may reference images not displayed]

FINDINGS: Enlarged cardiac silhouette. Calcific atherosclerotic disease and
tortuosity of the aorta. Mediastinal contours appear intact.

Small right and moderate left pleural effusions. Mild prominence of
the interstitium. Left lower lobe airspace consolidation cannot be
excluded.

Osseous structures are without acute abnormality. Soft tissues are
grossly normal.
IMPRESSION: Bilateral pleural effusions, moderate on the left, new from the
prior radiograph dated 02/14/2017.

Enlarged heart with mild pulmonary vascular congestion.

Left lower lobe airspace consolidation cannot be excluded.
